# Patient Record
Sex: Female | Born: 1950 | Race: White | Hispanic: No | Marital: Married | Smoking: Never smoker
Health system: Southern US, Community
[De-identification: ages and names within clinical notes are randomized; demographics above are authoritative.]

## PROBLEM LIST (undated history)

## (undated) DIAGNOSIS — I517 Cardiomegaly: Secondary | ICD-10-CM

## (undated) DIAGNOSIS — R002 Palpitations: Secondary | ICD-10-CM

## (undated) DIAGNOSIS — R011 Cardiac murmur, unspecified: Secondary | ICD-10-CM

## (undated) DIAGNOSIS — F329 Major depressive disorder, single episode, unspecified: Secondary | ICD-10-CM

## (undated) DIAGNOSIS — Z8249 Family history of ischemic heart disease and other diseases of the circulatory system: Secondary | ICD-10-CM

## (undated) DIAGNOSIS — E785 Hyperlipidemia, unspecified: Secondary | ICD-10-CM

## (undated) DIAGNOSIS — S62101A Fracture of unspecified carpal bone, right wrist, initial encounter for closed fracture: Secondary | ICD-10-CM

## (undated) DIAGNOSIS — I341 Nonrheumatic mitral (valve) prolapse: Secondary | ICD-10-CM

## (undated) DIAGNOSIS — Z803 Family history of malignant neoplasm of breast: Secondary | ICD-10-CM

## (undated) DIAGNOSIS — M8580 Other specified disorders of bone density and structure, unspecified site: Secondary | ICD-10-CM

## (undated) DIAGNOSIS — M419 Scoliosis, unspecified: Secondary | ICD-10-CM

## (undated) DIAGNOSIS — Z923 Personal history of irradiation: Secondary | ICD-10-CM

## (undated) DIAGNOSIS — T7840XA Allergy, unspecified, initial encounter: Secondary | ICD-10-CM

## (undated) DIAGNOSIS — Q676 Pectus excavatum: Secondary | ICD-10-CM

## (undated) DIAGNOSIS — C50919 Malignant neoplasm of unspecified site of unspecified female breast: Secondary | ICD-10-CM

## (undated) HISTORY — DX: Cardiac murmur, unspecified: R01.1

## (undated) HISTORY — DX: Family history of malignant neoplasm of breast: Z80.3

## (undated) HISTORY — DX: Nonrheumatic mitral (valve) prolapse: I34.1

## (undated) HISTORY — DX: Other specified disorders of bone density and structure, unspecified site: M85.80

## (undated) HISTORY — DX: Personal history of irradiation: Z92.3

## (undated) HISTORY — DX: Allergy, unspecified, initial encounter: T78.40XA

## (undated) HISTORY — DX: Pectus excavatum: Q67.6

## (undated) HISTORY — DX: Palpitations: R00.2

## (undated) HISTORY — DX: Fracture of unspecified carpal bone, right wrist, initial encounter for closed fracture: S62.101A

## (undated) HISTORY — DX: Cardiomegaly: I51.7

## (undated) HISTORY — DX: Hyperlipidemia, unspecified: E78.5

## (undated) HISTORY — DX: Malignant neoplasm of unspecified site of unspecified female breast: C50.919

## (undated) HISTORY — PX: SPINE SURGERY: SHX786

## (undated) HISTORY — DX: Family history of ischemic heart disease and other diseases of the circulatory system: Z82.49

## (undated) HISTORY — DX: Major depressive disorder, single episode, unspecified: F32.9

## (undated) HISTORY — DX: Scoliosis, unspecified: M41.9

## (undated) HISTORY — PX: OTHER SURGICAL HISTORY: SHX169

---

## 1979-06-15 DIAGNOSIS — Z9289 Personal history of other medical treatment: Secondary | ICD-10-CM | POA: Insufficient documentation

## 1979-06-15 HISTORY — DX: Personal history of other medical treatment: Z92.89

## 1991-06-15 HISTORY — PX: TUBAL LIGATION: SHX77

## 1998-09-16 ENCOUNTER — Other Ambulatory Visit: Admission: RE | Admit: 1998-09-16 | Discharge: 1998-09-16 | Payer: Self-pay | Admitting: *Deleted

## 1998-12-12 ENCOUNTER — Other Ambulatory Visit: Admission: RE | Admit: 1998-12-12 | Discharge: 1998-12-12 | Payer: Self-pay | Admitting: *Deleted

## 1999-10-13 ENCOUNTER — Other Ambulatory Visit: Admission: RE | Admit: 1999-10-13 | Discharge: 1999-10-13 | Payer: Self-pay | Admitting: *Deleted

## 1999-12-21 ENCOUNTER — Other Ambulatory Visit: Admission: RE | Admit: 1999-12-21 | Discharge: 1999-12-21 | Payer: Self-pay | Admitting: *Deleted

## 2000-01-07 ENCOUNTER — Other Ambulatory Visit: Admission: RE | Admit: 2000-01-07 | Discharge: 2000-01-07 | Payer: Self-pay | Admitting: *Deleted

## 2000-04-25 ENCOUNTER — Other Ambulatory Visit: Admission: RE | Admit: 2000-04-25 | Discharge: 2000-04-25 | Payer: Self-pay | Admitting: *Deleted

## 2000-10-26 ENCOUNTER — Other Ambulatory Visit: Admission: RE | Admit: 2000-10-26 | Discharge: 2000-10-26 | Payer: Self-pay | Admitting: *Deleted

## 2000-12-05 ENCOUNTER — Encounter (INDEPENDENT_AMBULATORY_CARE_PROVIDER_SITE_OTHER): Payer: Self-pay

## 2000-12-05 ENCOUNTER — Other Ambulatory Visit: Admission: RE | Admit: 2000-12-05 | Discharge: 2000-12-05 | Payer: Self-pay | Admitting: *Deleted

## 2001-10-30 ENCOUNTER — Other Ambulatory Visit: Admission: RE | Admit: 2001-10-30 | Discharge: 2001-10-30 | Payer: Self-pay | Admitting: *Deleted

## 2002-10-22 ENCOUNTER — Other Ambulatory Visit: Admission: RE | Admit: 2002-10-22 | Discharge: 2002-10-22 | Payer: Self-pay | Admitting: *Deleted

## 2003-11-14 ENCOUNTER — Other Ambulatory Visit: Admission: RE | Admit: 2003-11-14 | Discharge: 2003-11-14 | Payer: Self-pay | Admitting: *Deleted

## 2004-12-14 ENCOUNTER — Other Ambulatory Visit: Admission: RE | Admit: 2004-12-14 | Discharge: 2004-12-14 | Payer: Self-pay | Admitting: *Deleted

## 2005-06-14 HISTORY — PX: CERVICAL DISC SURGERY: SHX588

## 2005-06-14 HISTORY — PX: OTHER SURGICAL HISTORY: SHX169

## 2006-01-27 ENCOUNTER — Other Ambulatory Visit: Admission: RE | Admit: 2006-01-27 | Discharge: 2006-01-27 | Payer: Self-pay | Admitting: *Deleted

## 2007-02-28 ENCOUNTER — Other Ambulatory Visit: Admission: RE | Admit: 2007-02-28 | Discharge: 2007-02-28 | Payer: Self-pay | Admitting: *Deleted

## 2011-06-15 HISTORY — PX: COLONOSCOPY: SHX174

## 2012-12-07 ENCOUNTER — Encounter: Payer: Self-pay | Admitting: *Deleted

## 2012-12-11 ENCOUNTER — Ambulatory Visit (INDEPENDENT_AMBULATORY_CARE_PROVIDER_SITE_OTHER): Payer: BC Managed Care – PPO | Admitting: Certified Nurse Midwife

## 2012-12-11 ENCOUNTER — Encounter: Payer: Self-pay | Admitting: Certified Nurse Midwife

## 2012-12-11 VITALS — BP 98/60 | HR 60 | Resp 16 | Ht 67.75 in | Wt 146.0 lb

## 2012-12-11 DIAGNOSIS — Z0000 Encounter for general adult medical examination without abnormal findings: Secondary | ICD-10-CM

## 2012-12-11 DIAGNOSIS — N63 Unspecified lump in unspecified breast: Secondary | ICD-10-CM

## 2012-12-11 DIAGNOSIS — Z01419 Encounter for gynecological examination (general) (routine) without abnormal findings: Secondary | ICD-10-CM

## 2012-12-11 LAB — POCT URINALYSIS DIPSTICK
Bilirubin, UA: NEGATIVE
Blood, UA: NEGATIVE
Nitrite, UA: NEGATIVE
Protein, UA: NEGATIVE
pH, UA: 5

## 2012-12-11 NOTE — Patient Instructions (Addendum)

## 2012-12-11 NOTE — Progress Notes (Signed)
62 y.o. [id number] Married Caucasian Fe here for annual exam.  Menopausal no HRT. Denies vaginal bleeding. Has noticed vaginal dryness has increased, using OTC lubrication. Saw cardiologist 2/14, MVP has not changed, 2-3 year follow-up suggested. Plans PCP visit for labs and aex.  No health issues today, except has area on Left breast that she has noticed over the past 2 months. Had mammogram in 2/14, negative per patient. Denies nipple discharge or skin change.  Patient's last menstrual period was 06/15/2003.          Sexually active: yes  The current method of family planning is tubal ligation.    Exercising: yes  walking & weight training Smoker:  no  Health Maintenance: Pap: 12-09-11 neg HPV HR neg MMG:  2/14 Colonoscopy:  6/13 BMD:   2010 TDaP:  2011 Labs: Poct urine-neg, hgb-14.3 Self breast exam: monthly   reports that she has never smoked. She does not have any smokeless tobacco history on file. She reports that she drinks about 3.6 ounces of alcohol per week. She reports that she does not use illicit drugs.  History reviewed. No pertinent past medical history.  History reviewed. No pertinent past surgical history.  Current Outpatient Prescriptions  Medication Sig Dispense Refill  . CALCIUM PO Take by mouth daily.      . Cholecalciferol (VITAMIN D PO) Take by mouth daily.      . Coenzyme Q10 (CO Q 10 PO) Take by mouth daily.      Marland Kitchen LORazepam (ATIVAN) 0.5 MG tablet as needed. Take 1/2 prn      . Multiple Vitamins-Minerals (MULTIVITAMIN PO) Take by mouth every other day.      . Omega-3 Fatty Acids (FISH OIL PO) Take by mouth.       No current facility-administered medications for this visit.    Family History  Problem Relation Age of Onset  . Hypertension Mother   . Mitral valve prolapse Mother   . Hypertension Father     ROS:  Pertinent items are noted in HPI.  Otherwise, a comprehensive ROS was negative.  Exam:   BP 98/60  Pulse 60  Resp 16  Ht 5' 7.75" (1.721 m)   Wt 146 lb (66.225 kg)  BMI 22.36 kg/m2  LMP 06/15/2003 Height: 5' 7.75" (172.1 cm)  Ht Readings from Last 3 Encounters:  12/11/12 5' 7.75" (1.721 m)    General appearance: alert, cooperative and appears stated age Head: Normocephalic, without obvious abnormality, atraumatic Neck: no adenopathy, supple, symmetrical, trachea midline and thyroid normal to inspection and palpation Lungs: clear to auscultation bilaterally Breasts: normal appearance, no masses or tenderness, No nipple retraction or dimpling, No nipple discharge or bleeding, No axillary or supraclavicular adenopathy on right, left breast no nipple discharge or skin change, 1 cm mobile soft mass noted @ 7 o'clock at 3 fb from areola and at breast edge also, non tender, cystic feel. No enlarge lymph nodes noted. Area also examined by Dr. Edward Jolly agreed with finding and need for evaluation with diagnostic mammogram and [place]. Heart: regular rate and rhythm Abdomen: soft, non-tender; no masses,  no organomegaly Extremities: extremities normal, atraumatic, no cyanosis or edema Skin: Skin color, texture, turgor normal. No rashes or lesions Lymph nodes: Cervical, supraclavicular, and axillary nodes normal. No abnormal inguinal nodes palpated Neurologic: Grossly normal   Pelvic: External genitalia:  no lesions              Urethra:  normal appearing urethra with no masses, tenderness or lesions  Bartholin's and Skene's: normal                 Vagina: normal appearing vagina with normal color and discharge, no lesions              Cervix: normal, non tender              Pap taken: no Bimanual Exam:  Uterus:  normal size, contour, position, consistency, mobility, non-tender and anteflexed              Adnexa: normal adnexa and no mass, fullness, tenderness               Rectovaginal: Confirms               Anus:  normal sphincter tone, no lesions  A:  Well Woman with normal exam  Menopausal,no HRT  Left breast mass ?  Cyst  History of MVP with recent evaluation no change   P:   Reviewed health and wellness pertinent to exam  Aware of need to evaluate if vaginal bleeding occurs.  Discussed need for diagnostic mammogram of left breast with [place], patient agreeable , order in, patient scheduled 12-12-12 @ 9:30, patient aware  Pap smear as per guidelines   mammogram pap smear not taken today  counseled on breast self exam, mammography screening, adequate intake of calcium and vitamin D, diet and exercise, Kegel's exercises  return annually or prn  An After Visit Summary was printed and given to the patient.

## 2012-12-12 NOTE — Progress Notes (Signed)
Reviewed CPRomine, MD 

## 2012-12-13 ENCOUNTER — Telehealth: Payer: Self-pay | Admitting: *Deleted

## 2012-12-13 NOTE — Telephone Encounter (Signed)
Form on your desk to sign for [facility] for  BMD test/sue

## 2012-12-14 ENCOUNTER — Other Ambulatory Visit: Payer: Self-pay | Admitting: Certified Nurse Midwife

## 2012-12-14 DIAGNOSIS — Z780 Asymptomatic menopausal state: Secondary | ICD-10-CM

## 2012-12-14 NOTE — Telephone Encounter (Signed)
Order faxed.

## 2012-12-14 NOTE — Telephone Encounter (Signed)
On your desk

## 2012-12-19 ENCOUNTER — Telehealth: Payer: Self-pay | Admitting: *Deleted

## 2012-12-19 NOTE — Telephone Encounter (Signed)
Per Dr. Edward Jolly mammogram out of hold and to be scanned into chart.

## 2013-04-19 ENCOUNTER — Other Ambulatory Visit: Payer: Self-pay

## 2013-05-16 ENCOUNTER — Telehealth: Payer: Self-pay

## 2013-05-16 NOTE — Telephone Encounter (Signed)
Called to give patient results for bmd. Pt stated she needed to call me back

## 2013-05-18 NOTE — Telephone Encounter (Signed)
Pt notified of BMD results. See scanned results in epic

## 2013-07-15 DIAGNOSIS — S62101A Fracture of unspecified carpal bone, right wrist, initial encounter for closed fracture: Secondary | ICD-10-CM

## 2013-07-15 HISTORY — DX: Fracture of unspecified carpal bone, right wrist, initial encounter for closed fracture: S62.101A

## 2013-12-12 ENCOUNTER — Ambulatory Visit: Payer: BC Managed Care – PPO | Admitting: Certified Nurse Midwife

## 2013-12-12 ENCOUNTER — Encounter: Payer: Self-pay | Admitting: Certified Nurse Midwife

## 2014-01-07 ENCOUNTER — Encounter: Payer: Self-pay | Admitting: Certified Nurse Midwife

## 2014-01-07 ENCOUNTER — Ambulatory Visit (INDEPENDENT_AMBULATORY_CARE_PROVIDER_SITE_OTHER): Payer: BC Managed Care – PPO | Admitting: Certified Nurse Midwife

## 2014-01-07 ENCOUNTER — Telehealth: Payer: Self-pay | Admitting: Certified Nurse Midwife

## 2014-01-07 VITALS — BP 104/70 | HR 60 | Ht 67.75 in | Wt 145.0 lb

## 2014-01-07 DIAGNOSIS — Z124 Encounter for screening for malignant neoplasm of cervix: Secondary | ICD-10-CM

## 2014-01-07 DIAGNOSIS — Z01419 Encounter for gynecological examination (general) (routine) without abnormal findings: Secondary | ICD-10-CM

## 2014-01-07 DIAGNOSIS — Z0000 Encounter for general adult medical examination without abnormal findings: Secondary | ICD-10-CM

## 2014-01-07 LAB — POCT URINALYSIS DIPSTICK
BILIRUBIN UA: NEGATIVE
Blood, UA: NEGATIVE
Glucose, UA: NEGATIVE
KETONES UA: NEGATIVE
LEUKOCYTES UA: NEGATIVE
NITRITE UA: NEGATIVE
PH UA: 6
PROTEIN UA: NEGATIVE
Urobilinogen, UA: NEGATIVE

## 2014-01-07 LAB — LIPID PANEL
Cholesterol: 171 mg/dL (ref 0–200)
HDL: 68 mg/dL (ref >39)
LDL CALC: 89 mg/dL (ref 0–99)
Total CHOL/HDL Ratio: 2.5 Ratio
Triglycerides: 70 mg/dL (ref <150)
VLDL: 14 mg/dL (ref 0–40)

## 2014-01-07 LAB — HEMOGLOBIN, FINGERSTICK: HEMOGLOBIN, FINGERSTICK: 15.9 g/dL (ref 12.0–16.0)

## 2014-01-07 NOTE — Progress Notes (Signed)
63 y.o. [id number] Married Caucasian Fe here for annual exam.  Menopausal,no HRT. Denies vaginal bleeding or vaginal dryness. Sees Dr. Nyra Capes as PCP for aex and labs.Fasted for lipid panel. No health changes in past year. New grandchild! No health concerns today.   Patient's last menstrual period was 06/15/2003.          Sexually active: Yes.    The current method of family planning is post menopausal status.    Exercising: Yes.    walking, swimming and stretching Smoker:  no  Health Maintenance: Pap:  12/09/11 neg HPV HR neg MMG:  10/10/13 BI-RADS Neg Category B Colonoscopy:  6/13 per pt negative 10 years BMD:   2010 TDaP:  2011 Labs: hgb-15.9   UA- neg.   reports that she has never smoked. She does not have any smokeless tobacco history on file. She reports that she drinks about 3.6 - 4.2 ounces of alcohol per week. She reports that she does not use illicit drugs.  Past Medical History  Diagnosis Date  . MVP (mitral valve prolapse)   . Wrist fracture, right 07/2013    slipped on ice    Past Surgical History  Procedure Laterality Date  . Tubal ligation  1993  . Cervical disc surgery  2007    Current Outpatient Prescriptions  Medication Sig Dispense Refill  . CALCIUM PO Take by mouth daily.      . Cholecalciferol (VITAMIN D PO) Take by mouth daily.      . Coenzyme Q10 (CO Q 10 PO) Take by mouth daily.      Marland Kitchen LORazepam (ATIVAN) 0.5 MG tablet as needed. Take 1/2 prn      . Multiple Vitamins-Minerals (MULTIVITAMIN PO) Take by mouth every other day.      . Omega-3 Fatty Acids (FISH OIL PO) Take by mouth.       No current facility-administered medications for this visit.    Family History  Problem Relation Age of Onset  . Hypertension Mother   . Mitral valve prolapse Mother   . Hypertension Father   . Diabetes Maternal Grandmother   . Diabetes Paternal Grandfather     ROS:  Pertinent items are noted in HPI.  Otherwise, a comprehensive ROS was negative.  Exam:   BP 104/70   Pulse 60  Ht 5' 7.75" (1.721 m)  Wt 145 lb (65.772 kg)  BMI 22.21 kg/m2  LMP 06/15/2003 Height: 5' 7.75" (172.1 cm)  Ht Readings from Last 3 Encounters:  01/07/14 5' 7.75" (1.721 m)  12/11/12 5' 7.75" (1.721 m)    General appearance: alert, cooperative and appears stated age Head: Normocephalic, without obvious abnormality, atraumatic Neck: no adenopathy, supple, symmetrical, trachea midline and thyroid normal to inspection and palpation and non-palpable Lungs: clear to auscultation bilaterally Breasts: normal appearance, no masses or tenderness, No nipple retraction or dimpling, No nipple discharge or bleeding, No axillary or supraclavicular adenopathy Heart: regular rate and rhythm Abdomen: soft, non-tender; no masses,  no organomegaly Extremities: extremities normal, atraumatic, no cyanosis or edema Skin: Skin color, texture, turgor normal. No rashes or lesions Lymph nodes: Cervical, supraclavicular, and axillary nodes normal. No abnormal inguinal nodes palpated Neurologic: Grossly normal   Pelvic: External genitalia:  no lesions              Urethra:  normal appearing urethra with no masses, tenderness or lesions              Bartholin's and Skene's: normal  Vagina: normal appearing vagina with normal color and discharge, no lesions              Cervix: normal, non tender no lesions              Pap taken: Yes.   Bimanual Exam:  Uterus:  normal size, contour, position, consistency, mobility, non-tender and anteverted              Adnexa: normal adnexa and no mass, fullness, tenderness               Rectovaginal: Confirms               Anus:  normal sphincter tone, no lesions  A:  Well Woman with normal exam  Menopausal no HRT  Fasting Lipid Panel    P:   Reviewed health and wellness pertinent to exam  Aware of need to evaluate in vaginal bleeding  Pap smear taken today with HPV reflex  Lab Lipid panel   counseled on breast self exam, mammography  screening, adequate intake of calcium and vitamin D, diet and exercise  return annually or prn  An After Visit Summary was printed and given to the patient.

## 2014-01-07 NOTE — Patient Instructions (Signed)

## 2014-01-07 NOTE — Telephone Encounter (Signed)
This patient requests we waived the [facility] fee for [facility] 12/12/13. She did get a reminder call that left a message on her answering machine but stated she didn't. Patient declined to pay the fee at her appointment today.

## 2014-01-07 NOTE — Telephone Encounter (Signed)
Notified patient. She is "grateful" and aware will not waive the fee again.

## 2014-01-07 NOTE — Progress Notes (Signed)
Reviewed personally.  M. Suzanne Shai Mckenzie, MD.  

## 2014-01-07 NOTE — Telephone Encounter (Signed)
We can waive this one time only. Make sure she is aware it cannot be waived in the future.   Thanks,

## 2014-01-09 LAB — IPS PAP TEST WITH REFLEX TO HPV

## 2014-01-21 ENCOUNTER — Encounter: Payer: Self-pay | Admitting: Certified Nurse Midwife

## 2014-04-15 ENCOUNTER — Encounter: Payer: Self-pay | Admitting: Certified Nurse Midwife

## 2014-12-26 DIAGNOSIS — R002 Palpitations: Secondary | ICD-10-CM | POA: Insufficient documentation

## 2014-12-26 HISTORY — DX: Palpitations: R00.2

## 2014-12-27 DIAGNOSIS — I517 Cardiomegaly: Secondary | ICD-10-CM | POA: Insufficient documentation

## 2014-12-27 HISTORY — DX: Cardiomegaly: I51.7

## 2015-02-26 ENCOUNTER — Encounter: Payer: Self-pay | Admitting: Certified Nurse Midwife

## 2015-02-26 ENCOUNTER — Ambulatory Visit (INDEPENDENT_AMBULATORY_CARE_PROVIDER_SITE_OTHER): Payer: BC Managed Care – PPO | Admitting: Certified Nurse Midwife

## 2015-02-26 VITALS — BP 96/62 | HR 68 | Resp 16 | Ht 67.75 in | Wt 146.0 lb

## 2015-02-26 DIAGNOSIS — Z0000 Encounter for general adult medical examination without abnormal findings: Secondary | ICD-10-CM

## 2015-02-26 DIAGNOSIS — Z01419 Encounter for gynecological examination (general) (routine) without abnormal findings: Secondary | ICD-10-CM

## 2015-02-26 LAB — POCT URINALYSIS DIPSTICK
BILIRUBIN UA: NEGATIVE
Blood, UA: NEGATIVE
GLUCOSE UA: NEGATIVE
Ketones, UA: NEGATIVE
Leukocytes, UA: NEGATIVE
NITRITE UA: NEGATIVE
Protein, UA: NEGATIVE
UROBILINOGEN UA: NEGATIVE
pH, UA: 5

## 2015-02-26 NOTE — Patient Instructions (Signed)

## 2015-02-26 NOTE — Progress Notes (Signed)
64 y.o. [id number] Married  Caucasian Fe here for annual exam.Menopausal no HRT. Patient has been treated for UTI in 8/16,02/15/15 and was treated at Urgent care. Patient had some slight pink after sexual activity only. Complaining of vaginal dryness. Sees Cardiology every 2 years with all labs, normal per patient( on care everywhere). Sees PCP prn. No other health concerns. . Patient's last menstrual period was 06/15/2003.          Sexually active: Yes.    The current method of family planning is tubal ligation.    Exercising: Yes.    walking & stretching Smoker:  no  Health Maintenance: Pap: 01-07-14 neg MMG:  10-08-14 category b density,birads 1:neg Colonoscopy:  2013 neg per patient f/u30yrs BMD:   2014 TDaP:  2011 Labs: poct urine-neg Self breast exam: done monthly   reports that she has never smoked. She does not have any smokeless tobacco history on file. She reports that she drinks about 2.4 oz of alcohol per week. She reports that she does not use illicit drugs.  Past Medical History  Diagnosis Date  . MVP (mitral valve prolapse)   . Wrist fracture, right 07/2013    slipped on ice    Past Surgical History  Procedure Laterality Date  . Tubal ligation  1993  . Cervical disc surgery  2007    Current Outpatient Prescriptions  Medication Sig Dispense Refill  . CALCIUM PO Take by mouth daily.    . Cholecalciferol (VITAMIN D PO) Take by mouth daily.    . Coenzyme Q10 (CO Q 10 PO) Take by mouth daily.    Marland Kitchen loratadine (CLARITIN) 10 MG tablet Take 10 mg by mouth daily.    Marland Kitchen LORazepam (ATIVAN) 0.5 MG tablet as needed. Take 1/2 prn    . Omega-3 Fatty Acids (FISH OIL PO) Take by mouth.     No current facility-administered medications for this visit.    Family History  Problem Relation Age of Onset  . Hypertension Mother   . Mitral valve prolapse Mother   . Hypertension Father   . Diabetes Maternal Grandmother   . Diabetes Paternal Grandfather     ROS:  Pertinent items are  noted in HPI.  Otherwise, a comprehensive ROS was negative.  Exam:   BP 96/62 mmHg  Pulse 68  Resp 16  Ht 5' 7.75" (1.721 m)  Wt 146 lb (66.225 kg)  BMI 22.36 kg/m2  LMP 06/15/2003 Height: 5' 7.75" (172.1 cm) Ht Readings from Last 3 Encounters:  02/26/15 5' 7.75" (1.721 m)  01/07/14 5' 7.75" (1.721 m)  12/11/12 5' 7.75" (1.721 m)    General appearance: alert, cooperative and appears stated age Head: Normocephalic, without obvious abnormality, atraumatic Neck: no adenopathy, supple, symmetrical, trachea midline and thyroid normal to inspection and palpation Lungs: clear to auscultation bilaterally Breasts: normal appearance, no masses or tenderness, No nipple retraction or dimpling, No nipple discharge or bleeding, No axillary or supraclavicular adenopathy Heart: regular rate and rhythm Abdomen: soft, non-tender; no masses,  no organomegaly Extremities: extremities normal, atraumatic, no cyanosis or edema Skin: Skin color, texture, turgor normal. No rashes or lesions Lymph nodes: Cervical, supraclavicular, and axillary nodes normal. No abnormal inguinal nodes palpated Neurologic: Grossly normal   Pelvic: External genitalia:  no lesions              Urethra:  normal appearing urethra with no masses, tenderness or lesions              Bartholin's and Skene's:  normal                 Vagina: normal appearing vagina with normal color and discharge, no lesions              Cervix: non tender,no lesions, normal              Pap taken: no Bimanual Exam:  Uterus:  normal size, contour, position, consistency, mobility, non-tender              Adnexa: normal adnexa and no mass, fullness, tenderness               Rectovaginal: Confirms               Anus:  normal sphincter tone, no lesions  Chaperone present: yes  A:  Well Woman with normal exam  Menopausal no HRT  Atrophic vaginitis    P:   Reviewed health and wellness pertinent to exam   Aware of need to advise if vaginal  bleeding  Discussed finding of atrophic vaginitis and etiology and options for treatment, risks and benefits of estrogen. Patient declines estrogen. Will try Coconut oil daily with instructions given for use. Will recheck in one month.  Pap smear as above not done  counseled on breast self exam, mammography screening, adequate intake of calcium and vitamin D, diet and exercise  return annually or prn  An After Visit Summary was printed and given to the patient.

## 2015-02-27 ENCOUNTER — Encounter: Payer: Self-pay | Admitting: Certified Nurse Midwife

## 2015-02-27 ENCOUNTER — Telehealth: Payer: Self-pay

## 2015-02-27 NOTE — Telephone Encounter (Signed)
Order for BMD has been faxed to [facility] with cover sheet and confirmation. Mychart message sent back to notify her that this has been done.  Routing to provider for final review. Patient agreeable to disposition. Will close encounter.

## 2015-02-27 NOTE — Telephone Encounter (Signed)
Order for BMD faxed to [facility] at [phone] with cover sheet and confirmation. Mychart message sent to patient to notify this has been performed.  Routing to provider for final review. Patient agreeable to disposition. Will close encounter.

## 2015-02-27 NOTE — Telephone Encounter (Signed)
Telephone encounter created regarding patient's mychart message.

## 2015-02-27 NOTE — Telephone Encounter (Signed)
Order for BMD to Kelli Saunders CNM for review and signature before fax.

## 2015-02-27 NOTE — Progress Notes (Signed)
Reviewed personally.  M. Suzanne Tkeya Stencil, MD.  

## 2015-02-27 NOTE — Telephone Encounter (Signed)
Non-Urgent Medical Question  Message [id number]   From  [place], CNM   Sent  02/27/2015 2:42 PM     Kelli Saunders, I made an appointment for a bone scan at [facility] 03/28/2015. The scheduler said you would need to send a referral or requisition to them for the scan. Thank you, Kelli Saunders      Responsible Party    Pool - [facility] Clinical Pool No one has taken responsibility for this message.     No actions have been taken on this message.

## 2015-03-28 ENCOUNTER — Ambulatory Visit (INDEPENDENT_AMBULATORY_CARE_PROVIDER_SITE_OTHER): Payer: BC Managed Care – PPO | Admitting: Certified Nurse Midwife

## 2015-03-28 ENCOUNTER — Encounter: Payer: Self-pay | Admitting: Certified Nurse Midwife

## 2015-03-28 VITALS — BP 102/64 | HR 76 | Resp 16 | Ht 67.75 in | Wt 143.0 lb

## 2015-03-28 DIAGNOSIS — N951 Menopausal and female climacteric states: Secondary | ICD-10-CM | POA: Diagnosis not present

## 2015-03-28 NOTE — Progress Notes (Signed)
64 y.o. Married Caucasian female [phone] here for follow up of vaginal dryness treated with coconut oil initiated on 02/26/15. Patient was having vaginal dryness with UTI occurrence. Started using coconut oil 1-2 times daily with total relief of dryness and no UTI symptoms. "Feels so much better". Denies any urinary frequency or urgency or pain with urination now. No vaginal itching or redness as noted at last exam. She has been observing the change.  O: Healthy WD,WN female Affect: normal orientation x 3  Abdomen:soft, non tender Pelvic exam:EXTERNAL GENITALIA: normal appearing vulva with no masses, tenderness or lesions, no redness or cracking of skin noted now VAGINA: no abnormal discharge or lesions or redness and moist, non tender with speculum insertion  CERVIX: no lesions or cervical motion tenderness and normal appearance Declines pelvic exam  A:Vaginal Dryness  Resolved with Coconut oil use No UTI symptoms today Normal exam    P: Discussed findings of tissue and shown to patient. Improved with no tenderness noted and encouraged to continue current plan of daily use. She also has eliminated wiping after urination, but patting now, which is working well. Instructed to call if concerns or change. Questions addressed.   RV prn

## 2015-04-02 NOTE — Progress Notes (Signed)
Encounter reviewed Jill Jertson, MD   

## 2015-04-08 ENCOUNTER — Telehealth: Payer: Self-pay

## 2015-04-08 ENCOUNTER — Other Ambulatory Visit: Payer: Self-pay | Admitting: Certified Nurse Midwife

## 2015-04-08 DIAGNOSIS — M8580 Other specified disorders of bone density and structure, unspecified site: Secondary | ICD-10-CM

## 2015-04-08 NOTE — Telephone Encounter (Signed)
lmtcb

## 2015-04-08 NOTE — Telephone Encounter (Signed)
Lab appt scheduled for vit d recheck at 3pm on 04-15-15. Please place order for vit d recheck. Close encounter when done.

## 2015-04-15 ENCOUNTER — Other Ambulatory Visit (INDEPENDENT_AMBULATORY_CARE_PROVIDER_SITE_OTHER): Payer: BC Managed Care – PPO

## 2015-04-15 DIAGNOSIS — M8580 Other specified disorders of bone density and structure, unspecified site: Secondary | ICD-10-CM

## 2015-04-16 LAB — VITAMIN D 25 HYDROXY (VIT D DEFICIENCY, FRACTURES): VIT D 25 HYDROXY: 29 ng/mL — AB (ref 30–100)

## 2015-09-23 ENCOUNTER — Telehealth: Payer: Self-pay | Admitting: Emergency Medicine

## 2015-09-23 ENCOUNTER — Encounter: Payer: Self-pay | Admitting: Certified Nurse Midwife

## 2015-09-23 NOTE — Telephone Encounter (Signed)
Mychart message sent to patient.

## 2015-09-23 NOTE — Telephone Encounter (Signed)
Routing mychart message to Melvia Heaps CNM to review and advise.

## 2015-09-23 NOTE — Telephone Encounter (Signed)
She can use Replens OTC, but will need to do daily at hs. I would still take coconut oil with her if she has problems and also to prevent UTI .

## 2015-09-23 NOTE — Telephone Encounter (Signed)
Chief Complaint  Patient presents with  . Advice Only    Patient sent mychart message     ===View-only below this line===   ----- Message -----    From: Rullo,Kelli A    Sent: 09/23/2015  8:37 AM EDT      To: Melvia Heaps, CNM Subject: Non-Urgent Medical Question  Kelli Saunders, I will be in the [place] for most of June on a [place]. I have had good results using the coconut oil daily; would it be possible for me to use a more "Travel Friendly" form of lubrication for the month of June? You mentioned a once a week capsule which can provide the same results. If this is a prescription, my pharmacy is [facility] on [address] in [place]-         [phone]. Thank you for your consult. Kelli Saunders

## 2015-09-23 NOTE — Telephone Encounter (Signed)
Opened telephone encounter and routed message to Melvia Heaps CNM

## 2016-03-03 ENCOUNTER — Encounter: Payer: Self-pay | Admitting: Certified Nurse Midwife

## 2016-03-03 ENCOUNTER — Ambulatory Visit (INDEPENDENT_AMBULATORY_CARE_PROVIDER_SITE_OTHER): Payer: BC Managed Care – PPO | Admitting: Certified Nurse Midwife

## 2016-03-03 VITALS — BP 108/72 | HR 72 | Resp 16 | Ht 68.0 in | Wt 146.0 lb

## 2016-03-03 DIAGNOSIS — Z0000 Encounter for general adult medical examination without abnormal findings: Secondary | ICD-10-CM | POA: Diagnosis not present

## 2016-03-03 DIAGNOSIS — Z01419 Encounter for gynecological examination (general) (routine) without abnormal findings: Secondary | ICD-10-CM

## 2016-03-03 DIAGNOSIS — Z124 Encounter for screening for malignant neoplasm of cervix: Secondary | ICD-10-CM | POA: Diagnosis not present

## 2016-03-03 LAB — CBC
HCT: 47 % — ABNORMAL HIGH (ref 35.0–45.0)
Hemoglobin: 15.7 g/dL — ABNORMAL HIGH (ref 11.7–15.5)
MCH: 30.2 pg (ref 27.0–33.0)
MCHC: 33.4 g/dL (ref 32.0–36.0)
MCV: 90.4 fL (ref 80.0–100.0)
MPV: 9.9 fL (ref 7.5–12.5)
PLATELETS: 219 10*3/uL (ref 140–400)
RBC: 5.2 MIL/uL — ABNORMAL HIGH (ref 3.80–5.10)
RDW: 13.6 % (ref 11.0–15.0)
WBC: 6 10*3/uL (ref 3.8–10.8)

## 2016-03-03 LAB — POCT URINALYSIS DIPSTICK
Bilirubin, UA: NEGATIVE
Blood, UA: NEGATIVE
GLUCOSE UA: NEGATIVE
Ketones, UA: NEGATIVE
Nitrite, UA: NEGATIVE
Protein, UA: NEGATIVE
UROBILINOGEN UA: NEGATIVE
pH, UA: 5

## 2016-03-03 LAB — LIPID PANEL
CHOL/HDL RATIO: 2.4 ratio (ref <=5.0)
Cholesterol: 186 mg/dL (ref 125–200)
HDL: 78 mg/dL (ref >=46)
LDL Cholesterol: 97 mg/dL (ref <130)
TRIGLYCERIDES: 54 mg/dL (ref <150)
VLDL: 11 mg/dL (ref <30)

## 2016-03-03 LAB — COMPREHENSIVE METABOLIC PANEL
ALT: 12 U/L (ref 6–29)
AST: 23 U/L (ref 10–35)
Albumin: 4.4 g/dL (ref 3.6–5.1)
Alkaline Phosphatase: 59 U/L (ref 33–130)
BUN: 13 mg/dL (ref 7–25)
CALCIUM: 9.4 mg/dL (ref 8.6–10.4)
CHLORIDE: 103 mmol/L (ref 98–110)
CO2: 26 mmol/L (ref 20–31)
CREATININE: 0.76 mg/dL (ref 0.50–0.99)
Glucose, Bld: 91 mg/dL (ref 65–99)
POTASSIUM: 4.3 mmol/L (ref 3.5–5.3)
SODIUM: 139 mmol/L (ref 135–146)
Total Bilirubin: 1 mg/dL (ref 0.2–1.2)
Total Protein: 6.7 g/dL (ref 6.1–8.1)

## 2016-03-03 LAB — TSH: TSH: 3.16 m[IU]/L

## 2016-03-03 NOTE — Patient Instructions (Signed)

## 2016-03-03 NOTE — Progress Notes (Signed)
65 y.o. [id number] Married  Caucasian Fe here for annual exam. Menopausal no HRT. Denies vaginal bleeding or vaginal dryness. Coconut oil working well for dryness. Occasional pink discharge after sexual activity if no coconut oil use. Sees Greer Pickerel PCP prn. Screening labs desired. Sees Cardiology for MVP and medication use. Caring for aging parents. Dad has Alzheimers, mother rehab from hip fracture. Emotionally doing well. No other health issues today. Took trip to [place] and [place]!      Patient's last menstrual period was 06/15/2003.          Sexually active: Yes.    The current method of family planning is tubal ligation.    Exercising: Yes.    walking, stretching, strength training Smoker:  no  Health Maintenance: Pap:  01-07-14 neg MMG:  10-09-15 category b density birads 1:neg Colonoscopy:  2013 neg f/u 46yrs per patient BMD:   2016 osteopenia TDaP:  2011 Shingles :2013 Pneumonia: no Hep C and HIV: HIV neg 55yrs ago Labs: poct urine-tr Self breast exam: done monthly   reports that she has never smoked. She does not have any smokeless tobacco history on file. She reports that she drinks about 2.4 oz of alcohol per week . She reports that she does not use drugs.  Past Medical History:  Diagnosis Date  . MVP (mitral valve prolapse)   . Wrist fracture, right 07/2013   slipped on ice    Past Surgical History:  Procedure Laterality Date  . [place] SURGERY  2007  . TUBAL LIGATION  1993    Current Outpatient Prescriptions  Medication Sig Dispense Refill  . azithromycin (ZITHROMAX) 500 MG tablet Take 500 mg by mouth daily.  0  . CALCIUM PO Take by mouth daily.    . Cholecalciferol (VITAMIN D PO) Take by mouth daily.    . ciprofloxacin (CILOXAN) 0.3 % ophthalmic solution instill 2 drops into affected eye four times a day  0  . Coenzyme Q10 (CO Q 10 PO) Take by mouth daily.    Marland Kitchen loratadine (CLARITIN) 10 MG tablet Take 10 mg by mouth daily.    Marland Kitchen LORazepam (ATIVAN) 0.5 MG  tablet as needed. Take 1/2 prn    . Omega-3 Fatty Acids (FISH OIL PO) Take by mouth.     No current facility-administered medications for this visit.     Family History  Problem Relation Age of Onset  . Hypertension Mother   . Mitral valve prolapse Mother   . Hypertension Father   . Diabetes Maternal Grandmother   . Diabetes Paternal Grandfather     ROS:  Pertinent items are noted in HPI.  Otherwise, a comprehensive ROS was negative.  Exam:   Ht 5\' 8"  (1.727 m)   Wt 146 lb (66.2 kg)   LMP 06/15/2003   BMI 22.20 kg/m  Height: 5\' 8"  (172.7 cm) Ht Readings from Last 3 Encounters:  03/03/16 5\' 8"  (1.727 m)  03/28/15 5' 7.75" (1.721 m)  02/26/15 5' 7.75" (1.721 m)    General appearance: alert, cooperative and appears stated age Head: Normocephalic, without obvious abnormality, atraumatic Neck: no adenopathy, supple, symmetrical, trachea midline and thyroid normal to inspection and palpation Lungs: clear to auscultation bilaterally Breasts: normal appearance, no masses or tenderness, No nipple retraction or dimpling, No nipple discharge or bleeding, No axillary or supraclavicular adenopathy Heart: regular rate and rhythm Abdomen: soft, non-tender; no masses,  no organomegaly Extremities: extremities normal, atraumatic, no cyanosis or edema Skin: Skin color, texture, turgor normal. No  rashes or lesions Lymph nodes: Cervical, supraclavicular, and axillary nodes normal. No abnormal inguinal nodes palpated Neurologic: Grossly normal   Pelvic: External genitalia:  no lesions              Urethra:  normal appearing urethra with no masses, tenderness or lesions              Bartholin's and Skene's: normal                 Vagina: normal appearing vagina with normal color and discharge, no lesions              Cervix: no cervical motion tenderness, no lesions and normal appearance              Pap taken: Yes.   Bimanual Exam:  Uterus:  normal size, contour, position, consistency,  mobility, non-tender              Adnexa: normal adnexa and no mass, fullness, tenderness               Rectovaginal: Confirms               Anus:  normal sphincter tone, no lesions  Chaperone present: yes  A:  Well Woman with normal exam  Menopausal no HRT  Vaginal dryness coconut oil working well  MVP with cardiology follow up  Screening labs  P:   Reviewed health and wellness pertinent to exam  Aware of need to evaluate if vaginal bleeding  Continue follow up with MD as indicated  Lab: Vitamin D, TSH, Lipid panel, CMP, CBC, Hep C  Pap smear as above with HPVHR   counseled on breast self exam, mammography screening, menopause, adequate intake of calcium and vitamin D, diet and exercise, Kegel's exercises  return annually or prn  An After Visit Summary was printed and given to the patient.

## 2016-03-04 LAB — HEPATITIS C ANTIBODY: HCV AB: NEGATIVE

## 2016-03-04 LAB — VITAMIN D 25 HYDROXY (VIT D DEFICIENCY, FRACTURES): Vit D, 25-Hydroxy: 38 ng/mL (ref 30–100)

## 2016-03-05 LAB — IPS PAP TEST WITH HPV

## 2016-03-07 NOTE — Progress Notes (Signed)
Encounter reviewed Baylin Gamblin, MD   

## 2016-03-16 ENCOUNTER — Encounter: Payer: Self-pay | Admitting: Certified Nurse Midwife

## 2016-03-16 ENCOUNTER — Telehealth: Payer: Self-pay

## 2016-03-16 NOTE — Telephone Encounter (Signed)
Patient has vaginal dryness treating with coconut oil and this is not an usual finding . No indications of her changes. Continue coconut oil as discussed.

## 2016-03-16 NOTE — Telephone Encounter (Signed)
Routing to [employer] CNM for review and advise.  Visit Follow-Up Question  Message [phone]  From [place], CNM Sent 03/16/2016 12:34 PM  OTHER CYTOLOGIC FINDINGS:  Marked/Severe Inflammation.   Jackelyn Poling,  Is this a significant indicator reported as part of my recent 03/03/2016 Pap test ? Nanci Mclennan   Responsible Party   Pool - [facility] Clinical Pool No one has taken responsibility for this message.  No actions have been taken on this message.

## 2016-03-16 NOTE — Telephone Encounter (Signed)
Telephone encounter created to discuss MyChart message with Melvia Heaps CNM.

## 2016-03-17 NOTE — Telephone Encounter (Signed)
Spoke with patient. Advised of message as seen below from [place]. Patient is agreeable and verbalizes understanding.  Routing to provider for final review. Patient agreeable to disposition. Will close encounter.

## 2016-07-07 ENCOUNTER — Ambulatory Visit (INDEPENDENT_AMBULATORY_CARE_PROVIDER_SITE_OTHER): Payer: BC Managed Care – PPO | Admitting: Certified Nurse Midwife

## 2016-07-07 VITALS — BP 110/72 | HR 72 | Temp 97.8°F | Resp 16 | Ht 68.0 in | Wt 151.0 lb

## 2016-07-07 DIAGNOSIS — N390 Urinary tract infection, site not specified: Secondary | ICD-10-CM

## 2016-07-07 DIAGNOSIS — R3915 Urgency of urination: Secondary | ICD-10-CM | POA: Diagnosis not present

## 2016-07-07 DIAGNOSIS — N898 Other specified noninflammatory disorders of vagina: Secondary | ICD-10-CM | POA: Diagnosis not present

## 2016-07-07 LAB — POCT URINALYSIS DIPSTICK
Bilirubin, UA: NEGATIVE
GLUCOSE UA: NEGATIVE
Ketones, UA: NEGATIVE
Leukocytes, UA: NEGATIVE
Nitrite, UA: NEGATIVE
Protein, UA: NEGATIVE
RBC UA: NEGATIVE
UROBILINOGEN UA: NEGATIVE
pH, UA: 7

## 2016-07-07 MED ORDER — PHENAZOPYRIDINE HCL 200 MG PO TABS: 200.0000 mg | ORAL_TABLET | Freq: Three times a day (TID) | ORAL | 0 refills | Status: DC | PRN

## 2016-07-07 NOTE — Progress Notes (Signed)
66 y.o. Married Caucasian female [phone] here with complaint of urinary frequency/urgency with onset the past few days. No dysuria or urine odor. Patient denies fever, chills, nausea or back pain. No new personal products. Patient feels not related to sexual activity. Denies any vaginal symptoms.. Menopausal with vaginal dryness using coconut oil with good result.. Patient consuming adequate water intake. Patient also drinks coffee, no soda.. Patient was seen in Urgent care for same symptoms about a month ago and urine was negative. Resolved spontaneously. Was seen by another Urgent care prior to that and was treated for UTI. Has same symptoms each time. Denies any change in detergent or soap or underwear. No incontinence or pad use. No other health concerns feels fine.  ROS: Pertinent to HPI  O: Healthy female WDWN Affect: Normal, orientation x 3 Skin : warm and dry CVAT: negative bilateral Abdomen: slight for suprapubic tenderness, no rebound, abdomen soft.  Pelvic exam: External genital area: normal, no lesions Bladder,Urethra non tender, Urethral meatus:prominent, slightly increase pink, non tender, no prolapse Vagina: normal scant vaginal discharge, normal appearance. Vaginal dryness at introitus noted  Wet prep taken Cervix: normal, non tender Uterus:normal,non tender Adnexa: normal non tender, no fullness or masses noted   A: Normal pelvic exam R/O UTI R/O vaginal infection  P: Reviewed findings of normal exam and negative urine. Vaginal dryness noted, but no significant change. Discussed checking for vaginal pathogens which may be increasing discomfort. Will treat per affirm. Discussed using vaginal moisturizer daily to see if change, especially around urethra meatus. Discussed also Pyridium use short term 2 days to see if symptoms resolve and culture back.. Instructions given to call if increase in symptoms or warning signs of UTI, Patient agreeable. Rx Pyridium see order. May need  Urology referral if continues or PUS Reviewed warning signs and symptoms of UTI and need to advise if occurring. Encouraged to limit soda, tea, and coffee and be sure to increase water intake.   RV prn

## 2016-07-07 NOTE — Patient Instructions (Signed)

## 2016-07-08 ENCOUNTER — Telehealth: Payer: Self-pay

## 2016-07-08 ENCOUNTER — Encounter: Payer: Self-pay | Admitting: Certified Nurse Midwife

## 2016-07-08 LAB — WET PREP BY MOLECULAR PROBE
Candida species: NEGATIVE
Gardnerella vaginalis: NEGATIVE
Trichomonas vaginosis: NEGATIVE

## 2016-07-08 LAB — URINE CULTURE: Organism ID, Bacteria: NO GROWTH

## 2016-07-08 NOTE — Telephone Encounter (Signed)
-----   Message from Regina Eck, CNM sent at 07/08/2016 11:14 AM EST ----- Notify patient that vaginal wet prep is negative for pathogens. Culture pending Patient status?

## 2016-07-08 NOTE — Telephone Encounter (Signed)
lmtcb

## 2016-07-08 NOTE — Progress Notes (Signed)
Encounter reviewed Kelli Matte, MD   

## 2016-07-08 NOTE — Telephone Encounter (Signed)
Patient returning your call.

## 2016-07-08 NOTE — Telephone Encounter (Signed)
Patient notified of results. See lab 

## 2016-07-09 ENCOUNTER — Telehealth: Payer: Self-pay

## 2016-07-09 DIAGNOSIS — R3915 Urgency of urination: Secondary | ICD-10-CM

## 2016-07-09 NOTE — Telephone Encounter (Signed)
Spoke with patient. Advised of message as seen below from [place]. Patient verbalizes understanding. Would like to try using estrogen cream and proceed with PUS in office. Appointment for PUS scheduled for 07/13/2016 at 2:30 pm with 3 pm consult with Dr.Jertson. Patient is agreeable to date and time. Order placed for precert.   Routing to [employer] CNM for review of estrogen cream. Okay for Estrace place 1/2 gram to urethral meatus M,W,F for two weeks 0RF?

## 2016-07-09 NOTE — Telephone Encounter (Signed)
Agree with plan 

## 2016-07-09 NOTE — Telephone Encounter (Signed)
-----   Message from Regina Eck, CNM sent at 07/09/2016  2:46 PM EST ----- Notify patient that urine culture is negative. Would like to try patient on very small pea size amount of Estrogen applied to urethral meatus M,W,F for two weeks to see this stops some of the urgency. Would also like to schedule PUS to evaluate due to reoccurring problem. Will also place referral to Urology if needed after evaluation here. Stop pyridium use.  Patient status? Will place order if patient agreeable for estrogen and PUS

## 2016-07-12 MED ORDER — ESTRADIOL 0.1 MG/GM VA CREA: TOPICAL_CREAM | VAGINAL | 0 refills | Status: DC

## 2016-07-12 NOTE — Telephone Encounter (Signed)
Rx for Estrace cream ordered to pharmacy on file.

## 2016-07-13 ENCOUNTER — Ambulatory Visit (INDEPENDENT_AMBULATORY_CARE_PROVIDER_SITE_OTHER): Payer: BC Managed Care – PPO | Admitting: Obstetrics and Gynecology

## 2016-07-13 ENCOUNTER — Encounter: Payer: Self-pay | Admitting: Obstetrics and Gynecology

## 2016-07-13 ENCOUNTER — Ambulatory Visit (INDEPENDENT_AMBULATORY_CARE_PROVIDER_SITE_OTHER): Payer: BC Managed Care – PPO

## 2016-07-13 VITALS — BP 122/80 | HR 64 | Resp 16 | Wt 150.0 lb

## 2016-07-13 DIAGNOSIS — R3915 Urgency of urination: Secondary | ICD-10-CM | POA: Diagnosis not present

## 2016-07-13 DIAGNOSIS — R350 Frequency of micturition: Secondary | ICD-10-CM

## 2016-07-13 DIAGNOSIS — N952 Postmenopausal atrophic vaginitis: Secondary | ICD-10-CM | POA: Diagnosis not present

## 2016-07-13 NOTE — Progress Notes (Signed)
GYNECOLOGY  VISIT   HPI: 66 y.o.   Married  Caucasian  female   [phone] with Patient's last menstrual period was 06/15/2003.   here for F/U Pelvic pressure and pain.  She has recurrent issues where she feels she has a UTI, sometimes with negative cultures.  In the last 6 months this has happened 4 times. Most of the time she feels on the verge of a UTI. She feels frequency of urination, nocturia x 1, she is voiding normal amounts. Urgency to void, no leakage. Occasional discomfort with urination, not really burning. Sometimes feels she doesn't empty.  She just started vaginal estrogen. No pelvic pain currently. Last week she was having mild SP cramping. Her urine culture was negative last week.  She takes baths 3 x a week, with lavender oil or eucalyptus oil.  She drinks a 2 mugs of coffee a day. She voids 8-12 x a day.   GYNECOLOGIC HISTORY: Patient's last menstrual period was 06/15/2003. Contraception:postmenopause  Menopausal hormone therapy: none         OB History    Gravida Para Term Preterm AB Living   4 2     2 2    SAB TAB Ectopic Multiple Live Births   2       2         There are no active problems to display for this patient.   Past Medical History:  Diagnosis Date  . MVP (mitral valve prolapse)   . Wrist fracture, right 07/2013   slipped on ice    Past Surgical History:  Procedure Laterality Date  . [place] SURGERY  2007  . TUBAL LIGATION  1993    Current Outpatient Prescriptions  Medication Sig Dispense Refill  . Biotin 1 MG CAPS Take by mouth daily.    Marland Kitchen CALCIUM PO Take by mouth daily. +D    . cholecalciferol (VITAMIN D) 1000 units tablet Take 1,000 Units by mouth daily.    . Coenzyme Q10 (CO Q 10 PO) Take by mouth daily.    Marland Kitchen estradiol (ESTRACE VAGINAL) 0.1 MG/GM vaginal cream Place 1/2 gram to urethral meatus M, W, F for 2 weeks. 42.5 g 0  . loratadine (CLARITIN) 10 MG tablet Take 10 mg by mouth daily.    Marland Kitchen LORazepam (ATIVAN) 0.5 MG tablet as  needed. Take 1/2 prn    . Omega-3 Fatty Acids (FISH OIL PO) Take by mouth.     No current facility-administered medications for this visit.      ALLERGIES: Sulfa antibiotics  Family History  Problem Relation Age of Onset  . Hypertension Mother   . Mitral valve prolapse Mother   . Hypertension Father   . Alzheimer's disease Father   . Diabetes Maternal Grandmother   . Diabetes Paternal Grandfather     Social History   Social History  . Marital status: Married    Spouse name: N/A  . Number of children: N/A  . Years of education: N/A   Occupational History  . Not on file.   Social History Main Topics  . Smoking status: Never Smoker  . Smokeless tobacco: Never Used  . Alcohol use 3.0 oz/week    5 Standard drinks or equivalent per week  . Drug use: No  . Sexual activity: Yes    Partners: Male    Birth control/ protection: Surgical     Comment: BTL   Other Topics Concern  . Not on file   Social History Narrative  .  No narrative on file    Review of Systems  Constitutional: Negative.   HENT: Negative.   Eyes: Negative.   Respiratory: Negative.   Cardiovascular: Negative.   Gastrointestinal: Negative.   Genitourinary: Negative.   Musculoskeletal: Negative.   Skin: Negative.   Neurological: Negative.   Endo/Heme/Allergies: Negative.   Psychiatric/Behavioral: Negative.     PHYSICAL EXAMINATION:    LMP 06/15/2003     General appearance: alert, cooperative and appears stated age  Ultrasound images reviewed with the patient. Her bladder appeared full. I reviewed with the tech and her bladder was mostly empty at the beginning of the ultrasound, filled during the ultrasound  ASSESSMENT Urinary frequency, urinary urgency, negative urine culture Pelvic pain, intermittent, suprapubic (like with a bladder infection) H/o vaginal atrophy    PLAN Stop adding things to her bath Recommended she cut back on her caffeine.  Recommended she try the estrogen  vaginally, 1 gram vaginally 2 x a week at hs Voiding diary and a hat given We discussed the possibility of medication if the other things don't help. She would like to avoid   An After Visit Summary was printed and given to the patient.  15 minutes face to face time of which over 50% was spent in counseling.

## 2016-07-13 NOTE — Patient Instructions (Signed)
Use 1 gram of the estrace cream 2 x a week at bedtime.

## 2016-07-14 NOTE — Progress Notes (Signed)
No urology referral was ordered

## 2016-07-16 ENCOUNTER — Telehealth: Payer: Self-pay | Admitting: Certified Nurse Midwife

## 2016-07-16 NOTE — Telephone Encounter (Signed)
Spoke with patient. Patient states she is returning a call to [place] from today. Advised I will review with Joy and have her give her a call back with any further recommendations. Patient is agreeable.  No note seen regarding call today.

## 2016-07-16 NOTE — Telephone Encounter (Signed)
I haven't called this patient.

## 2016-07-16 NOTE — Telephone Encounter (Signed)
Spoke with patient. Advised patient I have reviewed with Joy telephone call, but no recent call has been made. Patient is agreeable stating she feels this was an old voicemail she was listening to. Will check date and return call if she has any further questions.  Routing to provider for final review. Patient agreeable to disposition. Will close encounter.

## 2016-07-16 NOTE — Telephone Encounter (Signed)
Patient says she is returning Joy's call.

## 2016-10-28 ENCOUNTER — Encounter: Payer: Self-pay | Admitting: Certified Nurse Midwife

## 2017-03-17 ENCOUNTER — Ambulatory Visit: Payer: BC Managed Care – PPO | Admitting: Certified Nurse Midwife

## 2017-04-05 ENCOUNTER — Ambulatory Visit (INDEPENDENT_AMBULATORY_CARE_PROVIDER_SITE_OTHER): Payer: Medicare Other | Admitting: Certified Nurse Midwife

## 2017-04-05 ENCOUNTER — Encounter: Payer: Self-pay | Admitting: Certified Nurse Midwife

## 2017-04-05 VITALS — BP 98/60 | HR 64 | Resp 16 | Ht 67.75 in | Wt 147.0 lb

## 2017-04-05 DIAGNOSIS — E559 Vitamin D deficiency, unspecified: Secondary | ICD-10-CM | POA: Diagnosis not present

## 2017-04-05 DIAGNOSIS — Z01419 Encounter for gynecological examination (general) (routine) without abnormal findings: Secondary | ICD-10-CM

## 2017-04-05 DIAGNOSIS — R899 Unspecified abnormal finding in specimens from other organs, systems and tissues: Secondary | ICD-10-CM

## 2017-04-05 DIAGNOSIS — Z0000 Encounter for general adult medical examination without abnormal findings: Secondary | ICD-10-CM

## 2017-04-05 NOTE — Patient Instructions (Signed)

## 2017-04-05 NOTE — Progress Notes (Signed)
66 y.o. [id number] Married  Caucasian Fe here for annual exam. Denies vaginal dryness or vaginal bleeding. Using coconut oil with good result. Stressful years, father passed and then mother-in-law passed one month later. Took trip to [place] and Spouse's knee issue, but managed to finish vacation. Spouse having knee surgery tomorrow. She is amazed she has handled all this, including memorial for father. Took flu vaccine recently at PCP office. She sees PCP prn. Yearly visit with cardiology for MVP no change in appearance per patient.. Screening labs requested today. No other health issues today.  Patient's last menstrual period was 06/15/2003.          Sexually active: Yes.    The current method of family planning is tubal ligation.    Exercising: Yes.    walking & stretching Smoker:  no  Health Maintenance: Pap:  03-03-16 neg HPV HR neg History of Abnormal Pap: no MMG:  10-20-16 category b density birads 1:neg Self Breast exams: yes Colonoscopy:  2013 neg f/u 24yrs BMD:   2016 osteopenia  TDaP:  2011 Shingles: 2013, 2018 Pneumonia: 2018 Hep C and HIV: Hep c neg 2017, HIV neg 12yrs ago Labs: if needed.   reports that she has never smoked. She has never used smokeless tobacco. She reports that she drinks about 3.0 oz of alcohol per week . She reports that she does not use drugs.  Past Medical History:  Diagnosis Date  . MVP (mitral valve prolapse)   . Wrist fracture, right 07/2013   slipped on ice    Past Surgical History:  Procedure Laterality Date  . [place] SURGERY  2007  . TUBAL LIGATION  1993    Current Outpatient Prescriptions  Medication Sig Dispense Refill  . Biotin 1 MG CAPS Take by mouth daily.    Marland Kitchen CALCIUM PO Take by mouth daily. +D    . cholecalciferol (VITAMIN D) 1000 units tablet Take 1,000 Units by mouth daily.    . Coenzyme Q10 (CO Q 10 PO) Take by mouth daily.    Marland Kitchen estradiol (ESTRACE VAGINAL) 0.1 MG/GM vaginal cream Place 1/2 gram to urethral meatus M, W, F  for 2 weeks. 42.5 g 0  . loratadine (CLARITIN) 10 MG tablet Take 10 mg by mouth daily.    Marland Kitchen LORazepam (ATIVAN) 0.5 MG tablet as needed. Take 1/2 prn    . Omega-3 Fatty Acids (FISH OIL PO) Take by mouth.     No current facility-administered medications for this visit.     Family History  Problem Relation Age of Onset  . Hypertension Mother   . Mitral valve prolapse Mother   . Hypertension Father   . Alzheimer's disease Father   . Diabetes Maternal Grandmother   . Diabetes Paternal Grandfather     ROS:  Pertinent items are noted in HPI.  Otherwise, a comprehensive ROS was negative.  Exam:   LMP 06/15/2003    Ht Readings from Last 3 Encounters:  07/07/16 5\' 8"  (1.727 m)  03/03/16 5\' 8"  (1.727 m)  03/28/15 5' 7.75" (1.721 m)    General appearance: alert, cooperative and appears stated age Head: Normocephalic, without obvious abnormality, atraumatic Neck: no adenopathy, supple, symmetrical, trachea midline and thyroid normal to inspection and palpation Lungs: clear to auscultation bilaterally Breasts: normal appearance, no masses or tenderness, Inspection negative, No nipple retraction or dimpling, No nipple discharge or bleeding Heart: regular rate and rhythm Abdomen: soft, non-tender; no masses,  no organomegaly Extremities: extremities normal, atraumatic, no cyanosis or edema  Skin: Skin color, texture, turgor normal. No rashes or lesions Lymph nodes: Cervical, supraclavicular, and axillary nodes normal. No abnormal inguinal nodes palpated Neurologic: Grossly normal   Pelvic: External genitalia:  no lesions              Urethra:  normal appearing urethra with no masses, tenderness or lesions              Bartholin's and Skene's: normal                 Vagina: normal appearing vagina with normal color and discharge, no lesions              Cervix: no cervical motion tenderness, no lesions and noted to becoming meshed into posterior vaginal wall              Pap taken:  No. Bimanual Exam:  Uterus:  normal size, contour, position, consistency, mobility, non-tender              Adnexa: normal adnexa and no mass, fullness, tenderness               Rectovaginal: Confirms               Anus:  normal sphincter tone, no lesions  Chaperone present: yes  A:  Well Woman with normal exam  Post menopausal no HRT  Vaginal dryness coconut oil working well  Cardiology management for MVP, no change per patient with exam this year  Social stress with father's death  Screening labs  P:   Reviewed health and wellness pertinent to exam  Aware to advise if vaginal bleeding.  Continue coconut oil as discussed.  Continue follow up as indicated per MD  Discussed seeking spouse and family support as needed. Patient feels she is doing well at this time  Labs:CBC with diff, CMP, Lipid panel, Vit. D  Pap smear: no   counseled on breast self exam, mammography screening, feminine hygiene, menopause, adequate intake of calcium and vitamin D, diet and exercise  return annually or prn  An After Visit Summary was printed and given to the patient.

## 2017-04-06 LAB — CBC WITH DIFFERENTIAL/PLATELET
BASOS: 1 %
Basophils Absolute: 0.1 10*3/uL (ref 0.0–0.2)
EOS (ABSOLUTE): 0.2 10*3/uL (ref 0.0–0.4)
EOS: 4 %
Hematocrit: 46.9 % — ABNORMAL HIGH (ref 34.0–46.6)
Hemoglobin: 15.8 g/dL (ref 11.1–15.9)
Immature Grans (Abs): 0 10*3/uL (ref 0.0–0.1)
Immature Granulocytes: 0 %
LYMPHS ABS: 2.3 10*3/uL (ref 0.7–3.1)
Lymphs: 39 %
MCH: 30.3 pg (ref 26.6–33.0)
MCHC: 33.7 g/dL (ref 31.5–35.7)
MCV: 90 fL (ref 79–97)
MONOCYTES: 10 %
MONOS ABS: 0.6 10*3/uL (ref 0.1–0.9)
NEUTROS ABS: 2.8 10*3/uL (ref 1.4–7.0)
Neutrophils: 46 %
Platelets: 211 10*3/uL (ref 150–379)
RBC: 5.22 x10E6/uL (ref 3.77–5.28)
RDW: 13.3 % (ref 12.3–15.4)
WBC: 5.9 10*3/uL (ref 3.4–10.8)

## 2017-04-06 LAB — COMPREHENSIVE METABOLIC PANEL
ALBUMIN: 4.6 g/dL (ref 3.6–4.8)
ALT: 15 IU/L (ref 0–32)
AST: 25 IU/L (ref 0–40)
Albumin/Globulin Ratio: 2.2 (ref 1.2–2.2)
Alkaline Phosphatase: 64 IU/L (ref 39–117)
BILIRUBIN TOTAL: 1.1 mg/dL (ref 0.0–1.2)
BUN / CREAT RATIO: 15 (ref 12–28)
BUN: 13 mg/dL (ref 8–27)
CALCIUM: 9.7 mg/dL (ref 8.7–10.3)
CHLORIDE: 102 mmol/L (ref 96–106)
CO2: 25 mmol/L (ref 20–29)
CREATININE: 0.89 mg/dL (ref 0.57–1.00)
GFR, EST AFRICAN AMERICAN: 79 mL/min/{1.73_m2} (ref >59)
GFR, EST NON AFRICAN AMERICAN: 68 mL/min/{1.73_m2} (ref >59)
GLUCOSE: 94 mg/dL (ref 65–99)
Globulin, Total: 2.1 g/dL (ref 1.5–4.5)
Potassium: 4.7 mmol/L (ref 3.5–5.2)
Sodium: 142 mmol/L (ref 134–144)
TOTAL PROTEIN: 6.7 g/dL (ref 6.0–8.5)

## 2017-04-06 LAB — LIPID PANEL
CHOL/HDL RATIO: 2.6 ratio (ref 0.0–4.4)
Cholesterol, Total: 190 mg/dL (ref 100–199)
HDL: 73 mg/dL (ref >39)
LDL Calculated: 106 mg/dL — ABNORMAL HIGH (ref 0–99)
Triglycerides: 57 mg/dL (ref 0–149)
VLDL Cholesterol Cal: 11 mg/dL (ref 5–40)

## 2017-04-06 LAB — VITAMIN D 25 HYDROXY (VIT D DEFICIENCY, FRACTURES): VIT D 25 HYDROXY: 53.6 ng/mL (ref 30.0–100.0)

## 2017-04-21 ENCOUNTER — Telehealth: Payer: Self-pay

## 2017-04-21 NOTE — Telephone Encounter (Signed)
Pt notified of bmd results. See scanned in results. 

## 2017-12-01 DIAGNOSIS — Z8249 Family history of ischemic heart disease and other diseases of the circulatory system: Secondary | ICD-10-CM

## 2017-12-01 DIAGNOSIS — Q676 Pectus excavatum: Secondary | ICD-10-CM

## 2017-12-01 DIAGNOSIS — M419 Scoliosis, unspecified: Secondary | ICD-10-CM

## 2017-12-01 DIAGNOSIS — F329 Major depressive disorder, single episode, unspecified: Secondary | ICD-10-CM | POA: Insufficient documentation

## 2017-12-01 DIAGNOSIS — I341 Nonrheumatic mitral (valve) prolapse: Secondary | ICD-10-CM | POA: Insufficient documentation

## 2017-12-01 DIAGNOSIS — M8580 Other specified disorders of bone density and structure, unspecified site: Secondary | ICD-10-CM | POA: Insufficient documentation

## 2017-12-01 DIAGNOSIS — E785 Hyperlipidemia, unspecified: Secondary | ICD-10-CM | POA: Insufficient documentation

## 2017-12-01 DIAGNOSIS — F32A Depression, unspecified: Secondary | ICD-10-CM | POA: Insufficient documentation

## 2017-12-01 HISTORY — DX: Family history of ischemic heart disease and other diseases of the circulatory system: Z82.49

## 2017-12-01 HISTORY — DX: Depression, unspecified: F32.A

## 2017-12-01 HISTORY — DX: Hyperlipidemia, unspecified: E78.5

## 2017-12-01 HISTORY — DX: Nonrheumatic mitral (valve) prolapse: I34.1

## 2017-12-01 HISTORY — DX: Scoliosis, unspecified: M41.9

## 2017-12-01 HISTORY — DX: Pectus excavatum: Q67.6

## 2017-12-05 ENCOUNTER — Encounter: Payer: Self-pay | Admitting: Certified Nurse Midwife

## 2017-12-20 NOTE — Progress Notes (Signed)
Cardiology Office Note:    Date:  12/21/2017   ID:  Kelli Saunders, DOB 1950/12/26, MRN [id number]  PCP:  Marco Collie, MD  Cardiologist:  Shirlee More, MD    Referring MD: Marco Collie, MD    ASSESSMENT:    1. Mitral valve prolapse   2. Family history of long QT syndrome   3. Hyperlipidemia, unspecified hyperlipidemia type   4. Palpitation    PLAN:    In order of problems listed above:  1. Stable no murmur little or no palpitation and on think she requires repeat cardiac imaging at this time 2. Stable EKG remains normal no clinical indication she has severe arrhythmia 3. Stable I reviewed her lipids with her and I think the target and she is using over-the-counter fish oil 4. Very little palpitation she took a single dose of beta-blocker on one occasion with good symptomatic relief.   Next appointment: One year   Medication Adjustments/Labs and Tests Ordered: Current medicines are reviewed at length with the patient today.  Concerns regarding medicines are outlined above.  Orders Placed This Encounter  Procedures  . Comp Met (CMET)  . Lipid Profile  . EKG 12-Lead   No orders of the defined types were placed in this encounter.   Chief Complaint  Patient presents with  . Palpitations  . Mitral Valve Prolapse    History of Present Illness:    Kelli Saunders is a 67 y.o. female with a hx of mitral valve prolapse palpitation and family history of long QT syndrome last seen 10/18/16 Compliance with diet, lifestyle and medications: Yes  She is pleased with the quality of her life very little stress exercises regularly and feels improved.  She had one episode of palpitation took a half dose of beta-blocker with good relief and has had no other symptoms.  No chest pain shortness of breath or syncope Past Medical History:  Diagnosis Date  . Depression 12/01/2017  . Family history of long QT syndrome 12/01/2017  . Hyperlipidemia 12/01/2017  . Left atrial  enlargement 12/27/2014  . Mitral valve prolapse 12/01/2017  . MVP (mitral valve prolapse)   . Osteopenia   . Palpitation 12/26/2014  . Pectus excavatum 12/01/2017  . Thoracic scoliosis 12/01/2017  . Wrist fracture, right 07/2013   slipped on ice    Past Surgical History:  Procedure Laterality Date  . [place] SURGERY  2007  . L5 discectomy    . TUBAL LIGATION  1993    Current Medications: Current Meds  Medication Sig  . Biotin 1 MG CAPS Take by mouth daily.  Marland Kitchen CALCIUM PO Take by mouth daily. +D  . cholecalciferol (VITAMIN D) 1000 units tablet Take 1,000 Units by mouth daily.  . Coenzyme Q10 (CO Q 10 PO) Take by mouth daily.  . Cranberry 500 MG CAPS Take 1 capsule by mouth daily.  . Omega-3 Fatty Acids (FISH OIL PO) Take by mouth.  Marland Kitchen UNABLE TO FIND Hydro eye     Allergies:   Sulfa antibiotics   Social History   Socioeconomic History  . Marital status: Married    Spouse name: Not on file  . Number of children: Not on file  . Years of education: Not on file  . Highest education level: Not on file  Occupational History  . Not on file  Social Needs  . Financial resource strain: Not on file  . Food insecurity:    Worry: Not on file    Inability: Not  on file  . Transportation needs:    Medical: Not on file    Non-medical: Not on file  Tobacco Use  . Smoking status: Never Smoker  . Smokeless tobacco: Never Used  Substance and Sexual Activity  . Alcohol use: Yes    Alcohol/week: 3.0 - 3.6 oz    Types: 5 - 6 Standard drinks or equivalent per week  . Drug use: No  . Sexual activity: Yes    Partners: Male    Birth control/protection: Surgical    Comment: BTL  Lifestyle  . Physical activity:    Days per week: Not on file    Minutes per session: Not on file  . Stress: Not on file  Relationships  . Social connections:    Talks on phone: Not on file    Gets together: Not on file    Attends religious service: Not on file    Active member of club or organization:  Not on file    Attends meetings of clubs or organizations: Not on file    Relationship status: Not on file  Other Topics Concern  . Not on file  Social History Narrative  . Not on file     Family History: The patient's family history includes Alzheimer's disease in her father; Breast cancer in her maternal aunt; Diabetes in her maternal grandmother and paternal grandfather; Hypertension in her father and mother; Mitral valve prolapse in her mother. ROS:   Please see the history of present illness.    All other systems reviewed and are negative.  EKGs/Labs/Other Studies Reviewed:    The following studies were reviewed today:  EKG:  EKG ordered today.  The ekg ordered today demonstrates sinus rhythm normal including QT interval  Recent Labs: 04/05/2017: ALT 15; BUN 13; Creatinine, Ser 0.89; Hemoglobin 15.8; Platelets 211; Potassium 4.7; Sodium 142  Recent Lipid Panel    Component Value Date/Time   CHOL 190 04/05/2017 1030   TRIG 57 04/05/2017 1030   HDL 73 04/05/2017 1030   CHOLHDL 2.6 04/05/2017 1030   CHOLHDL 2.4 03/03/2016 1025   VLDL 11 03/03/2016 1025   LDLCALC 106 (H) 04/05/2017 1030    Physical Exam:    VS:  BP 98/64 (BP Location: Right Arm, Patient Position: Sitting, Cuff Size: Normal)   Pulse 64   Ht 5' 7.75" (1.721 m)   Wt 142 lb (64.4 kg)   LMP 06/15/2003   SpO2 98%   BMI 21.75 kg/m     Wt Readings from Last 3 Encounters:  12/21/17 142 lb (64.4 kg)  04/05/17 147 lb (66.7 kg)  07/13/16 150 lb (68 kg)     GEN:  Well nourished, well developed in no acute distress HEENT: Normal NECK: No JVD; No carotid bruits LYMPHATICS: No lymphadenopathy CARDIAC: RRR, no murmurs, rubs, gallops RESPIRATORY:  Clear to auscultation without rales, wheezing or rhonchi  ABDOMEN: Soft, non-tender, non-distended MUSCULOSKELETAL:  No edema; No deformity  SKIN: Warm and dry NEUROLOGIC:  Alert and oriented x 3 PSYCHIATRIC:  Normal affect    Signed, Shirlee More, MD    12/21/2017 10:24 AM    [place]

## 2017-12-21 ENCOUNTER — Ambulatory Visit: Payer: Medicare Other | Admitting: Cardiology

## 2017-12-21 ENCOUNTER — Encounter: Payer: Self-pay | Admitting: Cardiology

## 2017-12-21 VITALS — BP 98/64 | HR 64 | Ht 67.75 in | Wt 142.0 lb

## 2017-12-21 DIAGNOSIS — I341 Nonrheumatic mitral (valve) prolapse: Secondary | ICD-10-CM

## 2017-12-21 DIAGNOSIS — R002 Palpitations: Secondary | ICD-10-CM | POA: Diagnosis not present

## 2017-12-21 DIAGNOSIS — E785 Hyperlipidemia, unspecified: Secondary | ICD-10-CM | POA: Diagnosis not present

## 2017-12-21 DIAGNOSIS — Z8249 Family history of ischemic heart disease and other diseases of the circulatory system: Secondary | ICD-10-CM

## 2017-12-21 LAB — COMPREHENSIVE METABOLIC PANEL
ALBUMIN: 4.8 g/dL (ref 3.6–4.8)
ALT: 17 IU/L (ref 0–32)
AST: 27 IU/L (ref 0–40)
Albumin/Globulin Ratio: 2.4 — ABNORMAL HIGH (ref 1.2–2.2)
Alkaline Phosphatase: 68 IU/L (ref 39–117)
BUN / CREAT RATIO: 16 (ref 12–28)
BUN: 13 mg/dL (ref 8–27)
Bilirubin Total: 1.2 mg/dL (ref 0.0–1.2)
CALCIUM: 9.9 mg/dL (ref 8.7–10.3)
CO2: 25 mmol/L (ref 20–29)
Chloride: 97 mmol/L (ref 96–106)
Creatinine, Ser: 0.82 mg/dL (ref 0.57–1.00)
GFR, EST AFRICAN AMERICAN: 86 mL/min/{1.73_m2} (ref >59)
GFR, EST NON AFRICAN AMERICAN: 75 mL/min/{1.73_m2} (ref >59)
GLOBULIN, TOTAL: 2 g/dL (ref 1.5–4.5)
Glucose: 89 mg/dL (ref 65–99)
Potassium: 4 mmol/L (ref 3.5–5.2)
Sodium: 138 mmol/L (ref 134–144)
Total Protein: 6.8 g/dL (ref 6.0–8.5)

## 2017-12-21 LAB — LIPID PANEL
CHOL/HDL RATIO: 2.6 ratio (ref 0.0–4.4)
Cholesterol, Total: 207 mg/dL — ABNORMAL HIGH (ref 100–199)
HDL: 80 mg/dL (ref >39)
LDL CALC: 114 mg/dL — AB (ref 0–99)
Triglycerides: 63 mg/dL (ref 0–149)
VLDL Cholesterol Cal: 13 mg/dL (ref 5–40)

## 2017-12-21 NOTE — Patient Instructions (Signed)
Medication Instructions:  Your physician recommends that you continue on your current medications as directed. Please refer to the Current Medication list given to you today.   Labwork: Your physician recommends that you have the following labs drawn:CMP and Lipids   Testing/Procedures: You had an EKG today.  Follow-Up: Your physician wants you to follow-up in: 1 year. You will receive a reminder letter in the mail two months in advance. If you don't receive a letter, please call our office to schedule the follow-up appointment.   Any Other Special Instructions Will Be Listed Below (If Applicable).     If you need a refill on your cardiac medications before your next appointment, please call your pharmacy.

## 2018-04-13 ENCOUNTER — Ambulatory Visit: Payer: Medicare Other | Admitting: Certified Nurse Midwife

## 2018-04-13 ENCOUNTER — Encounter: Payer: Self-pay | Admitting: Certified Nurse Midwife

## 2018-04-13 ENCOUNTER — Other Ambulatory Visit: Payer: Self-pay

## 2018-04-13 VITALS — BP 110/70 | HR 68 | Resp 16 | Ht 67.75 in | Wt 147.0 lb

## 2018-04-13 DIAGNOSIS — N951 Menopausal and female climacteric states: Secondary | ICD-10-CM | POA: Diagnosis not present

## 2018-04-13 DIAGNOSIS — Z01419 Encounter for gynecological examination (general) (routine) without abnormal findings: Secondary | ICD-10-CM | POA: Diagnosis not present

## 2018-04-13 NOTE — Patient Instructions (Signed)

## 2018-04-13 NOTE — Progress Notes (Signed)
67 y.o. [id number] Married  Caucasian Fe here for annual exam. Post menopausal no HRT. Denies vaginal bleeding. Vaginal dryness managed with coconut oil with no issues. No height change this year. Still seeing cardiology for MVP, no changes. Sees Marco Collie for aex/labs, slight cholesterol elevation only. "Better year this year "! No health concerns today.   Patient's last menstrual period was 06/15/2003.          Sexually active: No.  The current method of family planning is tubal ligation.    Exercising: Yes.    yoga, aqua dance, walking Smoker:  no  Review of Systems  Constitutional: Negative.   HENT: Negative.   Eyes: Negative.   Respiratory: Negative.   Cardiovascular: Negative.   Gastrointestinal: Negative.   Genitourinary: Negative.   Musculoskeletal: Negative.   Skin: Negative.   Neurological: Negative.   Endo/Heme/Allergies: Negative.   Psychiatric/Behavioral: Negative.     Health Maintenance: Pap:  03-03-16 neg HPV HR neg History of Abnormal Pap: no MMG:  10-21-17 category b density birads 1:neg Self Breast exams: yes Colonoscopy:  2013 neg f/u 48yrs BMD:   2018 osteopenia TDaP:  2011 Shingles: 2013, 2018 Pneumonia: 2018, 2019 Hep C and HIV: Hep c neg 2017, HIV neg yrs ago Labs: PCP   reports that she has never smoked. She has never used smokeless tobacco. She reports that she drinks about 5.0 - 6.0 standard drinks of alcohol per week. She reports that she does not use drugs.  Past Medical History:  Diagnosis Date  . Depression 12/01/2017  . Family history of long QT syndrome 12/01/2017  . Hyperlipidemia 12/01/2017  . Left atrial enlargement 12/27/2014  . Mitral valve prolapse 12/01/2017  . MVP (mitral valve prolapse)   . Osteopenia   . Palpitation 12/26/2014  . Pectus excavatum 12/01/2017  . Thoracic scoliosis 12/01/2017  . Wrist fracture, right 07/2013   slipped on ice    Past Surgical History:  Procedure Laterality Date  . [place] SURGERY  2007  . L5  discectomy    . TUBAL LIGATION  1993    Current Outpatient Medications  Medication Sig Dispense Refill  . Biotin 1 MG CAPS Take by mouth daily.    Marland Kitchen CALCIUM PO Take by mouth daily. +D    . cholecalciferol (VITAMIN D) 1000 units tablet Take 1,000 Units by mouth daily.    . Coenzyme Q10 (CO Q 10 PO) Take by mouth daily.    . Cranberry 500 MG CAPS Take 1 capsule by mouth daily.    . Omega-3 Fatty Acids (FISH OIL PO) Take by mouth.    Marland Kitchen UNABLE TO FIND Hydro eye     No current facility-administered medications for this visit.     Family History  Problem Relation Age of Onset  . Hypertension Mother   . Mitral valve prolapse Mother   . Hypertension Father   . Alzheimer's disease Father   . Diabetes Maternal Grandmother   . Diabetes Paternal Grandfather   . Breast cancer Maternal Aunt     ROS:  Pertinent items are noted in HPI.  Otherwise, a comprehensive ROS was negative.  Exam:   LMP 06/15/2003    Ht Readings from Last 3 Encounters:  12/21/17 5' 7.75" (1.721 m)  04/05/17 5' 7.75" (1.721 m)  07/07/16 5\' 8"  (1.727 m)    General appearance: alert, cooperative and appears stated age Head: Normocephalic, without obvious abnormality, atraumatic Neck: no adenopathy, supple, symmetrical, trachea midline and thyroid normal to inspection and  palpation Lungs: clear to auscultation bilaterally Breasts: normal appearance, no masses or tenderness, No nipple retraction or dimpling, No nipple discharge or bleeding, No axillary or supraclavicular adenopathy Heart: regular rate and rhythm Abdomen: soft, non-tender; no masses,  no organomegaly Extremities: extremities normal, atraumatic, no cyanosis or edema Skin: Skin color, texture, turgor normal. No rashes or lesions Lymph nodes: Cervical, supraclavicular, and axillary nodes normal. No abnormal inguinal nodes palpated Neurologic: Grossly normal   Pelvic: External genitalia:  no lesions              Urethra:  normal appearing urethra  with no masses, tenderness or lesions              Bartholin's and Skene's: normal                 Vagina: normal appearing vagina with normal color and discharge, no lesions, moisture present              Cervix: no cervical motion tenderness, no lesions and normal appearance              Pap taken: No. Bimanual Exam:  Uterus:  normal size, contour, position, consistency, mobility, non-tender and anteverted              Adnexa: normal adnexa and no mass, fullness, tenderness               Rectovaginal: Confirms               Anus:  normal sphincter tone, no lesions  Chaperone present: yes  A:  Well Woman with normal exam  Post menopausal no HRT  Vaginal dryness responding well to coconut oil no issues  Osteopenia continues with calcium and Vitamin D  Cardiology follow up for MVP, no change per patient   P:   Reviewed health and wellness pertinent to exam  Aware of need to advise if vaginal bleeding  Continue with supplements and weight exercise  Continue follow up with MD's as indicated  Pap smear: no   counseled on breast self exam, mammography screening, adequate intake of calcium and vitamin D, diet and exercise  return annually or prn  An After Visit Summary was printed and given to the patient.

## 2018-10-15 ENCOUNTER — Other Ambulatory Visit: Payer: Self-pay | Admitting: Cardiology

## 2018-10-17 ENCOUNTER — Telehealth: Payer: Self-pay | Admitting: *Deleted

## 2018-10-17 NOTE — Telephone Encounter (Signed)
Yes metoprolol 25 mg daily PRN palpitation  # 30 refill 3 I suspect her meds were not reconciled at her last visit. She is correct.

## 2018-10-17 NOTE — Telephone Encounter (Signed)
Spoke with pt to verify refill of the Metoprolol 25 mg. It was not anywhere on her medication list but there was a comment in Dr. Joya Gaskins office note about med.: Very little palpitation she took a single dose of beta-blocker on one occasion with good symptomatic relief. Pt would like about 10 called in. Got 30 in 2018 and only used 1 1/2 of them and now they are expired. Just wanted to double check with Dr. Bettina Gavia about this refill before I do it. Pt made a f/u visit for July 9th. Please advise.

## 2018-12-02 ENCOUNTER — Encounter: Payer: Self-pay | Admitting: Certified Nurse Midwife

## 2018-12-20 NOTE — Progress Notes (Signed)
Cardiology Office Note:    Date:  12/21/2018   ID:  Kelli Saunders, DOB [birth year], MRN [id number]  PCP:  Kelli Collie, MD  Cardiologist:  Kelli More, MD    Referring MD: Kelli Collie, MD    ASSESSMENT:    1. Palpitation   2. Mitral valve prolapse   3. Hyperlipidemia, unspecified hyperlipidemia type    PLAN:    In order of problems listed above:  1. For purposes has had no recurrence she is taken 1 dose of metoprolol will continue intermittent beta-blocker recheck potassium.  Avoid over-the-counter proarrhythmic drugs 2. Stable no murmur on exam no reason to repeat echocardiogram 3. Recheck lipid profile her request   Next appointment: 1 year   Medication Adjustments/Labs and Tests Ordered: Current medicines are reviewed at length with the patient today.  Concerns regarding medicines are outlined above.  No orders of the defined types were placed in this encounter.  No orders of the defined types were placed in this encounter.   No chief complaint on file.   History of Present Illness:    Kelli Saunders is a 68 y.o. female with a hx of  mitral valve prolapse palpitation and family history of long QT syndrome  last seen 12/22/2018. Compliance with diet, lifestyle and medications: Yes  She has done very well especially COVID-19 stress no palpitation chest pain shortness of breath or edema.  She is taken 1 dose of beta-blocker Past Medical History:  Diagnosis Date  . Depression 12/01/2017  . Family history of long QT syndrome 12/01/2017  . Hyperlipidemia 12/01/2017  . Left atrial enlargement 12/27/2014  . Mitral valve prolapse 12/01/2017  . MVP (mitral valve prolapse)   . Osteopenia   . Palpitation 12/26/2014  . Pectus excavatum 12/01/2017  . Thoracic scoliosis 12/01/2017  . Wrist fracture, right 07/2013   slipped on ice    Past Surgical History:  Procedure Laterality Date  . [place] SURGERY  2007  . L5 discectomy    . TUBAL LIGATION  1993     Current Medications: Current Meds  Medication Sig  . Biotin 1 MG CAPS Take by mouth daily.  Marland Kitchen CALCIUM PO Take by mouth daily. +D  . cholecalciferol (VITAMIN D) 1000 units tablet Take 1,000 Units by mouth daily.  . Coenzyme Q10 (CO Q 10 PO) Take by mouth daily.  . Cranberry 500 MG CAPS Take 1 capsule by mouth daily.  . metoprolol tartrate (LOPRESSOR) 25 MG tablet TAKE 1 TABLET BY MOUTH EVERY DAY AS NEEDED  . naproxen sodium (ALEVE) 220 MG tablet Take by mouth as needed.  . Omega-3 Fatty Acids (FISH OIL PO) Take 1 tablet by mouth daily.   . Probiotic Product (FORTIFY PROBIOTIC WOMENS EX ST PO)   . UNABLE TO FIND Hydro eye     Allergies:   Sulfa antibiotics   Social History   Socioeconomic History  . Marital status: Married    Spouse name: Not on file  . Number of children: Not on file  . Years of education: Not on file  . Highest education level: Not on file  Occupational History  . Not on file  Social Needs  . Financial resource strain: Not on file  . Food insecurity    Worry: Not on file    Inability: Not on file  . Transportation needs    Medical: Not on file    Non-medical: Not on file  Tobacco Use  . Smoking status: Never Smoker  .  Smokeless tobacco: Never Used  Substance and Sexual Activity  . Alcohol use: Yes    Alcohol/week: 1.0 standard drinks    Types: 1 Glasses of wine per week    Comment: 1 glass of wine 4 times per week   . Drug use: No  . Sexual activity: Yes    Partners: Male    Birth control/protection: Surgical    Comment: BTL  Lifestyle  . Physical activity    Days per week: Not on file    Minutes per session: Not on file  . Stress: Not on file  Relationships  . Social Herbalist on phone: Not on file    Gets together: Not on file    Attends religious service: Not on file    Active member of club or organization: Not on file    Attends meetings of clubs or organizations: Not on file    Relationship status: Not on file  Other  Topics Concern  . Not on file  Social History Narrative  . Not on file     Family History: The patient's family history includes Alzheimer's disease in her father; Breast cancer in her maternal aunt; Diabetes in her maternal grandmother and paternal grandfather; Hypertension in her father and mother; Mitral valve prolapse in her mother. ROS:   Please see the history of present illness.    All other systems reviewed and are negative.  EKGs/Labs/Other Studies Reviewed:    The following studies were reviewed today:  EKG:  EKG ordered today and personally reviewed.  The ekg ordered today demonstrates normal including QT interval  Recent Labs: No results found for requested labs within last 8760 hours.  Recent Lipid Panel    Component Value Date/Time   CHOL 207 (H) 12/21/2017 0905   TRIG 63 12/21/2017 0905   HDL 80 12/21/2017 0905   CHOLHDL 2.6 12/21/2017 0905   CHOLHDL 2.4 03/03/2016 1025   VLDL 11 03/03/2016 1025   LDLCALC 114 (H) 12/21/2017 0905    Physical Exam:    VS:  BP (!) 92/58 (BP Location: Left Arm, Patient Position: Sitting, Cuff Size: Normal)   Pulse (!) 59   Temp (!) 97.1 F (36.2 C)   Ht 5' 8.5" (1.74 m)   Wt 141 lb (64 kg)   LMP 06/15/2003   SpO2 95%   BMI 21.13 kg/m     Wt Readings from Last 3 Encounters:  12/21/18 141 lb (64 kg)  04/13/18 147 lb (66.7 kg)  12/21/17 142 lb (64.4 kg)     GEN:  Well nourished, well developed in no acute distress HEENT: Normal NECK: No JVD; No carotid bruits LYMPHATICS: No lymphadenopathy CARDIAC: Pectus excavatum systolic clicks no murmur RRR, no murmurs, rubs, gallops RESPIRATORY:  Clear to auscultation without rales, wheezing or rhonchi  ABDOMEN: Soft, non-tender, non-distended MUSCULOSKELETAL:  No edema; No deformity  SKIN: Warm and dry NEUROLOGIC:  Alert and oriented x 3 PSYCHIATRIC:  Normal affect    Signed, Kelli More, MD  12/21/2018 9:40 AM    [place]

## 2018-12-21 ENCOUNTER — Other Ambulatory Visit: Payer: Self-pay

## 2018-12-21 ENCOUNTER — Encounter: Payer: Self-pay | Admitting: Cardiology

## 2018-12-21 ENCOUNTER — Ambulatory Visit (INDEPENDENT_AMBULATORY_CARE_PROVIDER_SITE_OTHER): Payer: Medicare Other | Admitting: Cardiology

## 2018-12-21 VITALS — BP 92/58 | HR 59 | Temp 97.1°F | Ht 68.5 in | Wt 141.0 lb

## 2018-12-21 DIAGNOSIS — E785 Hyperlipidemia, unspecified: Secondary | ICD-10-CM | POA: Diagnosis not present

## 2018-12-21 DIAGNOSIS — I341 Nonrheumatic mitral (valve) prolapse: Secondary | ICD-10-CM | POA: Diagnosis not present

## 2018-12-21 DIAGNOSIS — R002 Palpitations: Secondary | ICD-10-CM

## 2018-12-21 LAB — COMPREHENSIVE METABOLIC PANEL
ALT: 21 IU/L (ref 0–32)
AST: 29 IU/L (ref 0–40)
Albumin/Globulin Ratio: 2.7 — ABNORMAL HIGH (ref 1.2–2.2)
Albumin: 4.9 g/dL — ABNORMAL HIGH (ref 3.8–4.8)
Alkaline Phosphatase: 66 IU/L (ref 39–117)
BUN/Creatinine Ratio: 18 (ref 12–28)
BUN: 14 mg/dL (ref 8–27)
Bilirubin Total: 1.1 mg/dL (ref 0.0–1.2)
CO2: 24 mmol/L (ref 20–29)
Calcium: 9.7 mg/dL (ref 8.7–10.3)
Chloride: 98 mmol/L (ref 96–106)
Creatinine, Ser: 0.77 mg/dL (ref 0.57–1.00)
GFR calc Af Amer: 92 mL/min/{1.73_m2} (ref >59)
GFR calc non Af Amer: 80 mL/min/{1.73_m2} (ref >59)
Globulin, Total: 1.8 g/dL (ref 1.5–4.5)
Glucose: 97 mg/dL (ref 65–99)
Potassium: 4.4 mmol/L (ref 3.5–5.2)
Sodium: 137 mmol/L (ref 134–144)
Total Protein: 6.7 g/dL (ref 6.0–8.5)

## 2018-12-21 LAB — LIPID PANEL
Chol/HDL Ratio: 2.5 ratio (ref 0.0–4.4)
Cholesterol, Total: 177 mg/dL (ref 100–199)
HDL: 70 mg/dL (ref >39)
LDL Calculated: 95 mg/dL (ref 0–99)
Triglycerides: 61 mg/dL (ref 0–149)
VLDL Cholesterol Cal: 12 mg/dL (ref 5–40)

## 2018-12-21 MED ORDER — METOPROLOL TARTRATE 25 MG PO TABS: 25.0000 mg | ORAL_TABLET | Freq: Every day | ORAL | 1 refills | Status: DC | PRN

## 2018-12-21 NOTE — Addendum Note (Signed)
Addended by: Jerl Santos R on: 12/21/2018 10:10 AM   Modules accepted: Orders

## 2018-12-21 NOTE — Patient Instructions (Signed)
Medication Instructions:  Your physician recommends that you continue on your current medications as directed. Please refer to the Current Medication list given to you today.  If you need a refill on your cardiac medications before your next appointment, please call your pharmacy.   Lab work: Your physician recommends that you return for lab work today: CMP, lipid panel.   If you have labs (blood work) drawn today and your tests are completely normal, you will receive your results only by: Marland Kitchen MyChart Message (if you have MyChart) OR . A paper copy in the mail If you have any lab test that is abnormal or we need to change your treatment, we will call you to review the results.  Testing/Procedures: None  Follow-Up: At [facility], you and your health needs are our priority.  As part of our continuing mission to provide you with exceptional heart care, we have created designated Provider Care Teams.  These Care Teams include your primary Cardiologist (physician) and Advanced Practice Providers (APPs -  Physician Assistants and Nurse Practitioners) who all work together to provide you with the care you need, when you need it. You will need a follow up appointment in 1 years.  Please call our office 2 months in advance to schedule this appointment.

## 2018-12-21 NOTE — Addendum Note (Signed)
Addended by: Austin Miles on: 12/21/2018 09:51 AM   Modules accepted: Orders

## 2019-04-19 ENCOUNTER — Ambulatory Visit: Payer: Medicare Other | Admitting: Certified Nurse Midwife

## 2019-04-30 ENCOUNTER — Ambulatory Visit: Payer: Medicare Other | Admitting: Certified Nurse Midwife

## 2019-05-03 ENCOUNTER — Other Ambulatory Visit: Payer: Self-pay

## 2019-05-04 ENCOUNTER — Ambulatory Visit: Payer: Medicare Other | Admitting: Sports Medicine

## 2019-05-04 ENCOUNTER — Encounter: Payer: Self-pay | Admitting: Sports Medicine

## 2019-05-04 DIAGNOSIS — M79674 Pain in right toe(s): Secondary | ICD-10-CM

## 2019-05-04 DIAGNOSIS — B351 Tinea unguium: Secondary | ICD-10-CM | POA: Diagnosis not present

## 2019-05-04 DIAGNOSIS — M79675 Pain in left toe(s): Secondary | ICD-10-CM | POA: Diagnosis not present

## 2019-05-04 NOTE — Progress Notes (Signed)
Subjective: Kelli Saunders is a 68 y.o. female patient seen today in office with complaint of mildly painful thickened and discolored nails. Patient is desiring treatment for nail changes; has tried OTC topicals/Medication in the past with no improvement. Reports that nails are becoming difficult to manage because of the thickness especially at the right great toe reports that her dermatologist sent a culture about a year and 1/2 to 2 years ago and said that there was nothing growing in the toenail however she has been consistent with applying alcohol and Vicks vapor rub for the last few years and there used to be a dark streak in the right great toenail that grew out but she is concerned because the nails are still continue to change and wants to discuss that there is something that can be done about it. Patient has no other pedal complaints at this time.   Patient Active Problem List   Diagnosis Date Noted  . Mitral valve prolapse 12/01/2017  . Depression 12/01/2017  . Hyperlipidemia 12/01/2017  . Family history of long QT syndrome 12/01/2017  . Osteopenia 12/01/2017  . Pectus excavatum 12/01/2017  . Thoracic scoliosis 12/01/2017  . Left atrial enlargement 12/27/2014  . Palpitation 12/26/2014    Current Outpatient Medications on File Prior to Visit  Medication Sig Dispense Refill  . Vitamin D, Ergocalciferol, (DRISDOL) 1.25 MG (50000 UT) CAPS capsule Take 50,000 Units by mouth every 7 (seven) days.    . Biotin 1 MG CAPS Take by mouth daily.    . Coenzyme Q10 (CO Q 10 PO) Take by mouth daily.    . Cranberry 500 MG CAPS Take 1 capsule by mouth daily.    . naproxen sodium (ALEVE) 220 MG tablet Take by mouth as needed.    . Omega-3 Fatty Acids (FISH OIL PO) Take 1 tablet by mouth daily.     . Probiotic Product (FORTIFY PROBIOTIC WOMENS EX ST PO)     . UNABLE TO FIND Hydro eye     No current facility-administered medications on file prior to visit.     Allergies  Allergen Reactions   . Sulfa Antibiotics Itching    Objective: Physical Exam  General: Well developed, nourished, no acute distress, awake, alert and oriented x 3  Vascular: Dorsalis pedis artery 2/4 bilateral, Posterior tibial artery 2/4 bilateral, skin temperature warm to warm proximal to distal bilateral lower extremities, no varicosities, pedal hair present bilateral.  Neurological: Gross sensation present via light touch bilateral.   Dermatological: Skin is warm, dry, and supple bilateral, Nails 1-10 are tender, short thick, and discolored with mild subungal debris with the right great toe most involved, no webspace macerations present bilateral, pinch callus present bilateral. No signs of infection bilateral.  Musculoskeletal: Asymptomatic bunion deformities noted bilateral. Muscular strength within normal limits without painon range of motion. No pain with calf compression bilateral.  Assessment and Plan:  Problem List Items Addressed This Visit    None    Visit Diagnoses    Nail fungus    -  Primary   Relevant Orders   Culture, fungus without smear   Toe pain, bilateral          -Examined patient -Discussed treatment options for painful dystrophic nails  -Fungal culture was obtained by removing a portion of the hard nail itself from each of the involved toenails 1 through 10 using a sterile nail nipper and sent to [facility] lab. Patient tolerated the biopsy procedure well without discomfort or need for  anesthesia.  -Advised patient good supportive shoes daily for foot type and made patient aware that some of these changes may be mechanical due to her history of hiking and walking as well as toe deformity -Patient to return in 4 weeks for follow up evaluation and discussion of fungal culture results or sooner if symptoms worsen.  Landis Martins, DPM

## 2019-05-07 ENCOUNTER — Encounter: Payer: Self-pay | Admitting: Certified Nurse Midwife

## 2019-05-07 ENCOUNTER — Ambulatory Visit (INDEPENDENT_AMBULATORY_CARE_PROVIDER_SITE_OTHER): Payer: Medicare Other | Admitting: Certified Nurse Midwife

## 2019-05-07 ENCOUNTER — Other Ambulatory Visit (HOSPITAL_COMMUNITY)
Admission: RE | Admit: 2019-05-07 | Discharge: 2019-05-07 | Disposition: A | Discharge status: Home / Self Care | Payer: Medicare Other | Source: Ambulatory Visit | Attending: Obstetrics & Gynecology | Admitting: Obstetrics & Gynecology

## 2019-05-07 ENCOUNTER — Other Ambulatory Visit: Payer: Self-pay

## 2019-05-07 VITALS — BP 120/72 | HR 64 | Temp 97.2°F | Resp 16 | Ht 67.75 in | Wt 142.0 lb

## 2019-05-07 DIAGNOSIS — Z124 Encounter for screening for malignant neoplasm of cervix: Secondary | ICD-10-CM | POA: Diagnosis not present

## 2019-05-07 DIAGNOSIS — Z01419 Encounter for gynecological examination (general) (routine) without abnormal findings: Secondary | ICD-10-CM

## 2019-05-07 DIAGNOSIS — E559 Vitamin D deficiency, unspecified: Secondary | ICD-10-CM

## 2019-05-07 NOTE — Patient Instructions (Signed)

## 2019-05-07 NOTE — Progress Notes (Signed)
68 y.o. [id number] Married  Caucasian Fe here for annual exam. Post menopausal denies vaginal bleeding or vaginal dryness, uses coconut oil with good results.. Has lost weight 5 lbs. while daughter and family living with them in the summer. Continues with Vitamin D supplement of 5000 IU daily. Sees Dr. Nyra Capes prn. Labs here. Has seen cardiology and all good. Labs normal per patient. Doing the trail walk this year, to help with exercise. No other health issues today.  Patient's last menstrual period was 06/15/2003.          Sexually active: Yes.    The current method of family planning is tubal ligation.    Exercising: Yes.    walking Smoker:  no  Review of Systems  Constitutional: Negative.   HENT: Negative.   Eyes: Negative.   Respiratory: Negative.   Cardiovascular: Negative.   Gastrointestinal: Negative.   Genitourinary: Negative.   Musculoskeletal: Negative.   Skin: Negative.   Neurological: Negative.   Endo/Heme/Allergies: Negative.   Psychiatric/Behavioral: Negative.     Health Maintenance: Pap:  03-03-16 neg HPV HR neg History of Abnormal Pap: no MMG:  12-02-2018 category b density birads 1:neg Self Breast exams: yes Colonoscopy:  2013 neg f/u 22yrs BMD:   2018 osteopenia TDaP:  2011 Shingles: 2013, 2018 Pneumonia: 2018, 2019 Hep C and HIV: hep c neg 2017, HIV neg yrs ago Labs: yes   reports that she has never smoked. She has never used smokeless tobacco. She reports current alcohol use of about 4.0 standard drinks of alcohol per week. She reports that she does not use drugs.  Past Medical History:  Diagnosis Date  . Depression 12/01/2017  . Family history of long QT syndrome 12/01/2017  . Hyperlipidemia 12/01/2017  . Left atrial enlargement 12/27/2014  . Mitral valve prolapse 12/01/2017  . MVP (mitral valve prolapse)   . Osteopenia   . Palpitation 12/26/2014  . Pectus excavatum 12/01/2017  . Thoracic scoliosis 12/01/2017  . Wrist fracture, right 07/2013   slipped on ice     Past Surgical History:  Procedure Laterality Date  . [place] SURGERY  2007  . L5 discectomy    . TUBAL LIGATION  1993    Current Outpatient Medications  Medication Sig Dispense Refill  . Biotin 1 MG CAPS Take by mouth daily.    . Coenzyme Q10 (CO Q 10 PO) Take by mouth daily.    . Cranberry 500 MG CAPS Take 1 capsule by mouth daily.    . naproxen sodium (ALEVE) 220 MG tablet Take by mouth as needed.    . Omega-3 Fatty Acids (FISH OIL PO) Take 1 tablet by mouth daily.     . Probiotic Product (FORTIFY PROBIOTIC WOMENS EX ST PO)     . VITAMIN D PO Take 5,000 Int'l Units by mouth.    Marland Kitchen UNABLE TO FIND Hydro eye     No current facility-administered medications for this visit.     Family History  Problem Relation Age of Onset  . Hypertension Mother   . Mitral valve prolapse Mother   . Hypertension Father   . Alzheimer's disease Father   . Diabetes Maternal Grandmother   . Diabetes Paternal Grandfather   . Breast cancer Maternal Aunt     ROS:  Pertinent items are noted in HPI.  Otherwise, a comprehensive ROS was negative.  Exam:   BP 120/72   Pulse 64   Temp (!) 97.2 F (36.2 C) (Skin)   Resp 16  Ht 5' 7.75" (1.721 m)   Wt 142 lb (64.4 kg)   LMP 06/15/2003   BMI 21.75 kg/m  Height: 5' 7.75" (172.1 cm) Ht Readings from Last 3 Encounters:  05/07/19 5' 7.75" (1.721 m)  12/21/18 5' 8.5" (1.74 m)  04/13/18 5' 7.75" (1.721 m)    General appearance: alert, cooperative and appears stated age Head: Normocephalic, without obvious abnormality, atraumatic Neck: no adenopathy, supple, symmetrical, trachea midline and thyroid normal to inspection and palpation Lungs: clear to auscultation bilaterally Breasts: normal appearance, no masses or tenderness, No nipple retraction or dimpling, No nipple discharge or bleeding, No axillary or supraclavicular adenopathy Heart: regular rate and rhythm Abdomen: soft, non-tender; no masses,  no organomegaly Extremities: extremities  normal, atraumatic, no cyanosis or edema Skin: Skin color, texture, turgor normal. No rashes or lesions Lymph nodes: Cervical, supraclavicular, and axillary nodes normal. No abnormal inguinal nodes palpated Neurologic: Grossly normal   Pelvic: External genitalia:  no lesions, except for small round keratin appearing skin lesion on left of mons              Urethra:  normal appearing urethra with no masses, tenderness or lesions              Bartholin's and Skene's: normal                 Vagina: slight pale color, but normal appearing vagina with normal scant  discharge, no lesions              Cervix: no bleeding following Pap, no cervical motion tenderness, no lesions and normal appearance              Pap taken: Yes.   Bimanual Exam:  Uterus:  normal size, contour, position, consistency, mobility, non-tender and anteverted              Adnexa: normal adnexa and no mass, fullness, tenderness               Rectovaginal: Confirms               Anus:  normal sphincter tone, no lesions  Chaperone present: yes  A:  Well Woman with normal exam  Post menopausal no HRT  Small keratin lesion on Mons on left  History of Vitamin D deficiency, follow up lab today  BMD due patient to schedule, order to be faxed  Cardiology follow up of cholesterol and palpitation history  P:   Reviewed health and wellness pertinent to exam  Aware of need to advise if vaginal bleeding. Continue coconut oil for dryness.  Patient has dermatology appointment in December and will have her check.  Lab: Vitamin D  Order signed to be faxed  Continue follow up as indicated.  Pap smear: yes   counseled on breast self exam, mammography screening, feminine hygiene, menopause, adequate intake of calcium and vitamin D, diet and exercise, Kegel's exercises  return annually or prn  An After Visit Summary was printed and given to the patient.

## 2019-05-08 LAB — CYTOLOGY - PAP: Diagnosis: NEGATIVE

## 2019-05-08 LAB — VITAMIN D 25 HYDROXY (VIT D DEFICIENCY, FRACTURES): Vit D, 25-Hydroxy: 55.2 ng/mL (ref 30.0–100.0)

## 2019-06-06 ENCOUNTER — Telehealth (INDEPENDENT_AMBULATORY_CARE_PROVIDER_SITE_OTHER): Payer: Medicare Other | Admitting: Sports Medicine

## 2019-06-06 DIAGNOSIS — B351 Tinea unguium: Secondary | ICD-10-CM | POA: Diagnosis not present

## 2019-06-06 DIAGNOSIS — M79675 Pain in left toe(s): Secondary | ICD-10-CM

## 2019-06-06 DIAGNOSIS — M79674 Pain in right toe(s): Secondary | ICD-10-CM

## 2019-06-06 NOTE — Progress Notes (Signed)
Virtual Visit via Telephone Note  I connected with Kelli Saunders on 06/06/19 at  5:30 PM EST by telephone and verified that I am speaking with the correct person using two identifiers.  Location: Patient: Kelli Saunders  Provider: Landis Martins, DPM    I discussed the limitations, risks, security and privacy concerns of performing an evaluation and management service by telephone and the availability of in person appointments. I also discussed with the patient that there may be a patient responsible charge related to this service. The patient expressed understanding and agreed to proceed.   History of Present Illness: Thickness and discoloration at right great toenail Review of Fungal culture results    Observations/Objective: Unable to perform exam due to telephone visit  Assessment and Plan: Nail fungus + Saprophyte and Yeast  Follow Up Instructions: Rx sent to [facility] for topical with instructions to add itraconazole for + culture; patient was educated on how to use the topical to her right great toenail Patient declines at this time PO treatment Advised patient to also consider laser if she is not making progress with topical. Patient to call office to arrange laser when ready Patient to follow up as needed.   I discussed the assessment and treatment plan with the patient. The patient was provided an opportunity to ask questions and all were answered. The patient agreed with the plan and demonstrated an understanding of the instructions.   The patient was advised to call back or seek an in-person evaluation if the symptoms worsen or if the condition fails to improve as anticipated.  I provided 15 minutes of non-face-to-face time during this encounter.   Landis Martins, DPM

## 2019-06-10 ENCOUNTER — Encounter: Payer: Self-pay | Admitting: Sports Medicine

## 2019-08-13 ENCOUNTER — Ambulatory Visit: Payer: Medicare PPO | Admitting: Podiatry

## 2019-08-13 ENCOUNTER — Ambulatory Visit (INDEPENDENT_AMBULATORY_CARE_PROVIDER_SITE_OTHER): Payer: Medicare PPO | Admitting: *Deleted

## 2019-08-13 ENCOUNTER — Ambulatory Visit (INDEPENDENT_AMBULATORY_CARE_PROVIDER_SITE_OTHER): Payer: Medicare PPO

## 2019-08-13 ENCOUNTER — Other Ambulatory Visit: Payer: Self-pay

## 2019-08-13 ENCOUNTER — Encounter: Payer: Self-pay | Admitting: Certified Nurse Midwife

## 2019-08-13 DIAGNOSIS — M7752 Other enthesopathy of left foot: Secondary | ICD-10-CM

## 2019-08-13 DIAGNOSIS — M76822 Posterior tibial tendinitis, left leg: Secondary | ICD-10-CM

## 2019-08-13 DIAGNOSIS — M779 Enthesopathy, unspecified: Secondary | ICD-10-CM

## 2019-08-13 DIAGNOSIS — B351 Tinea unguium: Secondary | ICD-10-CM

## 2019-08-13 DIAGNOSIS — M76829 Posterior tibial tendinitis, unspecified leg: Secondary | ICD-10-CM

## 2019-08-13 MED ORDER — MELOXICAM 15 MG PO TABS: 15.0000 mg | ORAL_TABLET | Freq: Every day | ORAL | 0 refills | Status: DC

## 2019-08-13 NOTE — Patient Instructions (Signed)

## 2019-08-13 NOTE — Progress Notes (Signed)
Patient presents today for the 1st laser treatment. Diagnosed with mycotic nail infection by Dr. Cannon Kettle. Toenail most affected the hallux right, medial border.  All other systems are negative.  Nails were filed thin. Laser therapy was administered to 1st toenails right and patient tolerated the treatment well. All safety precautions were in place.   Patient is also using a topical antifungal.  Follow up in 4 weeks for laser # 2.  Picture of nails taken today to document visual progress  Patient complaining of severe left medial ankle pain today and asking if there is a physician who could see her today-referred to Dr. Jacqualyn Posey.

## 2019-08-20 ENCOUNTER — Telehealth: Payer: Self-pay

## 2019-08-20 NOTE — Progress Notes (Signed)
Subjective: 69 year old female presents the office today initially for laser treatment but while she was here she asked to be seen for left ankle pain.  She states that she walks normally however she recently started a new exercise program and there is been more stepping at higher impact.  Since then she has noticed some increased pain and swelling to the medial aspect of the ankle for the last 10 days.  She has tried ice and Aleve which helps some but still having discomfort.  No injury that she can recall. Denies any systemic complaints such as fevers, chills, nausea, vomiting. No acute changes since last appointment, and no other complaints at this time.   Objective: AAO x3, NAD DP/PT pulses palpable bilaterally, CRT less than 3 seconds There is tenderness on the medial aspect of left ankle mostly on the course of the posterior tibial, flexor tendons.  Overall the tendons appear to be intact.  There is no area of pinpoint tenderness.  No pain with Achilles tendon.  Thompson test is negative.  Flexor, extensor tendons appear to be intact.  There is edema to the medial aspect ankle but there is no erythema or warmth No open lesions or pre-ulcerative lesions.  No pain with calf compression, swelling, warmth, erythema  Assessment: Posterior tibial tendinitis, flexor tendinitis   Plan: -All treatment options discussed with the patient including all alternatives, risks, complications.  -X-rays obtained reviewed.  No evidence of acute fracture or stress fracture identified today. -Recommend immobilization in the cam boot which was dispensed. -Prescribed mobic. Discussed side effects of the medication and directed to stop if any are to occur and call the office.  Alvera Singh -Patient encouraged to call the office with any questions, concerns, change in symptoms.   Return in about 2 weeks (around 08/27/2019).  At that time if she is doing better we will start with stretching, rehab exercises.  If no  improvement recommend MRI.  Trula Slade DPM

## 2019-08-20 NOTE — Telephone Encounter (Signed)
Patient notified of bone density results. See scanned in result. 

## 2019-08-29 ENCOUNTER — Ambulatory Visit: Payer: Medicare PPO | Admitting: Sports Medicine

## 2019-08-29 ENCOUNTER — Encounter: Payer: Self-pay | Admitting: Sports Medicine

## 2019-08-29 ENCOUNTER — Other Ambulatory Visit: Payer: Self-pay

## 2019-08-29 DIAGNOSIS — M25572 Pain in left ankle and joints of left foot: Secondary | ICD-10-CM | POA: Diagnosis not present

## 2019-08-29 DIAGNOSIS — B351 Tinea unguium: Secondary | ICD-10-CM | POA: Diagnosis not present

## 2019-08-29 DIAGNOSIS — M76822 Posterior tibial tendinitis, left leg: Secondary | ICD-10-CM | POA: Diagnosis not present

## 2019-08-29 DIAGNOSIS — M779 Enthesopathy, unspecified: Secondary | ICD-10-CM

## 2019-08-29 DIAGNOSIS — M778 Other enthesopathies, not elsewhere classified: Secondary | ICD-10-CM

## 2019-08-29 DIAGNOSIS — M76829 Posterior tibial tendinitis, unspecified leg: Secondary | ICD-10-CM

## 2019-08-29 MED ORDER — PREDNISONE 10 MG (21) PO TBPK: ORAL_TABLET | ORAL | 0 refills | Status: DC

## 2019-08-29 NOTE — Progress Notes (Signed)
Subjective:  Kelli Saunders is a 69 y.o. female patient who returns  to office for follow up evaluation of left ankle pain. Patient has been using CAM boot and took Mobic as Rx'd by Dr. Earleen Newport, d/c on Monday since used for 2 weeks as recommended, reports that swelling has gone done but complains of continued pain in the ankle, pain 3/10 unable to walk fast/run due to pain.  Patient denies any other pedal complaints. Denies new injury/trip/fall/sprain/any causative factors.   Patient Active Problem List   Diagnosis Date Noted  . Mitral valve prolapse 12/01/2017  . Depression 12/01/2017  . Hyperlipidemia 12/01/2017  . Family history of long QT syndrome 12/01/2017  . Osteopenia 12/01/2017  . Pectus excavatum 12/01/2017  . Thoracic scoliosis 12/01/2017  . Left atrial enlargement 12/27/2014  . Palpitation 12/26/2014    Current Outpatient Medications on File Prior to Visit  Medication Sig Dispense Refill  . Biotin 1 MG CAPS Take by mouth daily.    . Coenzyme Q10 (CO Q 10 PO) Take by mouth daily.    . Cranberry 500 MG CAPS Take 1 capsule by mouth daily.    . meloxicam (MOBIC) 15 MG tablet Take 1 tablet (15 mg total) by mouth daily. 30 tablet 0  . naproxen sodium (ALEVE) 220 MG tablet Take by mouth as needed.    . Omega-3 Fatty Acids (FISH OIL PO) Take 1 tablet by mouth daily.     . Probiotic Product (FORTIFY PROBIOTIC WOMENS EX ST PO)     . UNABLE TO FIND Hydro eye    . VITAMIN D PO Take 5,000 Int'l Units by mouth.     No current facility-administered medications on file prior to visit.    Allergies  Allergen Reactions  . Sulfa Antibiotics Itching    Objective:  General: Alert and oriented x3 in no acute distress  Dermatology: No open lesions bilateral lower extremities, no webspace macerations, no ecchymosis bilateral, all nails x 10 are short and thick undergoing laser treatment with right 1st toenail most involved.   Vascular: Dorsalis Pedis and Posterior Tibial pedal  pulses palpable, Capillary Fill Time 3 seconds,(+) pedal hair growth bilateral, no edema bilateral lower extremities, Temperature gradient within normal limits.  Neurology: Johney Maine sensation intact via light touch bilateral.   Musculoskeletal: Mild tenderness with palpation at Left foot/ankle at PT tendon course>Extensor tendons. Negative talar tilt, Negative tib-fib stress, No instability. No pain with calf compression bilateral. Range of motion within normal limits with mild guarding on Left ankle. Strength within normal limits in all groups bilateral.   Gait: Minimally Antalgic   Assessment and Plan: Problem List Items Addressed This Visit    None    Visit Diagnoses    Tendonitis    -  Primary   PTTD (posterior tibial tendon dysfunction)       Acute left ankle pain       Nail fungus          -Complete examination performed -Xrays reviewed -Discussed treatment options -Continue with laser for right 1st toenail and topical medication to the area -Rx Prednisone dose pak -Dispensed trilock brace to wear for 2 weeks -Recommend rest, ice, elevation and good supportive shoes  -Patient to return to office in 3 weeks or sooner if condition worsens. If better we will discuss rehab/exercises if still no improvement plan to discuss MRI.   Landis Martins, DPM

## 2019-08-29 NOTE — Patient Instructions (Signed)
For tennis shoes recommend:  Kandy Garrison Ascis New balance Saucony Can be purchased at [employer] or [employer]  Vionic  SAS Can be purchased at [employer] or [employer] Can be purchased at any major retailers   For work shoes recommend: [employer] Work [employer]  Can be purchased at a variety of places or Engineer, maintenance (IT)   For casual shoes recommend: Vionic  Can be purchased at [employer] or [employer]   For Over the [employer] recommend: [employer] Can be purchased in office/[employer] and [place] Can be purchased at [employer] or [employer] Can be purchased at [employer]

## 2019-09-04 ENCOUNTER — Encounter: Payer: Self-pay | Admitting: Certified Nurse Midwife

## 2019-09-12 ENCOUNTER — Ambulatory Visit: Payer: Medicare PPO | Admitting: Sports Medicine

## 2019-09-12 ENCOUNTER — Encounter: Payer: Self-pay | Admitting: Sports Medicine

## 2019-09-12 ENCOUNTER — Other Ambulatory Visit: Payer: Self-pay

## 2019-09-12 DIAGNOSIS — M779 Enthesopathy, unspecified: Secondary | ICD-10-CM | POA: Diagnosis not present

## 2019-09-12 DIAGNOSIS — M76829 Posterior tibial tendinitis, unspecified leg: Secondary | ICD-10-CM

## 2019-09-12 DIAGNOSIS — M25572 Pain in left ankle and joints of left foot: Secondary | ICD-10-CM | POA: Diagnosis not present

## 2019-09-12 DIAGNOSIS — B351 Tinea unguium: Secondary | ICD-10-CM | POA: Diagnosis not present

## 2019-09-12 NOTE — Progress Notes (Signed)
Subjective:  Kelli Saunders is a 69 y.o. female patient who returns  to office for follow up evaluation of left ankle pain. Patient reports that pain is is a little better but has pain especially when she has done a lot of walking or standing pain is 5 out of 10 at the medial and anterior ankle on the left.  Patient reports that she feels supported and her pain feels better when she is wearing her Tri-Lock brace and using her over-the-counter insoles.  Patient reports that she has been occasionally having some swelling but denies any other pedal complaints at this time.  Patient will go back for laser treatment for her right great toenail on Monday.  No other issues noted.  Patient Active Problem List   Diagnosis Date Noted  . Mitral valve prolapse 12/01/2017  . Depression 12/01/2017  . Hyperlipidemia 12/01/2017  . Family history of long QT syndrome 12/01/2017  . Osteopenia 12/01/2017  . Pectus excavatum 12/01/2017  . Thoracic scoliosis 12/01/2017  . Left atrial enlargement 12/27/2014  . Palpitation 12/26/2014    Current Outpatient Medications on File Prior to Visit  Medication Sig Dispense Refill  . Biotin 1 MG CAPS Take by mouth daily.    . Coenzyme Q10 (CO Q 10 PO) Take by mouth daily.    . Cranberry 500 MG CAPS Take 1 capsule by mouth daily.    . meloxicam (MOBIC) 15 MG tablet Take 1 tablet (15 mg total) by mouth daily. 30 tablet 0  . naproxen sodium (ALEVE) 220 MG tablet Take by mouth as needed.    . Omega-3 Fatty Acids (FISH OIL PO) Take 1 tablet by mouth daily.     . predniSONE (STERAPRED UNI-PAK 21 TAB) 10 MG (21) TBPK tablet Take as directed 21 tablet 0  . Probiotic Product (FORTIFY PROBIOTIC WOMENS EX ST PO)     . UNABLE TO FIND Hydro eye    . VITAMIN D PO Take 5,000 Int'l Units by mouth.     No current facility-administered medications on file prior to visit.    Allergies  Allergen Reactions  . Sulfa Antibiotics Itching    Objective:  General: Alert and  oriented x3 in no acute distress  Dermatology: No open lesions bilateral lower extremities, no webspace macerations, no ecchymosis bilateral, all nails x 10 are short and thick undergoing laser treatment with right 1st toenail most involved.   Vascular: Dorsalis Pedis and Posterior Tibial pedal pulses palpable, Capillary Fill Time 3 seconds,(+) pedal hair growth bilateral, no edema bilateral lower extremities, Temperature gradient within normal limits.  Neurology: Johney Maine sensation intact via light touch bilateral.   Musculoskeletal: Mild tenderness with palpation at Left foot/ankle at PT tendon course>Extensor tendons> Achilles insertion. Negative talar tilt, Negative tib-fib stress, No instability. No pain with calf compression bilateral. Range of motion within normal limits with mild guarding on Left ankle. Strength within normal limits in all groups bilateral.   Gait: Minimally Antalgic   Assessment and Plan: Problem List Items Addressed This Visit    None    Visit Diagnoses    Tendonitis    -  Primary   PTTD (posterior tibial tendon dysfunction)       Acute left ankle pain       Nail fungus          -Complete examination performed -Re-Discussed treatment options for tendonitis and nail fungus  -Continue with laser for right 1st toenail and topical medication to the right great toe -Rx PT  for left ankle tendinitis, [facility] physical therapy -Continue with trilock brace and air plus insoles -Recommend rest, ice, elevation and good supportive shoes  -Advised topical pain cream as needed to left ankle and Epson salt soaks as needed -Patient to return to office in 4-6 weeks or sooner if condition worsens.  Landis Martins, DPM

## 2019-09-17 ENCOUNTER — Other Ambulatory Visit: Payer: Self-pay

## 2019-09-17 ENCOUNTER — Ambulatory Visit (INDEPENDENT_AMBULATORY_CARE_PROVIDER_SITE_OTHER): Payer: Medicare PPO | Admitting: *Deleted

## 2019-09-17 DIAGNOSIS — B351 Tinea unguium: Secondary | ICD-10-CM

## 2019-09-17 NOTE — Progress Notes (Signed)
Patient presents today for the 2nd laser treatment. Diagnosed with mycotic nail infection by Dr. Cannon Kettle.   Toenail most affected the hallux right, medial border. There is a slight clearing at the base of the nail.  All other systems are negative.  Nails were filed thin. Laser therapy was administered to 1st toenails right and patient tolerated the treatment well. All safety precautions were in place.   Patient is also using a topical antifungal.  Follow up in 4 weeks for laser # 3.  Patient states her left ankle pain is feeling much better.

## 2019-10-12 ENCOUNTER — Other Ambulatory Visit: Payer: Medicare PPO

## 2019-10-18 ENCOUNTER — Ambulatory Visit: Payer: Medicare PPO | Admitting: Sports Medicine

## 2019-10-26 ENCOUNTER — Ambulatory Visit: Payer: Medicare PPO | Admitting: Sports Medicine

## 2019-10-26 ENCOUNTER — Other Ambulatory Visit: Payer: Medicare PPO

## 2019-10-26 ENCOUNTER — Other Ambulatory Visit: Payer: Self-pay

## 2019-10-26 ENCOUNTER — Encounter: Payer: Self-pay | Admitting: Sports Medicine

## 2019-10-26 DIAGNOSIS — M25572 Pain in left ankle and joints of left foot: Secondary | ICD-10-CM | POA: Diagnosis not present

## 2019-10-26 DIAGNOSIS — M76829 Posterior tibial tendinitis, unspecified leg: Secondary | ICD-10-CM

## 2019-10-26 DIAGNOSIS — B351 Tinea unguium: Secondary | ICD-10-CM

## 2019-10-26 DIAGNOSIS — M779 Enthesopathy, unspecified: Secondary | ICD-10-CM

## 2019-10-26 DIAGNOSIS — M722 Plantar fascial fibromatosis: Secondary | ICD-10-CM

## 2019-10-26 NOTE — Progress Notes (Signed)
Subjective:  Kelli Saunders is a 69 y.o. female patient who returns  to office for follow up evaluation of left ankle pain. Patient reports that pain is doing much better with physical therapy pain is 3 out of 10 also reports that she got new shoes as well as new over-the-counter insoles which have been helping but does notice that she has a new issue with some soreness on the bottom of her right foot, feels like her heel is bruised reports that her last physical therapy session may advised her to do some stretching for the right but otherwise she has not tried any other treatments for this new problem.  Patient also reports that she has been going for laser nail treatment and she feels as if after the second treatment she is starting to slowly see the thickened part of her nail grow out she has been consistent as well using topical antifungal.No other issues noted.   Patient Active Problem List   Diagnosis Date Noted  . Mitral valve prolapse 12/01/2017  . Depression 12/01/2017  . Hyperlipidemia 12/01/2017  . Family history of long QT syndrome 12/01/2017  . Osteopenia 12/01/2017  . Pectus excavatum 12/01/2017  . Thoracic scoliosis 12/01/2017  . Left atrial enlargement 12/27/2014  . Palpitation 12/26/2014    Current Outpatient Medications on File Prior to Visit  Medication Sig Dispense Refill  . Biotin 1 MG CAPS Take by mouth daily.    . Coenzyme Q10 (CO Q 10 PO) Take by mouth daily.    . Cranberry 500 MG CAPS Take 1 capsule by mouth daily.    . naproxen sodium (ALEVE) 220 MG tablet Take by mouth as needed.    . Omega-3 Fatty Acids (FISH OIL PO) Take 1 tablet by mouth daily.     . Probiotic Product (FORTIFY PROBIOTIC WOMENS EX ST PO)     . UNABLE TO FIND Hydro eye    . VITAMIN D PO Take 5,000 Int'l Units by mouth.     No current facility-administered medications on file prior to visit.    Allergies  Allergen Reactions  . Sulfa Antibiotics Itching    Objective:  General: Alert  and oriented x3 in no acute distress  Dermatology: No open lesions bilateral lower extremities, no webspace macerations, no ecchymosis bilateral, all nails x 10 are short and thick undergoing laser treatment with right 1st toenail most involved with proximal clearance noted.   Vascular: Dorsalis Pedis and Posterior Tibial pedal pulses palpable, Capillary Fill Time 3 seconds,(+) pedal hair growth bilateral, no edema bilateral lower extremities, Temperature gradient within normal limits.  Neurology: Johney Maine sensation intact via light touch bilateral.   Musculoskeletal: Decreased tenderness with palpation at Left foot/ankle at PT tendon course>Extensor tendons> Achilles insertion appears to be much improved from prior. Negative talar tilt, Negative tib-fib stress, No instability. No pain with calf compression bilateral.  There is no pain to palpation to the plantar fascial insertion on the right heel extending to the arch, new problem.  Mild limited ankle joint range of motion right greater than left consistent with equinus.  Strength within normal limits in all groups bilateral.    Assessment and Plan: Problem List Items Addressed This Visit    None    Visit Diagnoses    Tendonitis    -  Primary   Nail fungus       PTTD (posterior tibial tendon dysfunction)       Acute left ankle pain  Plantar fasciitis, right          -Complete examination performed -Re-Discussed treatment options for tendonitis on left and new plantar fasciitis on right  -Advised patient to continue with physical therapy, we will add on therapy for her right foot as well -Encouraged good supportive shoes rest ice elevation and stretching as instructed by physical therapy -May use over-the-counter pain cream as needed for painful areas on the left or right foot -Discussed treatment options for nail fungus -Advised to continue with laser for right 1st toenail and topical medication to the right great toe as previous   -Patient to return to office as needed or sooner if condition worsens.  Landis Martins, DPM

## 2019-11-02 ENCOUNTER — Ambulatory Visit (INDEPENDENT_AMBULATORY_CARE_PROVIDER_SITE_OTHER): Payer: Self-pay | Admitting: *Deleted

## 2019-11-02 ENCOUNTER — Other Ambulatory Visit: Payer: Self-pay

## 2019-11-02 DIAGNOSIS — B351 Tinea unguium: Secondary | ICD-10-CM

## 2019-11-02 NOTE — Progress Notes (Signed)
Patient presents today for the 3rd laser treatment. Diagnosed with mycotic nail infection by Dr. Cannon Kettle.   Toenail most affected the hallux right, medial border. There is some clear new nail growth coming in at the base of the nail.   All other systems are negative.  Nails were filed thin. Laser therapy was administered to 1st toenails right and patient tolerated the treatment well. All safety precautions were in place.   Patient is also using a topical antifungal.  Follow up in 4 weeks for laser # 4.

## 2019-11-30 ENCOUNTER — Other Ambulatory Visit: Payer: Self-pay

## 2019-11-30 ENCOUNTER — Ambulatory Visit (INDEPENDENT_AMBULATORY_CARE_PROVIDER_SITE_OTHER): Payer: Medicare PPO | Admitting: *Deleted

## 2019-11-30 DIAGNOSIS — B351 Tinea unguium: Secondary | ICD-10-CM

## 2019-11-30 NOTE — Progress Notes (Signed)
Patient presents today for the 4th laser treatment. Diagnosed with mycotic nail infection by Dr. Cannon Kettle.   Toenail most affected the hallux right, medial border. The nail is clearing at the base, but growing out very slow.  All other systems are negative.  Nails were filed thin. Laser therapy was administered to 1st toenails right and patient tolerated the treatment well. All safety precautions were in place.   Patient is still using the topical antifungal nightly.   Follow up in 6 weeks for laser # 5 to allow for a little extra nail growth.

## 2020-01-06 NOTE — Progress Notes (Signed)
Cardiology Office Note:    Date:  01/07/2020   ID:  Kelli Saunders, DOB 11/12/1950, MRN [id number]  PCP:  Marco Collie, MD  Cardiologist:  Shirlee More, MD    Referring MD: Marco Collie, MD    ASSESSMENT:    1. Mitral valve prolapse   2. Cardiac risk counseling   3. Palpitation    PLAN:    In order of problems listed above:  1. She is done well no recurrent palpitation does not require suppressant treatment I do not think she needs a repeat echocardiogram without heart murmur although we will look at her office EKG prior to leaving with a family history of long QT syndrome 2. We had a discussion about cardiac calcium scoring decision making about cardiovascular risk benefit of statins and she will have one done at her request as an outpatient if score greater than 10 or greater and 75th percentile would benefit from lipid-lowering therapy 3. Counseled regarding COVID-19 I asked her to use medical eye protection when in public and especially if she is traveling by plane.   Next appointment: Of years to have years status I respect her opinion 1.  Decide next visit of 1 is no right answer   Medication Adjustments/Labs and Tests Ordered: Current medicines are reviewed at length with the patient today.  Concerns regarding medicines are outlined above.  Orders Placed This Encounter  Procedures  . CT CARDIAC SCORING  . EKG 12-Lead   No orders of the defined types were placed in this encounter.   No chief complaint on file.   History of Present Illness:    Kelli Saunders is a 69 y.o. female with a hx of  mitral valve prolapse palpitation and family history of long QT syndrome  last seen 12/21/2018. Compliance with diet, lifestyle and medications: Yes  She has done well she is not had palpitation but she had a home visit by night health and they told her she had arrhythmia.  They did not do an EKG.  She has had no chest pain shortness of breath palpitation or syncope  she has had Covid vaccine and has good healthcare literacy asked me my opinion about flying to [place] in October told her not to make the trip. Past Medical History:  Diagnosis Date  . Depression 12/01/2017  . Family history of long QT syndrome 12/01/2017  . Hyperlipidemia 12/01/2017  . Left atrial enlargement 12/27/2014  . Mitral valve prolapse 12/01/2017  . MVP (mitral valve prolapse)   . Osteopenia   . Palpitation 12/26/2014  . Pectus excavatum 12/01/2017  . Thoracic scoliosis 12/01/2017  . Wrist fracture, right 07/2013   slipped on ice    Past Surgical History:  Procedure Laterality Date  . [place] SURGERY  2007  . L5 discectomy    . TUBAL LIGATION  1993    Current Medications: Current Meds  Medication Sig  . Biotin 1 MG CAPS Take by mouth daily.  . Calcium Carb-Cholecalciferol (CALCIUM 600 + D PO) Take 1 tablet by mouth daily.  . Coenzyme Q10 (CO Q 10 PO) Take by mouth daily.  . Cranberry 500 MG CAPS Take 1 capsule by mouth daily.  . naproxen sodium (ALEVE) 220 MG tablet Take by mouth as needed.  . Omega-3 Fatty Acids (FISH OIL PO) Take 1 tablet by mouth daily.   . Probiotic Product (FORTIFY PROBIOTIC WOMENS EX ST PO)   . UNABLE TO FIND Hydro eye  . VITAMIN D PO  Take 5,000 Int'l Units by mouth.     Allergies:   Sulfa antibiotics   Social History   Socioeconomic History  . Marital status: Married    Spouse name: Not on file  . Number of children: Not on file  . Years of education: Not on file  . Highest education level: Not on file  Occupational History  . Not on file  Tobacco Use  . Smoking status: Never Smoker  . Smokeless tobacco: Never Used  Vaping Use  . Vaping Use: Never used  Substance and Sexual Activity  . Alcohol use: Yes    Alcohol/week: 4.0 standard drinks    Types: 4 Glasses of wine per week    Comment: 1 glass of wine 4 times per week   . Drug use: No  . Sexual activity: Yes    Partners: Male    Birth control/protection: Surgical     Comment: BTL  Other Topics Concern  . Not on file  Social History Narrative  . Not on file   Social Determinants of Health   Financial Resource Strain:   . Difficulty of Paying Living Expenses:   Food Insecurity:   . Worried About Charity fundraiser in the Last Year:   . Arboriculturist in the Last Year:   Transportation Needs:   . Film/video editor (Medical):   Marland Kitchen Lack of Transportation (Non-Medical):   Physical Activity:   . Days of Exercise per Week:   . Minutes of Exercise per Session:   Stress:   . Feeling of Stress :   Social Connections:   . Frequency of Communication with Friends and Family:   . Frequency of Social Gatherings with Friends and Family:   . Attends Religious Services:   . Active Member of Clubs or Organizations:   . Attends Archivist Meetings:   Marland Kitchen Marital Status:      Family History: The patient's family history includes Alzheimer's disease in her father; Breast cancer in her maternal aunt; Diabetes in her maternal grandmother and paternal grandfather; Hypertension in her father and mother; Mitral valve prolapse in her mother. ROS:   Please see the history of present illness.    All other systems reviewed and are negative.  EKGs/Labs/Other Studies Reviewed:    The following studies were reviewed today:  EKG:  EKG ordered today and personally reviewed.  The ekg ordered today demonstrates normal QT interval sinus rhythm  Recent Labs: No results found for requested labs within last 8760 hours.  Recent Lipid Panel    Component Value Date/Time   CHOL 177 12/21/2018 0956   TRIG 61 12/21/2018 0956   HDL 70 12/21/2018 0956   CHOLHDL 2.5 12/21/2018 0956   CHOLHDL 2.4 03/03/2016 1025   VLDL 11 03/03/2016 1025   LDLCALC 95 12/21/2018 0956    Physical Exam:    VS:  BP 90/66 (BP Location: Right Arm, Patient Position: Sitting)   Pulse 68   Ht 5\' 8"  (1.727 m)   Wt 145 lb 9.6 oz (66 kg)   LMP 06/15/2003   SpO2 98%   BMI 22.14 kg/m      Wt Readings from Last 3 Encounters:  01/07/20 145 lb 9.6 oz (66 kg)  05/07/19 142 lb (64.4 kg)  12/21/18 141 lb (64 kg)     GEN:  Well nourished, well developed in no acute distress HEENT: Normal NECK: No JVD; No carotid bruits LYMPHATICS: No lymphadenopathy CARDIAC: Midsystolic click RRR, no murmurs,  rubs, gallops RESPIRATORY:  Clear to auscultation without rales, wheezing or rhonchi  ABDOMEN: Soft, non-tender, non-distended MUSCULOSKELETAL:  No edema; No deformity  SKIN: Warm and dry NEUROLOGIC:  Alert and oriented x 3 PSYCHIATRIC:  Normal affect    Signed, Shirlee More, MD  01/07/2020 2:45 PM    [facility]

## 2020-01-07 ENCOUNTER — Other Ambulatory Visit: Payer: Self-pay

## 2020-01-07 ENCOUNTER — Encounter: Payer: Self-pay | Admitting: Cardiology

## 2020-01-07 ENCOUNTER — Ambulatory Visit: Payer: Medicare PPO | Admitting: Cardiology

## 2020-01-07 VITALS — BP 90/66 | HR 68 | Ht 68.0 in | Wt 145.6 lb

## 2020-01-07 DIAGNOSIS — I341 Nonrheumatic mitral (valve) prolapse: Secondary | ICD-10-CM | POA: Diagnosis not present

## 2020-01-07 DIAGNOSIS — R002 Palpitations: Secondary | ICD-10-CM

## 2020-01-07 DIAGNOSIS — Z7189 Other specified counseling: Secondary | ICD-10-CM | POA: Diagnosis not present

## 2020-01-07 NOTE — Patient Instructions (Addendum)
Medication Instructions:  Your physician recommends that you continue on your current medications as directed. Please refer to the Current Medication list given to you today.  *If you need a refill on your cardiac medications before your next appointment, please call your pharmacy*   Lab Work: None If you have labs (blood work) drawn today and your tests are completely normal, you will receive your results only by: Marland Kitchen MyChart Message (if you have MyChart) OR . A paper copy in the mail If you have any lab test that is abnormal or we need to change your treatment, we will call you to review the results.   Testing/Procedures: We have put in an order for you to have a CT cardiac score done. They will call you to schedule this appointment.    Follow-Up: At [facility], you and your health needs are our priority.  As part of our continuing mission to provide you with exceptional heart care, we have created designated Provider Care Teams.  These Care Teams include your primary Cardiologist (physician) and Advanced Practice Providers (APPs -  Physician Assistants and Nurse Practitioners) who all work together to provide you with the care you need, when you need it.  We recommend signing up for the patient portal called "MyChart".  Sign up information is provided on this After Visit Summary.  MyChart is used to connect with patients for Virtual Visits (Telemedicine).  Patients are able to view lab/test results, encounter notes, upcoming appointments, etc.  Non-urgent messages can be sent to your provider as well.   To learn more about what you can do with MyChart, go to [email or web address].    Your next appointment:   1 year(s)  The format for your next appointment:   In Person  Provider:   Shirlee More, MD   Other Instructions Purchase on line at [facility] or at [facility]  .

## 2020-01-11 ENCOUNTER — Ambulatory Visit (INDEPENDENT_AMBULATORY_CARE_PROVIDER_SITE_OTHER): Payer: Medicare PPO | Admitting: *Deleted

## 2020-01-11 ENCOUNTER — Other Ambulatory Visit: Payer: Self-pay

## 2020-01-11 DIAGNOSIS — B351 Tinea unguium: Secondary | ICD-10-CM

## 2020-01-11 NOTE — Progress Notes (Signed)
Patient presents today for the 5th laser treatment. Diagnosed with mycotic nail infection by Dr. Cannon Kettle.   Toenail most affected the hallux right, medial border. The nail is slow to grow out, but there is a some new nail growth starting to show at the base.  All other systems are negative.  Nails were filed thin. Laser therapy was administered to 1st toenails right and patient tolerated the treatment well. All safety precautions were in place.   Patient is still using the topical antifungal nightly.   Follow up in 6 weeks for laser # 6 to allow for a little extra nail growth.

## 2020-01-18 ENCOUNTER — Other Ambulatory Visit: Payer: Self-pay

## 2020-01-18 DIAGNOSIS — E785 Hyperlipidemia, unspecified: Secondary | ICD-10-CM

## 2020-01-18 LAB — LIPID PANEL
Chol/HDL Ratio: 2.4 ratio (ref 0.0–4.4)
Cholesterol, Total: 165 mg/dL (ref 100–199)
HDL: 68 mg/dL (ref >39)
LDL Chol Calc (NIH): 86 mg/dL (ref 0–99)
Triglycerides: 56 mg/dL (ref 0–149)
VLDL Cholesterol Cal: 11 mg/dL (ref 5–40)

## 2020-01-18 NOTE — Progress Notes (Signed)
Lipid panel order placed at this time

## 2020-01-21 ENCOUNTER — Telehealth: Payer: Self-pay

## 2020-01-21 NOTE — Telephone Encounter (Signed)
Spoke with patient regarding results and recommendation.  Patient verbalizes understanding and is agreeable to plan of care. Advised patient to call back with any issues or concerns.  

## 2020-01-22 ENCOUNTER — Ambulatory Visit (INDEPENDENT_AMBULATORY_CARE_PROVIDER_SITE_OTHER)
Admission: RE | Admit: 2020-01-22 | Discharge: 2020-01-22 | Disposition: A | Discharge status: Home / Self Care | Payer: Self-pay | Source: Ambulatory Visit | Attending: Cardiology | Admitting: Cardiology

## 2020-01-22 ENCOUNTER — Other Ambulatory Visit: Payer: Self-pay

## 2020-01-22 DIAGNOSIS — Z7189 Other specified counseling: Secondary | ICD-10-CM

## 2020-01-23 ENCOUNTER — Telehealth: Payer: Self-pay

## 2020-01-23 NOTE — Telephone Encounter (Signed)
-----   Message from Berniece Salines, DO sent at 01/23/2020 10:22 AM EDT ----- Doristine Devoid news, Carson score 0.  No further testing at this time for coronary arteries needs to be performed.

## 2020-01-23 NOTE — Telephone Encounter (Signed)
Spoke with patient regarding results and recommendation.  Patient verbalizes understanding and is agreeable to plan of care. Advised patient to call back with any issues or concerns.  

## 2020-02-29 ENCOUNTER — Ambulatory Visit (INDEPENDENT_AMBULATORY_CARE_PROVIDER_SITE_OTHER): Payer: Medicare PPO | Admitting: *Deleted

## 2020-02-29 ENCOUNTER — Other Ambulatory Visit: Payer: Self-pay

## 2020-02-29 DIAGNOSIS — B351 Tinea unguium: Secondary | ICD-10-CM

## 2020-02-29 NOTE — Progress Notes (Signed)
Patient presents today for the 6th laser treatment. Diagnosed with mycotic nail infection by Dr. Cannon Kettle.   Toenail most affected the hallux right, medial border. Her nail has been very slow to grow. She says she has been using the topical antifungal twice a day. The nail and skin looks very macerated.  All other systems are negative.  Nails were filed thin. Laser therapy was administered to 1st toenails right and patient tolerated the treatment well. All safety precautions were in place.   Patient is using the topical antifungal, but advised she maybe should only use it once a week due to the irritation of her skin and nail.  This would typically be the last treatment, however, her nail is still not where we would like it to be. I recommended 3 more treatments at 8 week intervals to give the nail some time to grow out. Follow up with her in 8 weeks for laser #7.  ~ 3 more treatments at 8 week intervals ~

## 2020-04-18 ENCOUNTER — Other Ambulatory Visit: Payer: Self-pay

## 2020-04-18 ENCOUNTER — Ambulatory Visit (INDEPENDENT_AMBULATORY_CARE_PROVIDER_SITE_OTHER): Payer: Medicare PPO | Admitting: *Deleted

## 2020-04-18 DIAGNOSIS — B351 Tinea unguium: Secondary | ICD-10-CM

## 2020-04-18 NOTE — Progress Notes (Signed)
Patient presents today for the 7th laser treatment. Diagnosed with mycotic nail infection by Dr. Cannon Kettle.   Toenail most affected the hallux right, medial border. Her nail is looking much better today and has grown out quite a bit.   All other systems are negative.  Nails were filed thin. Laser therapy was administered to 1st toenails right and patient tolerated the treatment well. All safety precautions were in place.   Patient is using the topical antifungal, but advised she maybe should only use it once a week due to the irritation of her skin and nail.  Follow up in 8 weeks for Laser #8.    ~ 3 more treatments at 8 week intervals (9 treatments total)

## 2020-05-05 NOTE — Progress Notes (Signed)
69 y.o. [id number] Married White or Caucasian female here for annual exam.    Has only one concern, regarding vaginal dryness. Not interested in estrogen but wonders if there is something that can be used daily.  Taking vit D daily for history of vitamin D deficiency, but wonders if she is taking too much. Other lab work is done through PCP, sees cardiology once per year for MVP.  Denies vaginal bleeding    Patient's last menstrual period was 06/15/2003.          Sexually active: Yes.    The current method of family planning is tubal ligation.    Exercising: Yes.    walking Smoker:  no  Health Maintenance: Pap:  03-03-16 neg HPV HR neg, 05-07-2019 neg History of abnormal Pap:  no MMG:  Waiting for fax Colonoscopy:  2013 neg f/u 4yrs BMD:   08-13-2019 f/u 25yrs TDaP:  2011 Gardasil: n/a Covid-19: pfizer  Pneumonia vaccine(s):  2019 Shingrix:   Had both per patient Hep C testing: neg 2017    reports that she has never smoked. She has never used smokeless tobacco. She reports current alcohol use of about 4.0 standard drinks of alcohol per week. She reports that she does not use drugs.  Past Medical History:  Diagnosis Date  . Depression 12/01/2017  . Family history of long QT syndrome 12/01/2017  . Hyperlipidemia 12/01/2017  . Left atrial enlargement 12/27/2014  . Mitral valve prolapse 12/01/2017  . MVP (mitral valve prolapse)   . Osteopenia   . Palpitation 12/26/2014  . Pectus excavatum 12/01/2017  . Thoracic scoliosis 12/01/2017  . Wrist fracture, right 07/2013   slipped on ice    Past Surgical History:  Procedure Laterality Date  . [place] SURGERY  2007  . L5 discectomy    . TUBAL LIGATION  1993    Current Outpatient Medications  Medication Sig Dispense Refill  . Biotin 1 MG CAPS Take by mouth daily.    . Calcium Carb-Cholecalciferol (CALCIUM 600 + D PO) Take 1 tablet by mouth daily.    . Coenzyme Q10 (CO Q 10 PO) Take by mouth daily.    Marland Kitchen loratadine (CLARITIN) 10  MG tablet Take 10 mg by mouth daily.    . naproxen sodium (ALEVE) 220 MG tablet Take by mouth as needed.    . Omega-3 Fatty Acids (FISH OIL PO) Take 1 tablet by mouth daily.     . Probiotic Product (FORTIFY PROBIOTIC WOMENS EX ST PO)     . UNABLE TO FIND Hydro eye    . VITAMIN D PO Take 5,000 Int'l Units by mouth.     No current facility-administered medications for this visit.    Family History  Problem Relation Age of Onset  . Hypertension Mother   . Mitral valve prolapse Mother   . Hypertension Father   . Alzheimer's disease Father   . Diabetes Maternal Grandmother   . Diabetes Paternal Grandfather   . Breast cancer Maternal Aunt     Review of Systems  Constitutional: Negative.   HENT: Negative.   Eyes: Negative.   Respiratory: Negative.   Cardiovascular: Negative.   Gastrointestinal: Negative.   Endocrine: Negative.   Genitourinary: Negative.        Vaginal dryness  Musculoskeletal: Negative.   Skin: Negative.   Allergic/Immunologic: Negative.   Neurological: Negative.   Hematological: Negative.   Psychiatric/Behavioral: Negative.     Exam:   BP 108/64   Pulse 68   Resp  16   Ht 5' 7.75" (1.721 m)   Wt 145 lb (65.8 kg)   LMP 06/15/2003   BMI 22.21 kg/m   Height: 5' 7.75" (172.1 cm)  General appearance: alert, cooperative and appears stated age Head: Normocephalic, without obvious abnormality, atraumatic Neck: no adenopathy, supple, symmetrical, trachea midline and thyroid normal to inspection and palpation Lungs: clear to auscultation bilaterally Breasts: normal appearance, no masses or tenderness, pectus excavatum, making bony structures prominent Heart: regular rate and rhythm Abdomen: soft, non-tender; bowel sounds normal; no masses,  no organomegaly Extremities: extremities normal, atraumatic, no cyanosis or edema Skin: Skin color, texture, turgor normal. No rashes or lesions Lymph nodes: Cervical, supraclavicular, and axillary nodes normal. No  abnormal inguinal nodes palpated Neurologic: Grossly normal   Pelvic: External genitalia:  no lesions              Urethra:  No lesions, gaping urethra              Bartholins and Skenes: normal                 Vagina: normal appearing vagina, appropriate for age with atrophy noted, pale color and minimal discharge, no lesions              Cervix: no cervical motion tenderness and no lesions              Pap taken: No. Bimanual Exam:  Uterus:  normal size, contour, position, consistency, mobility, non-tender              Adnexa: no mass, fullness, tenderness               Rectovaginal: Confirms               Anus:  normal sphincter tone, no lesions  Larene Beach, CMA Chaperone was present for exam.  A:  Well Woman with normal exam  Osteopenia  Hx vitamin D deficiency, but taking 5000 iu daily  P:   Mammogram last done 09/2019, stated normal. Will try to obtain fax [facility]  pap smear due 2023  Dexa due next year, recommend calcium 1200-1500 mg calcium. Will drawn Vit D level to see if supplementation is too high.  Discussed use of Replens vaginal moisturizer return annually or prn

## 2020-05-12 ENCOUNTER — Ambulatory Visit (INDEPENDENT_AMBULATORY_CARE_PROVIDER_SITE_OTHER): Payer: Medicare PPO | Admitting: Nurse Practitioner

## 2020-05-12 ENCOUNTER — Ambulatory Visit: Payer: Medicare Other | Admitting: Certified Nurse Midwife

## 2020-05-12 ENCOUNTER — Encounter: Payer: Self-pay | Admitting: Nurse Practitioner

## 2020-05-12 ENCOUNTER — Other Ambulatory Visit: Payer: Self-pay

## 2020-05-12 VITALS — BP 108/64 | HR 68 | Resp 16 | Ht 67.75 in | Wt 145.0 lb

## 2020-05-12 DIAGNOSIS — Z01419 Encounter for gynecological examination (general) (routine) without abnormal findings: Secondary | ICD-10-CM

## 2020-05-12 DIAGNOSIS — Z8639 Personal history of other endocrine, nutritional and metabolic disease: Secondary | ICD-10-CM

## 2020-05-12 NOTE — Patient Instructions (Signed)
May try Replens vaginal moisturizer  Consider taking 1200-1500 mg calcium, we will check Vitamin D level to see if 5000 IU is too much.   Health Maintenance for Postmenopausal Women Menopause is a normal process in which your ability to get pregnant comes to an end. This process happens slowly over many months or years, usually between the ages of 36 and 8. Menopause is complete when you have missed your menstrual periods for 12 months. It is important to talk with your health care provider about some of the most common conditions that affect women after menopause (postmenopausal women). These include heart disease, cancer, and bone loss (osteoporosis). Adopting a healthy lifestyle and getting preventive care can help to promote your health and wellness. The actions you take can also lower your chances of developing some of these common conditions. What should I know about menopause? During menopause, you may get a number of symptoms, such as:  Hot flashes. These can be moderate or severe.  Night sweats.  Decrease in sex drive.  Mood swings.  Headaches.  Tiredness.  Irritability.  Memory problems.  Insomnia. Choosing to treat or not to treat these symptoms is a decision that you make with your health care provider. Do I need hormone replacement therapy?  Hormone replacement therapy is effective in treating symptoms that are caused by menopause, such as hot flashes and night sweats.  Hormone replacement carries certain risks, especially as you become older. If you are thinking about using estrogen or estrogen with progestin, discuss the benefits and risks with your health care provider. What is my risk for heart disease and stroke? The risk of heart disease, heart attack, and stroke increases as you age. One of the causes may be a change in the body's hormones during menopause. This can affect how your body uses dietary fats, triglycerides, and cholesterol. Heart attack and stroke  are medical emergencies. There are many things that you can do to help prevent heart disease and stroke. Watch your blood pressure  High blood pressure causes heart disease and increases the risk of stroke. This is more likely to develop in people who have high blood pressure readings, are of African descent, or are overweight.  Have your blood pressure checked: ? Every 3-5 years if you are 52-61 years of age. ? Every year if you are 59 years old or older. Eat a healthy diet   Eat a diet that includes plenty of vegetables, fruits, low-fat dairy products, and lean protein.  Do not eat a lot of foods that are high in solid fats, added sugars, or sodium. Get regular exercise Get regular exercise. This is one of the most important things you can do for your health. Most adults should:  Try to exercise for at least 150 minutes each week. The exercise should increase your heart rate and make you sweat (moderate-intensity exercise).  Try to do strengthening exercises at least twice each week. Do these in addition to the moderate-intensity exercise.  Spend less time sitting. Even light physical activity can be beneficial. Other tips  Work with your health care provider to achieve or maintain a healthy weight.  Do not use any products that contain nicotine or tobacco, such as cigarettes, e-cigarettes, and chewing tobacco. If you need help quitting, ask your health care provider.  Know your numbers. Ask your health care provider to check your cholesterol and your blood sugar (glucose). Continue to have your blood tested as directed by your health care provider. Do  I need screening for cancer? Depending on your health history and family history, you may need to have cancer screening at different stages of your life. This may include screening for:  Breast cancer.  Cervical cancer.  Lung cancer.  Colorectal cancer. What is my risk for osteoporosis? After menopause, you may be at increased  risk for osteoporosis. Osteoporosis is a condition in which bone destruction happens more quickly than new bone creation. To help prevent osteoporosis or the bone fractures that can happen because of osteoporosis, you may take the following actions:  If you are 51-36 years old, get at least 1,000 mg of calcium and at least 600 mg of vitamin D per day.  If you are older than age 64 but younger than age 70, get at least 1,200 mg of calcium and at least 600 mg of vitamin D per day.  If you are older than age 74, get at least 1,200 mg of calcium and at least 800 mg of vitamin D per day. Smoking and drinking excessive alcohol increase the risk of osteoporosis. Eat foods that are rich in calcium and vitamin D, and do weight-bearing exercises several times each week as directed by your health care provider. How does menopause affect my mental health? Depression may occur at any age, but it is more common as you become older. Common symptoms of depression include:  Low or sad mood.  Changes in sleep patterns.  Changes in appetite or eating patterns.  Feeling an overall lack of motivation or enjoyment of activities that you previously enjoyed.  Frequent crying spells. Talk with your health care provider if you think that you are experiencing depression. General instructions See your health care provider for regular wellness exams and vaccines. This may include:  Scheduling regular health, dental, and eye exams.  Getting and maintaining your vaccines. These include: ? Influenza vaccine. Get this vaccine each year before the flu season begins. ? Pneumonia vaccine. ? Shingles vaccine. ? Tetanus, diphtheria, and pertussis (Tdap) booster vaccine. Your health care provider may also recommend other immunizations. Tell your health care provider if you have ever been abused or do not feel safe at home. Summary  Menopause is a normal process in which your ability to get pregnant comes to an  end.  This condition causes hot flashes, night sweats, decreased interest in sex, mood swings, headaches, or lack of sleep.  Treatment for this condition may include hormone replacement therapy.  Take actions to keep yourself healthy, including exercising regularly, eating a healthy diet, watching your weight, and checking your blood pressure and blood sugar levels.  Get screened for cancer and depression. Make sure that you are up to date with all your vaccines. This information is not intended to replace advice given to you by your health care provider. Make sure you discuss any questions you have with your health care provider. Document Revised: 05/24/2018 Document Reviewed: 05/24/2018 Elsevier Patient Education  2020 [employer].

## 2020-05-13 LAB — VITAMIN D 25 HYDROXY (VIT D DEFICIENCY, FRACTURES): Vit D, 25-Hydroxy: 39.4 ng/mL (ref 30.0–100.0)

## 2020-06-23 ENCOUNTER — Ambulatory Visit (INDEPENDENT_AMBULATORY_CARE_PROVIDER_SITE_OTHER): Payer: Medicare PPO | Admitting: *Deleted

## 2020-06-23 ENCOUNTER — Other Ambulatory Visit: Payer: Self-pay

## 2020-06-23 DIAGNOSIS — B351 Tinea unguium: Secondary | ICD-10-CM

## 2020-06-23 NOTE — Progress Notes (Signed)
Patient presents today for the 8th laser treatment. Diagnosed with mycotic nail infection by Dr. Cannon Kettle.   Toenail most affected the hallux right, medial border. Her nail is looking much better, just really slow growing.  All other systems are negative.  Nails were filed thin. Laser therapy was administered to 1st toenails right and patient tolerated the treatment well. All safety precautions were in place.   Patient is using the topical antifungal, but advised she maybe should only use it once a week due to the irritation of her skin and nail.  Follow up in 8 weeks for Laser #9  ~Take pic next visit~    ~ 3 consecutive treatments at 8 week intervals (9 treatments total)

## 2020-06-27 ENCOUNTER — Other Ambulatory Visit: Payer: Medicare PPO

## 2020-08-22 ENCOUNTER — Other Ambulatory Visit: Payer: Self-pay

## 2020-08-22 ENCOUNTER — Ambulatory Visit (INDEPENDENT_AMBULATORY_CARE_PROVIDER_SITE_OTHER): Payer: Medicare PPO

## 2020-08-22 DIAGNOSIS — B351 Tinea unguium: Secondary | ICD-10-CM

## 2020-08-22 NOTE — Progress Notes (Signed)
Patient presents today for the 9th laser treatment. Diagnosed with mycotic nail infection by Dr. Cannon Kettle.   Toenail most affected the hallux right, medial border. Her nail is looking much better, just really slow growing.  All other systems are negative.  Nails were filed thin. Laser therapy was administered to 1st toenails right and patient tolerated the treatment well. All safety precautions were in place.   Patient is using the topical antifungal, but advised she maybe should only use it once a week due to the irritation of her skin and nail.  Patient has completed the recommended laser treatments. He will follow up with Dr. Cannon Kettle in 3 months to evaluate progress.

## 2020-11-17 ENCOUNTER — Telehealth: Payer: Self-pay | Admitting: Cardiology

## 2020-11-17 ENCOUNTER — Ambulatory Visit: Payer: Medicare PPO

## 2020-11-17 DIAGNOSIS — R55 Syncope and collapse: Secondary | ICD-10-CM

## 2020-11-17 NOTE — Telephone Encounter (Signed)
Left vm to callback to schedule a monitor appt.

## 2020-11-17 NOTE — Telephone Encounter (Signed)
Pt will come 6/6 for monitor appt.

## 2020-11-17 NOTE — Telephone Encounter (Signed)
Pt states that over a week ago while on vacation she had climbed approximately 50 steps when her heart was pounding and she became short of breath and had a syncopal episode times 2. Pt states that she is in shape and normally walks 3-6 miles daily. Pt states her next appointment is not until 01/12/21 and wondered if she needs to wear a monitor. Pt denies cp at the time of either episode. How do you advise?

## 2020-11-17 NOTE — Addendum Note (Signed)
Addended by: Truddie Hidden on: 11/17/2020 02:16 PM   Modules accepted: Orders

## 2020-11-17 NOTE — Telephone Encounter (Signed)
patient called in to say that she passed out while on vacation. patients stated that she hit her face now swollen face, black out and couldn't catch her breath. patient is seeing if the dr want to put a monitor on her before her aug appt? Please advise

## 2020-11-18 ENCOUNTER — Ambulatory Visit (INDEPENDENT_AMBULATORY_CARE_PROVIDER_SITE_OTHER): Payer: Medicare PPO

## 2020-11-18 ENCOUNTER — Other Ambulatory Visit: Payer: Self-pay | Admitting: Cardiology

## 2020-11-18 ENCOUNTER — Other Ambulatory Visit: Payer: Self-pay

## 2020-11-18 DIAGNOSIS — R55 Syncope and collapse: Secondary | ICD-10-CM

## 2020-11-25 ENCOUNTER — Ambulatory Visit: Payer: Medicare PPO | Admitting: Sports Medicine

## 2020-11-27 ENCOUNTER — Telehealth: Payer: Self-pay | Admitting: Cardiology

## 2020-11-27 NOTE — Telephone Encounter (Signed)
Dr. Dorthula Nettles called to speak with Dr. Bettina Gavia about a peer to peer review. Case number: [id number], Phone: [phone]

## 2020-11-28 NOTE — Telephone Encounter (Signed)
Spoke to them just now and gave them your information. They will be calling you today.  The providers phone number is [phone].

## 2020-12-02 ENCOUNTER — Encounter: Payer: Self-pay | Admitting: Sports Medicine

## 2020-12-02 ENCOUNTER — Other Ambulatory Visit: Payer: Self-pay

## 2020-12-02 ENCOUNTER — Ambulatory Visit: Payer: Medicare PPO | Admitting: Sports Medicine

## 2020-12-02 DIAGNOSIS — B351 Tinea unguium: Secondary | ICD-10-CM | POA: Diagnosis not present

## 2020-12-02 NOTE — Progress Notes (Signed)
Subjective:  Kelli Saunders is a 70 y.o. female patient who returns to office for follow-up evaluation of nail fungus at the right great toe.  Patient reports that she has completed all of her laser treatments as of March of this year reports that the toe is looking about the same.  Patient reports that she also ran out of her topical antifungal medication denies any constitutional symptoms at this time.  No other issues noted.   Patient Active Problem List   Diagnosis Date Noted   Mitral valve prolapse 12/01/2017   Depression 12/01/2017   Hyperlipidemia 12/01/2017   Family history of long QT syndrome 12/01/2017   Osteopenia 12/01/2017   Pectus excavatum 12/01/2017   Thoracic scoliosis 12/01/2017   Left atrial enlargement 12/27/2014   Palpitation 12/26/2014    Current Outpatient Medications on File Prior to Visit  Medication Sig Dispense Refill   Biotin 1 MG CAPS Take by mouth daily.     Calcium Carb-Cholecalciferol (CALCIUM 600 + D PO) Take 1 tablet by mouth daily.     Coenzyme Q10 (CO Q 10 PO) Take by mouth daily.     loratadine (CLARITIN) 10 MG tablet Take 10 mg by mouth daily.     naproxen sodium (ALEVE) 220 MG tablet Take by mouth as needed.     Omega-3 Fatty Acids (FISH OIL PO) Take 1 tablet by mouth daily.      Probiotic Product (FORTIFY PROBIOTIC WOMENS EX ST PO)      UNABLE TO FIND Hydro eye     VITAMIN D PO Take 5,000 Int'l Units by mouth.     No current facility-administered medications on file prior to visit.    Allergies  Allergen Reactions   Sulfa Antibiotics Itching    Objective:  General: Alert and oriented x3 in no acute distress  Dermatology: No open lesions bilateral lower extremities, no webspace macerations, no ecchymosis bilateral, all nails x 10 are short and thick has completed laser treatment with right 1st toenail most involved with proximal clearance noted.  But there is still distal lifting and discoloration and a small section of the nail  encompassing about 30% of the nail.  Vascular: Dorsalis Pedis and Posterior Tibial pedal pulses palpable, Capillary Fill Time 3 seconds,(+) pedal hair growth bilateral, no edema bilateral lower extremities, Temperature gradient within normal limits.  Neurology: Gross sensation intact via light touch bilateral.   Musculoskeletal: No tenderness to palpation bilateral.  Bunion deformity noted bilateral.  Strength within normal limits in all groups bilateral.    Assessment and Plan: Problem List Items Addressed This Visit   None Visit Diagnoses     Nail fungus    -  Primary       -Complete examination performed -Discussed treatment options for nail fungus -Mechanically debrided right great toenail with sterile nail nipper without incident -Refilled topical from [place] to use as instructed -Advised patient if her nail continues to grow this way may benefit from a nail avulsion procedure however elects to continue conservative care -Patient to return to office in 4 months for nail check or sooner if condition worsens.  Landis Martins, DPM

## 2020-12-03 ENCOUNTER — Telehealth: Payer: Self-pay | Admitting: Cardiology

## 2020-12-03 NOTE — Telephone Encounter (Signed)
Left message on patients voicemail letting her know that we should have all the data we need with this 14 day Live zio. I gave her our call back number to call back with any questions or concerns.

## 2020-12-03 NOTE — Telephone Encounter (Signed)
    Pt is calling to let Dr. Bettina Gavia knows that she just sent her heart monitor yesterday, she wanted to check in when she will get the 2nd heart monitor to get a reading for another 2 weeks since Dr. Bettina Gavia wants her to wear it for 30 days

## 2020-12-08 NOTE — Telephone Encounter (Signed)
Spoke to the patient just now and let her know Dr. Joya Gaskins recommendations.

## 2020-12-11 ENCOUNTER — Telehealth: Payer: Self-pay | Admitting: Cardiology

## 2020-12-11 ENCOUNTER — Telehealth: Payer: Self-pay

## 2020-12-11 MED ORDER — METOPROLOL SUCCINATE ER 25 MG PO TB24: 12.5000 mg | ORAL_TABLET | Freq: Every day | ORAL | 3 refills | Status: DC

## 2020-12-11 NOTE — Telephone Encounter (Signed)
Spoke with patient regarding results and recommendation.  Patient verbalizes understanding and is agreeable to plan of care. Advised patient to call back with any issues or concerns.  

## 2020-12-11 NOTE — Telephone Encounter (Signed)
-----   Message from Richardo Priest, MD sent at 12/11/2020  7:58 AM EDT ----- Although the frequency of extra beats is very low she is having short runs of extra beats and her events are associated with them. I think she would benefit from a very low-dose of a beta-blocker she would need to trend her heart rate and blood pressure at home recording them and start with Toprol-XL 12 and half milligrams to be taken in the evening.  If she wants she can wait till office appointment to discuss with me.

## 2020-12-11 NOTE — Telephone Encounter (Signed)
Follow up:     Patient returning a call back concering results.

## 2020-12-11 NOTE — Telephone Encounter (Signed)
Left message on patients voicemail to please return our call.   

## 2020-12-11 NOTE — Telephone Encounter (Signed)
Pt return called  to provided a number for the appeal for the Peer to Peer for appeal   Dr appeal number : [phone] Or  [phone]-   Pt stated if neither one of these numbers work , she can bring by the denial statement

## 2020-12-17 DIAGNOSIS — M25552 Pain in left hip: Secondary | ICD-10-CM | POA: Diagnosis not present

## 2020-12-17 DIAGNOSIS — M5459 Other low back pain: Secondary | ICD-10-CM | POA: Diagnosis not present

## 2020-12-17 DIAGNOSIS — M6281 Muscle weakness (generalized): Secondary | ICD-10-CM | POA: Diagnosis not present

## 2020-12-18 DIAGNOSIS — R921 Mammographic calcification found on diagnostic imaging of breast: Secondary | ICD-10-CM | POA: Diagnosis not present

## 2020-12-23 DIAGNOSIS — M6281 Muscle weakness (generalized): Secondary | ICD-10-CM | POA: Diagnosis not present

## 2020-12-23 DIAGNOSIS — M5459 Other low back pain: Secondary | ICD-10-CM | POA: Diagnosis not present

## 2020-12-23 DIAGNOSIS — M25552 Pain in left hip: Secondary | ICD-10-CM | POA: Diagnosis not present

## 2020-12-30 DIAGNOSIS — D0512 Intraductal carcinoma in situ of left breast: Secondary | ICD-10-CM | POA: Diagnosis not present

## 2021-01-01 ENCOUNTER — Telehealth: Payer: Self-pay | Admitting: Hematology

## 2021-01-01 ENCOUNTER — Encounter: Payer: Self-pay | Admitting: Family Medicine

## 2021-01-01 NOTE — Telephone Encounter (Signed)
Spoke to patient to confirm afternoon clinic for 7/27, [facility] will send packet

## 2021-01-05 ENCOUNTER — Encounter: Payer: Self-pay | Admitting: *Deleted

## 2021-01-05 DIAGNOSIS — D0512 Intraductal carcinoma in situ of left breast: Secondary | ICD-10-CM

## 2021-01-05 HISTORY — DX: Intraductal carcinoma in situ of left breast: D05.12

## 2021-01-07 ENCOUNTER — Encounter: Payer: Self-pay | Admitting: Hematology

## 2021-01-07 ENCOUNTER — Other Ambulatory Visit: Payer: Self-pay

## 2021-01-07 ENCOUNTER — Other Ambulatory Visit: Payer: Self-pay | Admitting: *Deleted

## 2021-01-07 ENCOUNTER — Inpatient Hospital Stay: Payer: Medicare PPO

## 2021-01-07 ENCOUNTER — Encounter: Payer: Self-pay | Admitting: *Deleted

## 2021-01-07 ENCOUNTER — Ambulatory Visit: Payer: Medicare PPO | Admitting: Physical Therapy

## 2021-01-07 ENCOUNTER — Inpatient Hospital Stay: Payer: Medicare PPO | Attending: Hematology | Admitting: Hematology

## 2021-01-07 ENCOUNTER — Encounter: Payer: Self-pay | Admitting: General Practice

## 2021-01-07 ENCOUNTER — Ambulatory Visit
Admission: RE | Admit: 2021-01-07 | Discharge: 2021-01-07 | Disposition: A | Discharge status: Home / Self Care | Payer: Medicare PPO | Source: Ambulatory Visit | Attending: Radiation Oncology | Admitting: Radiation Oncology

## 2021-01-07 VITALS — BP 114/72 | HR 67 | Temp 97.7°F | Resp 18 | Ht 68.5 in | Wt 142.9 lb

## 2021-01-07 DIAGNOSIS — Z803 Family history of malignant neoplasm of breast: Secondary | ICD-10-CM | POA: Diagnosis not present

## 2021-01-07 DIAGNOSIS — M8580 Other specified disorders of bone density and structure, unspecified site: Secondary | ICD-10-CM | POA: Diagnosis not present

## 2021-01-07 DIAGNOSIS — Z170 Estrogen receptor positive status [ER+]: Secondary | ICD-10-CM | POA: Insufficient documentation

## 2021-01-07 DIAGNOSIS — D0512 Intraductal carcinoma in situ of left breast: Secondary | ICD-10-CM

## 2021-01-07 DIAGNOSIS — I471 Supraventricular tachycardia: Secondary | ICD-10-CM | POA: Diagnosis not present

## 2021-01-07 DIAGNOSIS — I341 Nonrheumatic mitral (valve) prolapse: Secondary | ICD-10-CM | POA: Diagnosis not present

## 2021-01-07 DIAGNOSIS — Z79899 Other long term (current) drug therapy: Secondary | ICD-10-CM | POA: Diagnosis not present

## 2021-01-07 DIAGNOSIS — E785 Hyperlipidemia, unspecified: Secondary | ICD-10-CM | POA: Diagnosis not present

## 2021-01-07 LAB — CBC WITH DIFFERENTIAL (CANCER CENTER ONLY)
Abs Immature Granulocytes: 0.01 10*3/uL (ref 0.00–0.07)
Basophils Absolute: 0.1 10*3/uL (ref 0.0–0.1)
Basophils Relative: 1 %
Eosinophils Absolute: 0.2 10*3/uL (ref 0.0–0.5)
Eosinophils Relative: 3 %
HCT: 43.6 % (ref 36.0–46.0)
Hemoglobin: 14.8 g/dL (ref 12.0–15.0)
Immature Granulocytes: 0 %
Lymphocytes Relative: 31 %
Lymphs Abs: 2 10*3/uL (ref 0.7–4.0)
MCH: 30.9 pg (ref 26.0–34.0)
MCHC: 33.9 g/dL (ref 30.0–36.0)
MCV: 91 fL (ref 80.0–100.0)
Monocytes Absolute: 0.6 10*3/uL (ref 0.1–1.0)
Monocytes Relative: 10 %
Neutro Abs: 3.5 10*3/uL (ref 1.7–7.7)
Neutrophils Relative %: 55 %
Platelet Count: 216 10*3/uL (ref 150–400)
RBC: 4.79 MIL/uL (ref 3.87–5.11)
RDW: 12.5 % (ref 11.5–15.5)
WBC Count: 6.4 10*3/uL (ref 4.0–10.5)
nRBC: 0 % (ref 0.0–0.2)

## 2021-01-07 LAB — CMP (CANCER CENTER ONLY)
ALT: 19 U/L (ref 0–44)
AST: 29 U/L (ref 15–41)
Albumin: 4.4 g/dL (ref 3.5–5.0)
Alkaline Phosphatase: 72 U/L (ref 38–126)
Anion gap: 10 (ref 5–15)
BUN: 13 mg/dL (ref 8–23)
CO2: 26 mmol/L (ref 22–32)
Calcium: 9.8 mg/dL (ref 8.9–10.3)
Chloride: 105 mmol/L (ref 98–111)
Creatinine: 0.84 mg/dL (ref 0.44–1.00)
GFR, Estimated: >60 mL/min (ref >60)
Glucose, Bld: 91 mg/dL (ref 70–99)
Potassium: 4 mmol/L (ref 3.5–5.1)
Sodium: 141 mmol/L (ref 135–145)
Total Bilirubin: 1.1 mg/dL (ref 0.3–1.2)
Total Protein: 7.1 g/dL (ref 6.5–8.1)

## 2021-01-07 LAB — GENETIC SCREENING ORDER

## 2021-01-07 NOTE — Progress Notes (Signed)
Radiation Oncology         [phone] ________________________________  Multidisciplinary Breast Oncology Clinic [facility]) Initial Outpatient Consultation  Name: Kelli Saunders MRN: [id number]  Date: 01/07/2021  DOB: [birth year]  CC:Marco Collie, MD  Jovita Kussmaul, MD   REFERRING PHYSICIAN: Autumn Messing III, MD  DIAGNOSIS: The encounter diagnosis was Ductal carcinoma in situ (DCIS) of left breast.  Stage 0 ( cTis (DCIS), cN0, cM0), Left Breast retroareolar area , High grade ductal carcinoma in situ, ER+ / PR+     ICD-10-CM   1. Ductal carcinoma in situ (DCIS) of left breast  D05.12       HISTORY OF PRESENT ILLNESS::Kelli Saunders is a 70 y.o. female who is presenting to the office today for evaluation of her newly diagnosed breast cancer. She is accompanied by her husband . She is doing well overall.   She had routine screening mammography on 12/03/20 showing a possible calcification in the left breast. She underwent left breast diagnostic mammography at [facility] on 12/18/20 showing: a new 2.2 cm cluster of linear fine pleomorphic calcifications in the left breat; suspicious for malignancy.   Biopsy on 12/30/20 showed: high grade ductal carcinoma in situ with calcifications . Prognostic indicators significant for: estrogen receptor, >95% positive and progesterone receptor, >95% positive, both with strong staining intensity.   Menarche: 70 years old Age at first live birth: 70 years old GP: 2 LMP: 2006 Contraceptive: none HRT: none   The patient was referred today for presentation in the multidisciplinary conference.  Radiology studies and pathology slides were presented there for review and discussion of treatment options.  A consensus was discussed regarding potential next steps.  PREVIOUS RADIATION THERAPY: No  PAST MEDICAL HISTORY:  Past Medical History:  Diagnosis Date   Allergy 10/15/1958   Breast cancer ([place]) 12/30/2020   Family history of long QT syndrome  12/01/2017   Heart murmur 1981   Hyperlipidemia 12/01/2017   Left atrial enlargement 12/27/2014   Mitral valve prolapse 12/01/2017   MVP (mitral valve prolapse)    Osteopenia    Palpitation 12/26/2014   Pectus excavatum 12/01/2017   Thoracic scoliosis 12/01/2017   Wrist fracture, right 07/2013   slipped on ice    PAST SURGICAL HISTORY: Past Surgical History:  Procedure Laterality Date   CERVICAL DISC SURGERY  2007   L5 discectomy     SPINE SURGERY  2007   TUBAL LIGATION  1993    FAMILY HISTORY:  Family History  Problem Relation Age of Onset   Hypertension Mother    Mitral valve prolapse Mother    Arthritis Mother    Varicose Veins Mother    Hypertension Father    Alzheimer's disease Father    Diabetes Maternal Grandmother    Diabetes Paternal Grandfather    Breast cancer Maternal Aunt 53    SOCIAL HISTORY:  Social History   Socioeconomic History   Marital status: Married    Spouse name: Not on file   Number of children: 2   Years of education: Not on file   Highest education level: Not on file  Occupational History   Not on file  Tobacco Use   Smoking status: Never   Smokeless tobacco: Never  Vaping Use   Vaping Use: Never used  Substance and Sexual Activity   Alcohol use: Yes    Alcohol/week: 4.0 standard drinks    Types: 4 Glasses of wine per week   Drug use: No   Sexual activity: Yes  Partners: Male    Birth control/protection: Surgical    Comment: BTL  Other Topics Concern   Not on file  Social History Narrative   Not on file   Social Determinants of Health   Financial Resource Strain: Not on file  Food Insecurity: Not on file  Transportation Needs: Not on file  Physical Activity: Not on file  Stress: Not on file  Social Connections: Not on file    ALLERGIES:  Allergies  Allergen Reactions   Sulfa Antibiotics Itching    MEDICATIONS:  Current Outpatient Medications  Medication Sig Dispense Refill   Biotin 1 MG CAPS Take by  mouth daily.     Calcium Carb-Cholecalciferol (CALCIUM 600 + D PO) Take 1 tablet by mouth daily.     Coenzyme Q10 (CO Q 10 PO) Take by mouth daily.     loratadine (CLARITIN) 10 MG tablet Take 10 mg by mouth daily.     metoprolol succinate (TOPROL XL) 25 MG 24 hr tablet Take 0.5 tablets (12.5 mg total) by mouth daily. 45 tablet 3   naproxen sodium (ALEVE) 220 MG tablet Take by mouth as needed.     Omega-3 Fatty Acids (FISH OIL PO) Take 1 tablet by mouth daily.      Probiotic Product (FORTIFY PROBIOTIC WOMENS EX ST PO)      UNABLE TO FIND Hydro eye     VITAMIN D PO Take 5,000 Int'l Units by mouth.     No current facility-administered medications for this encounter.    REVIEW OF SYSTEMS: A 10+ POINT REVIEW OF SYSTEMS WAS OBTAINED including neurology, dermatology, psychiatry, cardiac, respiratory, lymph, extremities, GI, GU, musculoskeletal, constitutional, reproductive, HEENT. On the provided form, she reports heart palpitations, a fainting episode on 05/28, and steroid use (2 rounds of prednisone for sciatica in April 2022). She denies any other symptoms.    PHYSICAL EXAM:   Vitals with BMI 01/07/2021  Height 5' 8.5"  Weight 142 lbs 14 oz  BMI [id number]  Systolic 99991111  Diastolic 72  Pulse 67   Lungs are clear to auscultation bilaterally. Heart has regular rate and rhythm. No palpable cervical, supraclavicular, or axillary adenopathy. Abdomen soft, non-tender, normal bowel sounds. Breast: Right breast with no palpable mass, nipple discharge, or bleeding. Left breast with bruising in the 12 o'clock position immediately superior to the nipple areolar complex. No dominant mass nipple discharge or bleeding.   KPS = 100  100 - Normal; no complaints; no evidence of disease. 90   - Able to carry on normal activity; minor signs or symptoms of disease. 80   - Normal activity with effort; some signs or symptoms of disease. 34   - Cares for self; unable to carry on normal activity or to do active  work. 60   - Requires occasional assistance, but is able to care for most of his personal needs. 50   - Requires considerable assistance and frequent medical care. 29   - Disabled; requires special care and assistance. 88   - Severely disabled; hospital admission is indicated although death not imminent. 10   - Very sick; hospital admission necessary; active supportive treatment necessary. 10   - Moribund; fatal processes progressing rapidly. 0     - Dead  Karnofsky DA, Abelmann WH, Craver LS and Burchenal [facility] [phone]) The use of the nitrogen mustards in the palliative treatment of carcinoma: with particular reference to bronchogenic carcinoma Cancer 1 634-56  LABORATORY DATA:  Lab Results  Component Value Date  WBC 6.4 01/07/2021   HGB 14.8 01/07/2021   HCT 43.6 01/07/2021   MCV 91.0 01/07/2021   PLT 216 01/07/2021   Lab Results  Component Value Date   NA 141 01/07/2021   K 4.0 01/07/2021   CL 105 01/07/2021   CO2 26 01/07/2021   Lab Results  Component Value Date   ALT 19 01/07/2021   AST 29 01/07/2021   ALKPHOS 72 01/07/2021   BILITOT 1.1 01/07/2021    PULMONARY FUNCTION TEST:   Recent Review Flowsheet Data   There is no flowsheet data to display.     RADIOGRAPHY: LONG TERM MONITOR-LIVE TELEMETRY (3-14 DAYS)  Result Date: 12/11/2020 Patch Wear Time:  13 days and 19 hours (2022-06-07T11:15:24-0400 to 2022-06-21T06:50:08-398) Patient had a min HR of 42 bpm, max HR of 185 bpm, and avg HR of 70 bpm. Predominant underlying rhythm was Sinus Rhythm. 44 Supraventricular Tachycardia runs occurred, the run with the fastest interval lasting 12 beats with a max rate of 185 bpm, the longest lasting 15 beats with an avg rate of 106 bpm. Some episodes of Supraventricular Tachycardia may be possible Atrial Tachycardia with variable block. Supraventricular Tachycardia was detected within +/- 45 seconds of symptomatic patient event(s). Isolated SVEs were rare (<1.0%), SVE Couplets were rare  (<1.0%), and SVE Triplets were rare (<1.0%). Isolated VEs were rare (<1.0%, 917), VE Couplets were rare (<1.0%, 1), and VE Triplets were rare (<1.0%, 1). There were 5 triggered 3 diary events for which were associated with APCs and brief runs of atrial tachycardia Ventricular ectopy was rare. Supraventricular ectopy overall was rare however there were 44 brief runs of APCs or atrial tachycardia the longest 15 complexes rate of 106 bpm.  Supraventricular ectopy was symptomatic. There were no pauses of 3 seconds or greater or episodes of second or third-degree AV nodal block or sinus node exit block. Conclusion, although supraventricular ectopy was rare there were frequent episodes of brief runs of APCs or atrial tachycardia and the symptomatic events were associated with atrial arrhythmia.     IMPRESSION: Stage 0 ( cTis (DCIS), cN0, cM0), Left Breast retroareolar area , High grade ductal carcinoma in situ, ER+ / PR+   The patient has a lesion that is in the retroareolar area close to the nipple areolar complex. If she were to have lumpectomy, Dr. Marlou Starks may have to remove the nipple areolar complex to obtain clear margins. In light of this issue, the patient is deciding at this point whether to proceed with mastectomy or breast conserving surgery.   If She decided on breast conserving surgery, she will need an MRI to further characterize the size and location of the mass.   Otherwise, the patient would be a good candidate for breast conservation with radiotherapy to the left breast. We discussed the general course of radiation, potential side effects, and toxicities with radiation and the patient is interested in this approach.   If she decides on breast conserving surgery then she potentially could have her radiation therapy down in [place] where she lives.  I discussed with her that if she does have a mastectomy she likely will not require any radiation therapy   PLAN:  MRI? Only if the patient  decides on breast conserving surgery Surgical approach to be determined  Adjuvant radiation therapy ( if breast conserving surgery) Aromatase Inhibitor   ------------------------------------------------  Blair Promise, PhD, MD  This document serves as a record of services personally performed by Gery Pray, MD. It was created  on his behalf by Roney Mans, a trained medical scribe. The creation of this record is based on the scribe's personal observations and the provider's statements to them. This document has been checked and approved by the attending provider.

## 2021-01-07 NOTE — Progress Notes (Signed)
[place] Psychosocial Distress Screening Spiritual Care  Met with Kelli Saunders and her husband in [facility] to introduce [facility] team/resources, reviewing distress screen per protocol.  The patient scored a 6 on the Psychosocial Distress Thermometer which indicates moderate distress. Also assessed for distress and other psychosocial needs.   ONCBCN DISTRESS SCREENING 01/07/2021  Screening Type Initial Screening  Distress experienced in past week (1-10) 6  Family Problem type Other (comment)  Emotional problem type Adjusting to illness  Physical Problem type Sleep/insomnia  Referral to support programs Yes   Kelli Saunders is looking forward to their children's visiting for the month of August, especially because they will get to care for their 7yo grandson Kelli Saunders. They report good support.  Follow up needed: No. Kelli Saunders plans to reach out as needed/desired.   [place], [place], [facility] Pager [phone] Voicemail [phone]

## 2021-01-07 NOTE — Progress Notes (Addendum)
[place]   Telephone:([phone]) [phone] Fax:([phone]) [place] Note   Patient Care Team: Marco Collie, MD as PCP - General (Family Medicine) Karma Ganja, NP as Nurse Practitioner (Gynecology) Mauro Kaufmann, RN as Oncology Nurse Navigator Rockwell Germany, RN as Oncology Nurse Navigator Truitt Merle, MD as Consulting Physician (Hematology) Jovita Kussmaul, MD as Consulting Physician (General Surgery) Gery Pray, MD as Consulting Physician (Radiation Oncology) Richardo Priest, MD as Consulting Physician (Cardiology) Landis Martins, DPM as Consulting Physician (Podiatry) Sydnee Levans, MD as Referring Physician (Dermatology)  Date of Service:  01/07/2021   CHIEF COMPLAINTS/PURPOSE OF CONSULTATION:  Left Breast DCIS, ER+  REFERRING PHYSICIAN:  [facility]   Oncology History Overview Note  Cancer Staging Ductal carcinoma in situ (DCIS) of left breast Staging form: Breast, AJCC 8th Edition - Clinical: No stage assigned - Unsigned    Ductal carcinoma in situ (DCIS) of left breast  12/18/2020 Mammogram   Left Diagnostic  IMPRESSION: The new 2.2 cm cluster of linear fine pleomorphic calcifications in the left breast at 1 o'clock in the retroreolar region 1 cm from the nipple. No other significant masses, calcifications, or other findings are seen in either breast.   01/05/2021 Initial Diagnosis   Ductal carcinoma in situ (DCIS) of left breast      HISTORY OF PRESENTING ILLNESS:  Kelli Saunders 70 y.o. female is a here because of breast cancer. The patient was referred by [facility]. The patient presents to the clinic today accompanied by her husband Elwin Mocha.  She had routine screening mammography on 12/03/20 showing calcifications in the left breast. She underwent left diagnostic mammography on 12/18/20 showing: 2.2 cm calcifications in left breast at 1 o'clock.    Biopsy on 12/30/2020 showed high-grade DCIS with calcification. Prognostic  indicators significant for: estrogen receptor, more than 95% positive, PR more than 95% positive, both with strong staining.   She feels well overall, denies any pain or any other new symptoms.  Socially -She is a retired Network engineer -Two adult children -She notes a maternal aunt with breast cancer, no other family history of cancer to her knowledge.   GYN HISTORY  Menarchal: 70 years old LMP: 2006 (~age 89) Contraceptive: never used HRT: never used G4 P2    REVIEW OF SYSTEMS:    Constitutional: Denies fevers, chills or abnormal night sweats Eyes: Denies blurriness of vision, double vision or watery eyes Ears, nose, mouth, throat, and face: Denies mucositis or sore throat Respiratory: Denies cough, dyspnea or wheezes Cardiovascular: Denies palpitation, chest discomfort or lower extremity swelling Gastrointestinal:  Denies nausea, heartburn or change in bowel habits Skin: Denies abnormal skin rashes Lymphatics: Denies new lymphadenopathy or easy bruising Neurological:Denies numbness, tingling or new weaknesses Behavioral/Psych: Mood is stable, no new changes  All other systems were reviewed with the patient and are negative.   MEDICAL HISTORY:  Past Medical History:  Diagnosis Date   Allergy 10/15/1958   Breast cancer ([place]) 12/30/2020   Family history of long QT syndrome 12/01/2017   Heart murmur 1981   Hyperlipidemia 12/01/2017   Left atrial enlargement 12/27/2014   Mitral valve prolapse 12/01/2017   MVP (mitral valve prolapse)    Osteopenia    Palpitation 12/26/2014   Pectus excavatum 12/01/2017   Thoracic scoliosis 12/01/2017   Wrist fracture, right 07/2013   slipped on ice    SURGICAL HISTORY: Past Surgical History:  Procedure Laterality Date   CERVICAL DISC SURGERY  2007  L5 discectomy     SPINE SURGERY  2007   TUBAL LIGATION  1993    SOCIAL HISTORY: Social History   Socioeconomic History   Marital status: Married    Spouse name:  Not on file   Number of children: 2   Years of education: Not on file   Highest education level: Not on file  Occupational History   Not on file  Tobacco Use   Smoking status: Never   Smokeless tobacco: Never  Vaping Use   Vaping Use: Never used  Substance and Sexual Activity   Alcohol use: Yes    Alcohol/week: 4.0 standard drinks    Types: 4 Glasses of wine per week   Drug use: No   Sexual activity: Yes    Partners: Male    Birth control/protection: Surgical    Comment: BTL  Other Topics Concern   Not on file  Social History Narrative   Not on file   Social Determinants of Health   Financial Resource Strain: Not on file  Food Insecurity: Not on file  Transportation Needs: Not on file  Physical Activity: Not on file  Stress: Not on file  Social Connections: Not on file  Intimate Partner Violence: Not on file    FAMILY HISTORY: Family History  Problem Relation Age of Onset   Hypertension Mother    Mitral valve prolapse Mother    Arthritis Mother    Varicose Veins Mother    Hypertension Father    Alzheimer's disease Father    Diabetes Maternal Grandmother    Diabetes Paternal Grandfather    Breast cancer Maternal Aunt 75    ALLERGIES:  is allergic to sulfa antibiotics.  MEDICATIONS:  Current Outpatient Medications  Medication Sig Dispense Refill   Biotin 1 MG CAPS Take by mouth daily.     Calcium Carb-Cholecalciferol (CALCIUM 600 + D PO) Take 1 tablet by mouth daily.     Coenzyme Q10 (CO Q 10 PO) Take by mouth daily.     loratadine (CLARITIN) 10 MG tablet Take 10 mg by mouth daily.     metoprolol succinate (TOPROL XL) 25 MG 24 hr tablet Take 0.5 tablets (12.5 mg total) by mouth daily. 45 tablet 3   naproxen sodium (ALEVE) 220 MG tablet Take by mouth as needed.     Omega-3 Fatty Acids (FISH OIL PO) Take 1 tablet by mouth daily.      Probiotic Product (FORTIFY PROBIOTIC WOMENS EX ST PO)      UNABLE TO FIND Hydro eye     VITAMIN D PO Take 5,000 Int'l Units  by mouth.     No current facility-administered medications for this visit.    PHYSICAL EXAMINATION: ECOG PERFORMANCE STATUS: 0 - Asymptomatic  Vitals:   01/07/21 1300  BP: 114/72  Pulse: 67  Resp: 18  Temp: 97.7 F (36.5 C)  SpO2: 98%   Filed Weights   01/07/21 1300  Weight: 142 lb 14.4 oz (64.8 kg)    GENERAL:alert, no distress and comfortable SKIN: skin color, texture, turgor are normal, no rashes or significant lesions EYES: normal, Conjunctiva are pink and non-injected, sclera clear  NECK: supple, thyroid normal size, non-tender, without nodularity LYMPH:  no palpable lymphadenopathy in the cervical, axillary  LUNGS: clear to auscultation and percussion with normal breathing effort HEART: regular rate & rhythm and no murmurs and no lower extremity edema ABDOMEN:abdomen soft, non-tender and normal bowel sounds Musculoskeletal:no cyanosis of digits and no clubbing  NEURO: alert & oriented  x 3 with fluent speech, no focal motor/sensory deficits BREAST: Breast inspection showed them to be symmetrical with no nipple discharge. Palpation of the breasts and axilla revealed no obvious mass that I could appreciate.   LABORATORY DATA:  I have reviewed the data as listed CBC Latest Ref Rng & Units 01/07/2021 04/05/2017 03/03/2016  WBC 4.0 - 10.5 K/uL 6.4 5.9 6.0  Hemoglobin 12.0 - 15.0 g/dL 14.8 15.8 15.7(H)  Hematocrit 36.0 - 46.0 % 43.6 46.9(H) 47.0(H)  Platelets 150 - 400 K/uL 216 211 219    CMP Latest Ref Rng & Units 01/07/2021 12/21/2018 12/21/2017  Glucose 70 - 99 mg/dL 91 97 89  BUN 8 - 23 mg/dL '13 14 13  '$ Creatinine 0.44 - 1.00 mg/dL 0.84 0.77 0.82  Sodium 135 - 145 mmol/L 141 137 138  Potassium 3.5 - 5.1 mmol/L 4.0 4.4 4.0  Chloride 98 - 111 mmol/L 105 98 97  CO2 22 - 32 mmol/L '26 24 25  '$ Calcium 8.9 - 10.3 mg/dL 9.8 9.7 9.9  Total Protein 6.5 - 8.1 g/dL 7.1 6.7 6.8  Total Bilirubin 0.3 - 1.2 mg/dL 1.1 1.1 1.2  Alkaline Phos 38 - 126 U/L 72 66 68  AST 15 - 41 U/L '29  29 27  '$ ALT 0 - 44 U/L '19 21 17     '$ RADIOGRAPHIC STUDIES: I have personally reviewed the radiological images as listed and agreed with the findings in the report. LONG TERM MONITOR-LIVE TELEMETRY (3-14 DAYS)  Result Date: 12/11/2020 Patch Wear Time:  13 days and 19 hours (2022-06-07T11:15:24-0400 to 2022-06-21T06:50:08-398) Patient had a min HR of 42 bpm, max HR of 185 bpm, and avg HR of 70 bpm. Predominant underlying rhythm was Sinus Rhythm. 44 Supraventricular Tachycardia runs occurred, the run with the fastest interval lasting 12 beats with a max rate of 185 bpm, the longest lasting 15 beats with an avg rate of 106 bpm. Some episodes of Supraventricular Tachycardia may be possible Atrial Tachycardia with variable block. Supraventricular Tachycardia was detected within +/- 45 seconds of symptomatic patient event(s). Isolated SVEs were rare (<1.0%), SVE Couplets were rare (<1.0%), and SVE Triplets were rare (<1.0%). Isolated VEs were rare (<1.0%, 917), VE Couplets were rare (<1.0%, 1), and VE Triplets were rare (<1.0%, 1). There were 5 triggered 3 diary events for which were associated with APCs and brief runs of atrial tachycardia Ventricular ectopy was rare. Supraventricular ectopy overall was rare however there were 44 brief runs of APCs or atrial tachycardia the longest 15 complexes rate of 106 bpm.  Supraventricular ectopy was symptomatic. There were no pauses of 3 seconds or greater or episodes of second or third-degree AV nodal block or sinus node exit block. Conclusion, although supraventricular ectopy was rare there were frequent episodes of brief runs of APCs or atrial tachycardia and the symptomatic events were associated with atrial arrhythmia.   ASSESSMENT & PLAN:  Kelli Saunders is a 70 y.o. postmenopausal female with a history of   1. Left breast DCIS, grade 3, ER+/PR+ -I discussed her breast imaging and needle biopsy results with patient and her family members in great  detail. -She is a candidate for breast conservation surgery. She will also meet with breast surgeon Dr. Marlou Starks today, who recommends lumpectomy. -Her DCIS will be cured by complete surgical resection. Any form of adjuvant therapy is preventive. -I reviewed her risk and treatment benefits using the Breast Cancer Nomogram from [facility]). Based on family, PMx and lifestyle she has  15% risk of developing future breast cancer in the next 10 years. Her risk would drop to 6-7% with RT or Antiestrogen therapy alone. With both adjuvant treatments her risk would decrease to 3%.  -Given her strongly positive/negative ER and PR, I do recommend antiestrogen therapy with tamofixen or anastrozole, which decrease her risk of future breast cancer by ~40%.  Given her osteopenia, tamoxifen is a better option for her. ---The potential side effects of Tamoxifen, which includes but not limited to, hot flash, skin and vaginal dryness, slightly increased risk of cardiovascular disease and cataract, small risk of thrombosis and endometrial cancer, were discussed with her in great details. Preventive strategies for thrombosis, such as being physically active, using compression stocks, avoid cigarette smoking, etc., were reviewed with her. I also recommend her to follow-up with her gynecologist once a year, and watch for vaginal spotting or bleeding, as a clinically sign of endometrial cancer, etc. She voiced good understanding, and agrees to proceed. Will start after she completes adjuvant breast radiati -She will likely benefit from breast radiation if she undergo lumpectomy to decrease the risk of breast cancer. She will discuss this further with Dr. Sondra Come today. She is interested  -We also discussed that biopsy may have sampling limitation, we will review her surgical path, to see if she has any invasive carcinoma components. -We discussed breast cancer surveillance after she completes treatment,  Including annual mammogram and annual breast MRI, breast exam every 6-12 months.    Plan: -She will likely have lumpectomy soon -Genetic refer -she is interested in both adjuvant radiation and tamoxifen to reduce her risk of future breast cancer. -I'll see her 2-3 weeks after her surgery to review her pathology result and finalize her antiestrogen therapy.     No orders of the defined types were placed in this encounter.   All questions were answered. The patient knows to call the clinic with any problems, questions or concerns. The total time spent in the appointment was 50 minutes.     Truitt Merle, MD 01/07/2021 3:46 PM  I, Wilburn Mylar, am acting as scribe for Truitt Merle, MD.   I have reviewed the above documentation for accuracy and completeness, and I agree with the above.

## 2021-01-07 NOTE — Progress Notes (Signed)
Error

## 2021-01-09 ENCOUNTER — Telehealth: Payer: Self-pay | Admitting: *Deleted

## 2021-01-09 ENCOUNTER — Encounter: Payer: Self-pay | Admitting: *Deleted

## 2021-01-09 ENCOUNTER — Other Ambulatory Visit: Payer: Medicare PPO

## 2021-01-09 NOTE — Telephone Encounter (Signed)
Spoke with patient to follow up from [facility] 7/27 and assess navigation needs. No needs at that this time. Encouraged patient to call should anything arise. Patient verbalized understanding.

## 2021-01-11 NOTE — Progress Notes (Signed)
Cardiology Office Note:    Date:  01/12/2021   ID:  Kelli Saunders, DOB 05/10/1951, MRN [id number]  PCP:  Marco Collie, MD  Cardiologist:  Shirlee More, MD    Referring MD: Marco Collie, MD    ASSESSMENT:    1. Syncope and collapse   2. PAT (paroxysmal atrial tachycardia) ([place])   3. Mitral valve prolapse    PLAN:    In order of problems listed above:  Stable no recurrence unsure whether the episode of syncope is related to her atrial tachycardia or situational with vigorous physical activity followed by immobilization.  Regardless we will continue low-dose beta-blocker reduced to 3 days a week. She has breast cancer and will be having upcoming lumpectomy and I think she is optimized from a cardiology perspective   Next appointment: 1 year   Medication Adjustments/Labs and Tests Ordered: Current medicines are reviewed at length with the patient today.  Concerns regarding medicines are outlined above.  Orders Placed This Encounter  Procedures   EKG 12-Lead    Meds ordered this encounter  Medications   metoprolol succinate (TOPROL XL) 25 MG 24 hr tablet    Sig: Take 0.5 tablets (12.5 mg total) by mouth 3 (three) times a week.    Dispense:  20 tablet    Refill:  3     Chief Complaint  Patient presents with   Follow-up    After a syncopal episode, her event monitor showed frequent episodes of PAT     History of Present Illness:    Kelli Saunders is a 70 y.o. female with a hx of mitral valve prolapse palpitation family history of long QT syndrome last seen since 01/07/2020.  Subsequently she had a syncopal episode.  Compliance with diet, lifestyle and medications: Yes  She tolerates the beta-blocker but at times has blood pressures less than 100 pulse less than 60 is long-acting will reduce to 3 days a week. Overall is done well no palpitation or recurrent syncope Her episode in the last 2 is related to vigorous activity climbing stairs and abruptly  stopping and had a brief syncopal episode. Her event monitor showed frequent episodes of brief atrial tachycardia.  A 14-day event monitor showed rare supraventricular ectopy however she had frequent episodes of brief runs of APCs or atrial tachycardia and these were symptomatic.  She was initiated on a beta-blocker. Past Medical History:  Diagnosis Date   Allergy 10/15/1958   Breast cancer ([place]) 12/30/2020   Family history of long QT syndrome 12/01/2017   Heart murmur 1981   Hyperlipidemia 12/01/2017   Left atrial enlargement 12/27/2014   Mitral valve prolapse 12/01/2017   MVP (mitral valve prolapse)    Osteopenia    Palpitation 12/26/2014   Pectus excavatum 12/01/2017   Thoracic scoliosis 12/01/2017   Wrist fracture, right 07/2013   slipped on ice    Past Surgical History:  Procedure Laterality Date   CERVICAL DISC SURGERY  2007   L5 discectomy     SPINE SURGERY  2007   TUBAL LIGATION  1993    Current Medications: Current Meds  Medication Sig   Biotin 1 MG CAPS Take 1 mg by mouth daily.   Calcium Carb-Cholecalciferol (CALCIUM 600 + D PO) Take 1 tablet by mouth daily.   Coenzyme Q10 (CO Q 10 PO) Take 1 tablet by mouth daily.   loratadine (CLARITIN) 10 MG tablet Take 10 mg by mouth daily.   naproxen sodium (ALEVE) 220 MG tablet Take 220  mg by mouth as needed (pain).   Omega-3 Fatty Acids (FISH OIL PO) Take 1 tablet by mouth daily.    Probiotic Product (FORTIFY PROBIOTIC WOMENS EX ST PO) Take 1 tablet by mouth daily.   UNABLE TO FIND Place 1 drop into both eyes daily. Hydro eye   VITAMIN D PO Take 5,000 Int'l Units by mouth daily.   [DISCONTINUED] metoprolol succinate (TOPROL XL) 25 MG 24 hr tablet Take 0.5 tablets (12.5 mg total) by mouth daily.     Allergies:   Sulfa antibiotics   Social History   Socioeconomic History   Marital status: Married    Spouse name: Not on file   Number of children: 2   Years of education: Not on file   Highest education level: Not on  file  Occupational History   Not on file  Tobacco Use   Smoking status: Never   Smokeless tobacco: Never  Vaping Use   Vaping Use: Never used  Substance and Sexual Activity   Alcohol use: Yes    Alcohol/week: 4.0 standard drinks    Types: 4 Glasses of wine per week   Drug use: No   Sexual activity: Yes    Partners: Male    Birth control/protection: Surgical    Comment: BTL  Other Topics Concern   Not on file  Social History Narrative   Not on file   Social Determinants of Health   Financial Resource Strain: Not on file  Food Insecurity: Not on file  Transportation Needs: Not on file  Physical Activity: Not on file  Stress: Not on file  Social Connections: Not on file     Family History: The patient's family history includes Alzheimer's disease in her father; Arthritis in her mother; Breast cancer (age of onset: 49) in her maternal aunt; Diabetes in her maternal grandmother and paternal grandfather; Hypertension in her father and mother; Mitral valve prolapse in her mother; Varicose Veins in her mother. ROS:   Please see the history of present illness.    All other systems reviewed and are negative.  EKGs/Labs/Other Studies Reviewed:    The following studies were reviewed today:  EKG:  EKG ordered today and personally reviewed.  The ekg ordered today demonstrates sinus rhythm otherwise normal  Recent Labs: 01/07/2021: ALT 19; BUN 13; Creatinine 0.84; Hemoglobin 14.8; Platelet Count 216; Potassium 4.0; Sodium 141  Recent Lipid Panel    Component Value Date/Time   CHOL 165 01/18/2020 0841   TRIG 56 01/18/2020 0841   HDL 68 01/18/2020 0841   CHOLHDL 2.4 01/18/2020 0841   CHOLHDL 2.4 03/03/2016 1025   VLDL 11 03/03/2016 1025   LDLCALC 86 01/18/2020 0841    Physical Exam:    VS:  LMP 06/15/2003     Wt Readings from Last 3 Encounters:  01/07/21 142 lb 14.4 oz (64.8 kg)  05/12/20 145 lb (65.8 kg)  01/07/20 145 lb 9.6 oz (66 kg)    GEN:  Well nourished, well  developed in no acute distress HEENT: Normal NECK: No JVD; No carotid bruits LYMPHATICS: No lymphadenopathy CARDIAC: RRR, no murmurs, rubs, gallops RESPIRATORY:  Clear to auscultation without rales, wheezing or rhonchi  ABDOMEN: Soft, non-tender, non-distended MUSCULOSKELETAL:  No edema; No deformity  SKIN: Warm and dry NEUROLOGIC:  Alert and oriented x 3 PSYCHIATRIC:  Normal affect    Signed, Shirlee More, MD  01/12/2021 9:37 AM    [place]

## 2021-01-12 ENCOUNTER — Other Ambulatory Visit: Payer: Self-pay

## 2021-01-12 ENCOUNTER — Encounter: Payer: Self-pay | Admitting: *Deleted

## 2021-01-12 ENCOUNTER — Ambulatory Visit: Payer: Medicare PPO | Admitting: Cardiology

## 2021-01-12 ENCOUNTER — Encounter: Payer: Self-pay | Admitting: Cardiology

## 2021-01-12 VITALS — BP 118/70 | HR 60 | Ht 68.6 in | Wt 144.6 lb

## 2021-01-12 DIAGNOSIS — R55 Syncope and collapse: Secondary | ICD-10-CM | POA: Diagnosis not present

## 2021-01-12 DIAGNOSIS — I341 Nonrheumatic mitral (valve) prolapse: Secondary | ICD-10-CM

## 2021-01-12 DIAGNOSIS — I471 Supraventricular tachycardia: Secondary | ICD-10-CM | POA: Diagnosis not present

## 2021-01-12 MED ORDER — METOPROLOL SUCCINATE ER 25 MG PO TB24: 12.5000 mg | ORAL_TABLET | ORAL | 3 refills | Status: DC

## 2021-01-12 NOTE — Patient Instructions (Signed)
Medication Instructions:  Your physician has recommended you make the following change in your medication:  DECREASE: TOPROL XL to three times weekly on Monday, Wednesday, and Friday.  *If you need a refill on your cardiac medications before your next appointment, please call your pharmacy*   Lab Work: None If you have labs (blood work) drawn today and your tests are completely normal, you will receive your results only by: [place] (if you have MyChart) OR A paper copy in the mail If you have any lab test that is abnormal or we need to change your treatment, we will call you to review the results.   Testing/Procedures: None   Follow-Up: At [facility], you and your health needs are our priority.  As part of our continuing mission to provide you with exceptional heart care, we have created designated Provider Care Teams.  These Care Teams include your primary Cardiologist (physician) and Advanced Practice Providers (APPs -  Physician Assistants and Nurse Practitioners) who all work together to provide you with the care you need, when you need it.  We recommend signing up for the patient portal called "MyChart".  Sign up information is provided on this After Visit Summary.  MyChart is used to connect with patients for Virtual Visits (Telemedicine).  Patients are able to view lab/test results, encounter notes, upcoming appointments, etc.  Non-urgent messages can be sent to your provider as well.   To learn more about what you can do with MyChart, go to [email or web address].    Your next appointment:   1 year(s)  The format for your next appointment:   In Person  Provider:   Shirlee More, MD   Other Instructions

## 2021-01-13 ENCOUNTER — Ambulatory Visit
Admission: RE | Admit: 2021-01-13 | Discharge: 2021-01-13 | Disposition: A | Discharge status: Home / Self Care | Payer: Medicare PPO | Source: Ambulatory Visit | Attending: General Surgery | Admitting: General Surgery

## 2021-01-13 DIAGNOSIS — D0512 Intraductal carcinoma in situ of left breast: Secondary | ICD-10-CM

## 2021-01-13 DIAGNOSIS — C50412 Malignant neoplasm of upper-outer quadrant of left female breast: Secondary | ICD-10-CM | POA: Diagnosis not present

## 2021-01-13 MED ORDER — GADOBUTROL 1 MMOL/ML IV SOLN
7.0000 mL | Freq: Once | INTRAVENOUS | Status: AC | PRN
  Administered 2021-01-13: 7 mL via INTRAVENOUS

## 2021-01-15 ENCOUNTER — Ambulatory Visit: Payer: Self-pay | Admitting: General Surgery

## 2021-01-15 ENCOUNTER — Encounter: Payer: Self-pay | Admitting: *Deleted

## 2021-01-15 DIAGNOSIS — D0512 Intraductal carcinoma in situ of left breast: Secondary | ICD-10-CM

## 2021-01-19 ENCOUNTER — Encounter: Payer: Self-pay | Admitting: *Deleted

## 2021-01-19 DIAGNOSIS — D0512 Intraductal carcinoma in situ of left breast: Secondary | ICD-10-CM

## 2021-01-20 ENCOUNTER — Other Ambulatory Visit: Payer: Self-pay

## 2021-01-20 ENCOUNTER — Ambulatory Visit: Payer: Medicare PPO | Admitting: Genetic Counselor

## 2021-01-21 ENCOUNTER — Other Ambulatory Visit: Payer: Self-pay | Admitting: Radiation Oncology

## 2021-01-21 ENCOUNTER — Inpatient Hospital Stay
Admission: RE | Admit: 2021-01-21 | Discharge: 2021-01-21 | Disposition: A | Discharge status: Home / Self Care | Payer: Self-pay | Source: Ambulatory Visit | Attending: Radiation Oncology | Admitting: Radiation Oncology

## 2021-01-21 DIAGNOSIS — D0512 Intraductal carcinoma in situ of left breast: Secondary | ICD-10-CM

## 2021-01-26 ENCOUNTER — Telehealth: Payer: Self-pay | Admitting: *Deleted

## 2021-01-26 NOTE — Telephone Encounter (Signed)
Spoke with patient to answer some questions she had regarding surgery and xrt.  Encouraged her to call should anything else arise. Patient verbalized understanding.

## 2021-01-28 NOTE — Progress Notes (Addendum)
Addendum: Genetic testing is negative for the familial pathogenic KCNH2 variant. This was communicated to patient. She is relieved to hear this news. No further screening required for patient and her children.    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  Personal Medical Information Kelli (III.3 on pedigree) is a very pleasant 70 year-old Caucasian woman is here today with her husband.  She reports having bradycardia, and some arrhythmia. Had a heart murmur after the birth of her first child at age 71 and now has mitral valve prolapse. She tells me of a syncope event while travelling in [place]. She remarks that they had traveled extensively prior to the event and she suspects that she fainted from being dehydrated and fatigued. Upon returning she wore a Holter monitor that showed short stretched of arrhythmia and is now on a beta blocker.   Family history Kelli (III.5) has two older brothers, ages 56 and 20 (III.1, III.2)- both in good health as are their children (IV.1-IV.5). Kelli has a 43 y.o. daughter (IV.6) and 52 y.o. son (IV.7) and a 11 y.o grandson (V.1)- all in good health. Her mother (II.2) is 40 with no overt heart issues other than HTN. She reports significant history of LQTS in one of her maternal uncles, now age 74. (II.1).  Her maternal uncle (II.1) was diagnosed with LQTS at age 72 when he had a syncopal episode while at his assisted living facility. He now has a pacemaker. The proband in this family is Kelli Saunders (IV.9), her cousin's daughter who was found to harbor the KCNH2 P632A variant in 2002 by the research laboratory of Kelli Saunders, Ph.D at the [facility], [place], [place]. This variant was subsequently seen in 6 symptomatic relatives, including  Kelli's cousin, Kelli Saunders (III.6), Annettes's father, as above (II.4) both of Kristie's kids (V.4, 5) as well as two of their cousins (V.2, V.6). Relatives that phenotype negative and genotype positive include Kelli Saunders' siblings (IV.8, IV.10) and one of their sons (V.3).    Test Report Kelli has provided copies of the test reports from [facility] Kelli Saunders) and [facility] for her grandson (V.16). Genetic testing performed on both identified a disease-causing variant in the KCNH2 gene. 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 This protein mediates the cardiac Ikr current, an important determinant of action potential repolarization in the human ventricle.  This note also includes the re-interpretation of their genetic test result.  This variant was re-interpreted to verify the test report interpretation and to compile the information in light of the patient's family history.  The KCNH2 c. 1894C>G, p.Pro632Ala variant is a missense variant located in the pore domain of the protein. Several mutations in this domain have been implicated in LQTS. This variant is not observed in general population indicating that it is a rare variant. To date, there is only 1 report of the P632A variant in a LQTS patient [facility] et al, 2012) and a report from one other clinical lab in a patient, presumably with LQTS. Extensive family studies in this family, indicate that the variant segregates with disease in four generations amongst 7 relatives. In light of this, the KCNH2 c. 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 Interestingly, another amino  acid change, namely Pro632Ser has been reported as a pathogenic variant for LQTS and has been noted in several unrelated LQTS patients.  Impression and Plan Kelli is very concerned about the riskof her children and grandchild inheriting the familial likely  pathogenic variant. I explained to her that considering her mother is now 3 and in good health it is highly unlikely that she has the KCNH2 variant that was detected in her brother at age 41. Nevertheless, Kelli has a 25% chance of inheriting this variant. I explained to her that there is a 75% chance that she will have a negative result in her genetic test for the familial variant. She verbalized understanding of this.    In addition, we discussed the protections afforded by the Genetic Information Non-Discrimination Act (GINA). I explained to her that GINA protects her from losing her employment or health insurance based on her genotype. However, these protections do not cover life insurance and disability. She verbalized understanding of this and informs me that both her kids have life insurance  Plan After a thorough discussion of the risk and benefits of genetic testing Kelli states her intent to pursue genetic testing and signed the informed consent form. Blood was drawn today for testing.   Lattie Corns, Ph.D, [facility] Clinical Molecular Geneticist

## 2021-01-29 ENCOUNTER — Encounter: Payer: Self-pay | Admitting: *Deleted

## 2021-01-30 ENCOUNTER — Telehealth: Payer: Self-pay

## 2021-01-30 ENCOUNTER — Other Ambulatory Visit: Payer: Self-pay

## 2021-01-30 DIAGNOSIS — I4589 Other specified conduction disorders: Secondary | ICD-10-CM

## 2021-01-30 NOTE — Telephone Encounter (Signed)
Spoke to the patient just now and let her know that I have placed this order and she Is planning on coming to get these labs done soon.    Encouraged patient to call back with any questions or concerns.

## 2021-01-30 NOTE — Telephone Encounter (Signed)
-----   Message from Richardo Priest, MD sent at 01/29/2021  7:06 PM EDT ----- Please order the test. ----- Message ----- From: Debbe Mounts, PhD Sent: 01/28/2021   9:25 AM EDT To: Richardo Priest, MD  Dr. Bettina Gavia, Could you please place an order for genetic testing of the LQTS known mutation. We will need that to start her test. To place the order, please go to labs, select "[facility]" and then Familial variant -Channelopathy. Do let me know if you have trouble placing the order and I can reach out to the clinical supervisor to help you with this. Thanks [facility]

## 2021-02-02 DIAGNOSIS — Z20822 Contact with and (suspected) exposure to covid-19: Secondary | ICD-10-CM | POA: Diagnosis not present

## 2021-02-05 ENCOUNTER — Other Ambulatory Visit: Payer: Self-pay

## 2021-02-05 ENCOUNTER — Telehealth: Payer: Self-pay | Admitting: Cardiology

## 2021-02-05 DIAGNOSIS — I4589 Other specified conduction disorders: Secondary | ICD-10-CM

## 2021-02-05 NOTE — Telephone Encounter (Signed)
Left vm to callback as she needs to see Dt. Broadus John before the labs are done as per the email.

## 2021-02-05 NOTE — Progress Notes (Signed)
Unable th to utilized any of the order on file for [facility] test. Patient will be contacting the [place] office for more info

## 2021-02-05 NOTE — Telephone Encounter (Signed)
Pt was called last Friday by Lilia Pro and advised she needed more lab work, pt came to office this morning to get lab work but order could not be released because Dr. Bettina Gavia did not sign order. Pt would like to know if the order can be signed so she can get her lab work.Pt is currently out running errands and she will have her cellphone on

## 2021-02-05 NOTE — Telephone Encounter (Signed)
This patient came to the [place] office for labs but we only do labs for [facility]. Any thoughts on where to send this patient? Reuben Likes, RN

## 2021-02-06 NOTE — Telephone Encounter (Signed)
Sent message to pharm D who called for pt to come here and advised we do not do the labs in [place] with [facility].

## 2021-02-09 ENCOUNTER — Telehealth: Payer: Self-pay | Admitting: Cardiology

## 2021-02-09 DIAGNOSIS — Z1379 Encounter for other screening for genetic and chromosomal anomalies: Secondary | ICD-10-CM

## 2021-02-09 NOTE — Telephone Encounter (Signed)
Follow up:       Patient calling to check the status of some lab testing. Please advise

## 2021-02-09 NOTE — Telephone Encounter (Signed)
Spoke to the patient just now and let her know that I am going to reach out to Dr .Broadus John in regard to this as I am unsure how to get the lab order placed with the barcode.

## 2021-02-18 ENCOUNTER — Encounter (HOSPITAL_BASED_OUTPATIENT_CLINIC_OR_DEPARTMENT_OTHER): Payer: Self-pay | Admitting: General Surgery

## 2021-02-18 ENCOUNTER — Other Ambulatory Visit: Payer: Self-pay

## 2021-02-23 DIAGNOSIS — D0512 Intraductal carcinoma in situ of left breast: Secondary | ICD-10-CM | POA: Diagnosis not present

## 2021-02-23 NOTE — Progress Notes (Signed)

## 2021-02-25 ENCOUNTER — Ambulatory Visit (HOSPITAL_BASED_OUTPATIENT_CLINIC_OR_DEPARTMENT_OTHER): Payer: Medicare PPO | Admitting: Anesthesiology

## 2021-02-25 ENCOUNTER — Encounter (HOSPITAL_BASED_OUTPATIENT_CLINIC_OR_DEPARTMENT_OTHER)
Admission: RE | Disposition: A | Discharge status: Home / Self Care | Payer: Self-pay | Source: Home / Self Care | Attending: General Surgery

## 2021-02-25 ENCOUNTER — Other Ambulatory Visit: Payer: Self-pay

## 2021-02-25 ENCOUNTER — Ambulatory Visit (HOSPITAL_COMMUNITY)
Admission: RE | Admit: 2021-02-25 | Discharge: 2021-02-25 | Disposition: A | Discharge status: Home / Self Care | Payer: Medicare PPO | Source: Ambulatory Visit | Attending: General Surgery | Admitting: General Surgery

## 2021-02-25 ENCOUNTER — Ambulatory Visit (HOSPITAL_BASED_OUTPATIENT_CLINIC_OR_DEPARTMENT_OTHER)
Admission: RE | Admit: 2021-02-25 | Discharge: 2021-02-25 | Disposition: A | Discharge status: Home / Self Care | Payer: Medicare PPO | Attending: General Surgery | Admitting: General Surgery

## 2021-02-25 DIAGNOSIS — Z8249 Family history of ischemic heart disease and other diseases of the circulatory system: Secondary | ICD-10-CM | POA: Insufficient documentation

## 2021-02-25 DIAGNOSIS — Z170 Estrogen receptor positive status [ER+]: Secondary | ICD-10-CM | POA: Diagnosis not present

## 2021-02-25 DIAGNOSIS — D0512 Intraductal carcinoma in situ of left breast: Secondary | ICD-10-CM

## 2021-02-25 DIAGNOSIS — E785 Hyperlipidemia, unspecified: Secondary | ICD-10-CM | POA: Diagnosis not present

## 2021-02-25 DIAGNOSIS — C50912 Malignant neoplasm of unspecified site of left female breast: Secondary | ICD-10-CM | POA: Diagnosis not present

## 2021-02-25 DIAGNOSIS — Z882 Allergy status to sulfonamides status: Secondary | ICD-10-CM | POA: Diagnosis not present

## 2021-02-25 DIAGNOSIS — F32A Depression, unspecified: Secondary | ICD-10-CM | POA: Diagnosis not present

## 2021-02-25 DIAGNOSIS — I341 Nonrheumatic mitral (valve) prolapse: Secondary | ICD-10-CM | POA: Diagnosis not present

## 2021-02-25 DIAGNOSIS — G8918 Other acute postprocedural pain: Secondary | ICD-10-CM | POA: Diagnosis not present

## 2021-02-25 HISTORY — PX: BREAST LUMPECTOMY WITH RADIOACTIVE SEED AND SENTINEL LYMPH NODE BIOPSY: SHX6550

## 2021-02-25 SURGERY — BREAST LUMPECTOMY WITH RADIOACTIVE SEED AND SENTINEL LYMPH NODE BIOPSY
Anesthesia: General | Site: Breast | Laterality: Left

## 2021-02-25 MED ORDER — CEFAZOLIN SODIUM-DEXTROSE 2-4 GM/100ML-% IV SOLN
INTRAVENOUS | Status: AC
  Filled 2021-02-25: qty 100

## 2021-02-25 MED ORDER — BUPIVACAINE-EPINEPHRINE 0.25% -1:200000 IJ SOLN
INTRAMUSCULAR | Status: DC | PRN
  Administered 2021-02-25: 20 mL

## 2021-02-25 MED ORDER — LIDOCAINE 2% (20 MG/ML) 5 ML SYRINGE
INTRAMUSCULAR | Status: AC
  Filled 2021-02-25: qty 5

## 2021-02-25 MED ORDER — GABAPENTIN 300 MG PO CAPS
ORAL_CAPSULE | ORAL | Status: AC
  Filled 2021-02-25: qty 1

## 2021-02-25 MED ORDER — CEFAZOLIN SODIUM-DEXTROSE 2-4 GM/100ML-% IV SOLN
2.0000 g | INTRAVENOUS | Status: AC
Start: 2021-02-25 — End: 2021-02-25
  Administered 2021-02-25: 2 g via INTRAVENOUS

## 2021-02-25 MED ORDER — ONDANSETRON HCL 4 MG/2ML IJ SOLN
INTRAMUSCULAR | Status: DC | PRN
  Administered 2021-02-25: 4 mg via INTRAVENOUS

## 2021-02-25 MED ORDER — TECHNETIUM TC 99M TILMANOCEPT KIT
1.0000 | PACK | Freq: Once | INTRAVENOUS | Status: AC | PRN
  Administered 2021-02-25: 1 via INTRADERMAL

## 2021-02-25 MED ORDER — HYDROCODONE-ACETAMINOPHEN 5-325 MG PO TABS: 1.0000 | ORAL_TABLET | Freq: Four times a day (QID) | ORAL | 0 refills | Status: DC | PRN

## 2021-02-25 MED ORDER — PHENYLEPHRINE HCL (PRESSORS) 10 MG/ML IV SOLN
INTRAVENOUS | Status: DC | PRN
  Administered 2021-02-25 (×2): 40 ug via INTRAVENOUS
  Administered 2021-02-25: 80 ug via INTRAVENOUS
  Administered 2021-02-25 (×3): 40 ug via INTRAVENOUS

## 2021-02-25 MED ORDER — ACETAMINOPHEN 500 MG PO TABS
1000.0000 mg | ORAL_TABLET | ORAL | Status: AC
  Administered 2021-02-25: 1000 mg via ORAL

## 2021-02-25 MED ORDER — LACTATED RINGERS IV SOLN: INTRAVENOUS | Status: DC

## 2021-02-25 MED ORDER — EPHEDRINE 5 MG/ML INJ
INTRAVENOUS | Status: AC
  Filled 2021-02-25: qty 5

## 2021-02-25 MED ORDER — DEXAMETHASONE SODIUM PHOSPHATE 10 MG/ML IJ SOLN
INTRAMUSCULAR | Status: AC
  Filled 2021-02-25: qty 1

## 2021-02-25 MED ORDER — OXYCODONE HCL 5 MG/5ML PO SOLN
5.0000 mg | Freq: Once | ORAL | Status: DC | PRN
Start: 2021-02-25 — End: 2021-02-25

## 2021-02-25 MED ORDER — PROPOFOL 10 MG/ML IV BOLUS
INTRAVENOUS | Status: AC
  Filled 2021-02-25: qty 20

## 2021-02-25 MED ORDER — GABAPENTIN 300 MG PO CAPS
300.0000 mg | ORAL_CAPSULE | ORAL | Status: AC
  Administered 2021-02-25: 300 mg via ORAL

## 2021-02-25 MED ORDER — FENTANYL CITRATE (PF) 100 MCG/2ML IJ SOLN
INTRAMUSCULAR | Status: AC
  Filled 2021-02-25: qty 2

## 2021-02-25 MED ORDER — FENTANYL CITRATE (PF) 100 MCG/2ML IJ SOLN
INTRAMUSCULAR | Status: DC | PRN
  Administered 2021-02-25: 50 ug via INTRAVENOUS
  Administered 2021-02-25: 25 ug via INTRAVENOUS

## 2021-02-25 MED ORDER — CHLORHEXIDINE GLUCONATE CLOTH 2 % EX PADS: 6.0000 | MEDICATED_PAD | Freq: Once | CUTANEOUS | Status: DC

## 2021-02-25 MED ORDER — EPHEDRINE SULFATE 50 MG/ML IJ SOLN
INTRAMUSCULAR | Status: DC | PRN
  Administered 2021-02-25 (×5): 5 mg via INTRAVENOUS

## 2021-02-25 MED ORDER — FENTANYL CITRATE (PF) 100 MCG/2ML IJ SOLN
100.0000 ug | Freq: Once | INTRAMUSCULAR | Status: AC
  Administered 2021-02-25: 50 ug via INTRAVENOUS

## 2021-02-25 MED ORDER — ONDANSETRON HCL 4 MG/2ML IJ SOLN: 4.0000 mg | Freq: Four times a day (QID) | INTRAMUSCULAR | Status: DC | PRN

## 2021-02-25 MED ORDER — MIDAZOLAM HCL 2 MG/2ML IJ SOLN
INTRAMUSCULAR | Status: AC
  Filled 2021-02-25: qty 2

## 2021-02-25 MED ORDER — PHENYLEPHRINE 40 MCG/ML (10ML) SYRINGE FOR IV PUSH (FOR BLOOD PRESSURE SUPPORT)
PREFILLED_SYRINGE | INTRAVENOUS | Status: AC
  Filled 2021-02-25: qty 10

## 2021-02-25 MED ORDER — MIDAZOLAM HCL 2 MG/2ML IJ SOLN
2.0000 mg | Freq: Once | INTRAMUSCULAR | Status: AC
  Administered 2021-02-25: 1 mg via INTRAVENOUS

## 2021-02-25 MED ORDER — OXYCODONE HCL 5 MG PO TABS: 5.0000 mg | ORAL_TABLET | Freq: Once | ORAL | Status: DC | PRN

## 2021-02-25 MED ORDER — LIDOCAINE HCL (CARDIAC) PF 100 MG/5ML IV SOSY
PREFILLED_SYRINGE | INTRAVENOUS | Status: DC | PRN
  Administered 2021-02-25: 60 mg via INTRATRACHEAL

## 2021-02-25 MED ORDER — ACETAMINOPHEN 500 MG PO TABS
ORAL_TABLET | ORAL | Status: AC
  Filled 2021-02-25: qty 2

## 2021-02-25 MED ORDER — ONDANSETRON HCL 4 MG/2ML IJ SOLN
INTRAMUSCULAR | Status: AC
  Filled 2021-02-25: qty 2

## 2021-02-25 MED ORDER — DEXAMETHASONE SODIUM PHOSPHATE 10 MG/ML IJ SOLN
INTRAMUSCULAR | Status: DC | PRN
  Administered 2021-02-25: 10 mg via INTRAVENOUS

## 2021-02-25 MED ORDER — FENTANYL CITRATE (PF) 100 MCG/2ML IJ SOLN: 25.0000 ug | INTRAMUSCULAR | Status: DC | PRN

## 2021-02-25 MED ORDER — BUPIVACAINE-EPINEPHRINE (PF) 0.5% -1:200000 IJ SOLN
INTRAMUSCULAR | Status: DC | PRN
  Administered 2021-02-25: 20 mL via PERINEURAL

## 2021-02-25 MED ORDER — PROPOFOL 10 MG/ML IV BOLUS
INTRAVENOUS | Status: DC | PRN
  Administered 2021-02-25: 150 mg via INTRAVENOUS

## 2021-02-25 SURGICAL SUPPLY — 45 items
ADH SKN CLS APL DERMABOND .7 (GAUZE/BANDAGES/DRESSINGS) ×1
APL PRP STRL LF DISP 70% ISPRP (MISCELLANEOUS) ×1
APPLIER CLIP 9.375 MED OPEN (MISCELLANEOUS) ×2
APR CLP MED 9.3 20 MLT OPN (MISCELLANEOUS) ×1
BLADE SURG 15 STRL LF DISP TIS (BLADE) ×1 IMPLANT
BLADE SURG 15 STRL SS (BLADE) ×2
CANISTER SUC SOCK COL 7IN (MISCELLANEOUS) IMPLANT
CANISTER SUCT 1200ML W/VALVE (MISCELLANEOUS) IMPLANT
CHLORAPREP W/TINT 26 (MISCELLANEOUS) ×2 IMPLANT
CLIP APPLIE 9.375 MED OPEN (MISCELLANEOUS) ×1 IMPLANT
COVER BACK TABLE 60X90IN (DRAPES) ×2 IMPLANT
COVER MAYO STAND STRL (DRAPES) ×2 IMPLANT
COVER PROBE W GEL 5X96 (DRAPES) ×2 IMPLANT
DECANTER SPIKE VIAL GLASS SM (MISCELLANEOUS) IMPLANT
DERMABOND ADVANCED (GAUZE/BANDAGES/DRESSINGS) ×1
DERMABOND ADVANCED .7 DNX12 (GAUZE/BANDAGES/DRESSINGS) ×1 IMPLANT
DRAPE LAPAROSCOPIC ABDOMINAL (DRAPES) ×2 IMPLANT
DRAPE UTILITY XL STRL (DRAPES) ×2 IMPLANT
ELECT COATED BLADE 2.86 ST (ELECTRODE) ×2 IMPLANT
ELECT REM PT RETURN 9FT ADLT (ELECTROSURGICAL) ×2
ELECTRODE REM PT RTRN 9FT ADLT (ELECTROSURGICAL) ×1 IMPLANT
GLOVE SURG ENC MOIS LTX SZ7.5 (GLOVE) ×2 IMPLANT
GLOVE SURG UNDER POLY LF SZ6.5 (GLOVE) ×2 IMPLANT
GLOVE SURG UNDER POLY LF SZ7 (GLOVE) ×2 IMPLANT
GOWN STRL REUS W/ TWL LRG LVL3 (GOWN DISPOSABLE) ×2 IMPLANT
GOWN STRL REUS W/TWL LRG LVL3 (GOWN DISPOSABLE) ×4
ILLUMINATOR WAVEGUIDE N/F (MISCELLANEOUS) IMPLANT
KIT MARKER MARGIN INK (KITS) ×2 IMPLANT
LIGHT WAVEGUIDE WIDE FLAT (MISCELLANEOUS) IMPLANT
NDL SAFETY ECLIPSE 18X1.5 (NEEDLE) IMPLANT
NEEDLE HYPO 18GX1.5 SHARP (NEEDLE)
NEEDLE HYPO 25X1 1.5 SAFETY (NEEDLE) ×2 IMPLANT
NS IRRIG 1000ML POUR BTL (IV SOLUTION) IMPLANT
PACK BASIN DAY SURGERY FS (CUSTOM PROCEDURE TRAY) ×2 IMPLANT
PENCIL SMOKE EVACUATOR (MISCELLANEOUS) ×2 IMPLANT
SLEEVE SCD COMPRESS KNEE MED (STOCKING) ×2 IMPLANT
SPONGE T-LAP 18X18 ~~LOC~~+RFID (SPONGE) ×2 IMPLANT
SUT MON AB 4-0 PC3 18 (SUTURE) ×4 IMPLANT
SUT SILK 2 0 SH (SUTURE) IMPLANT
SUT VICRYL 3-0 CR8 SH (SUTURE) ×2 IMPLANT
SYR CONTROL 10ML LL (SYRINGE) ×2 IMPLANT
TOWEL GREEN STERILE FF (TOWEL DISPOSABLE) ×2 IMPLANT
TRAY FAXITRON CT DISP (TRAY / TRAY PROCEDURE) ×2 IMPLANT
TUBE CONNECTING 20X1/4 (TUBING) IMPLANT
YANKAUER SUCT BULB TIP NO VENT (SUCTIONS) IMPLANT

## 2021-02-25 NOTE — Anesthesia Postprocedure Evaluation (Signed)
Anesthesia Post Note  Patient: Kelli Saunders  Procedure(s) Performed: LEFT BREAST LUMPECTOMY WITH RADIOACTIVE SEED AND SENTINEL LYMPH NODE BIOPSY (Left: Breast)     Patient location during evaluation: PACU Anesthesia Type: General Level of consciousness: awake and alert Pain management: pain level controlled Vital Signs Assessment: post-procedure vital signs reviewed and stable Respiratory status: spontaneous breathing, nonlabored ventilation, respiratory function stable and patient connected to nasal cannula oxygen Cardiovascular status: blood pressure returned to baseline and stable Postop Assessment: no apparent nausea or vomiting Anesthetic complications: no   No notable events documented.  Last Vitals:  Vitals:   02/25/21 1022 02/25/21 1043  BP:  130/63  Pulse:  (!) 55  Resp:  16  Temp:  36.4 C  SpO2: 99% 100%    Last Pain:  Vitals:   02/25/21 1043  TempSrc:   PainSc: 0-No pain                 Brietta Manso S

## 2021-02-25 NOTE — Progress Notes (Signed)
Assisted Dr. Marcie Bal with left, ultrasound guided, pectoralis block. Side rails up, monitors on throughout procedure. See vital signs in flow sheet. Tolerated Procedure well.

## 2021-02-25 NOTE — Anesthesia Procedure Notes (Signed)
Procedure Name: LMA Insertion Date/Time: 02/25/2021 8:39 AM Performed by: Glory Buff, CRNA Pre-anesthesia Checklist: Patient identified, Emergency Drugs available, Suction available and Patient being monitored Patient Re-evaluated:Patient Re-evaluated prior to induction Oxygen Delivery Method: Circle system utilized Preoxygenation: Pre-oxygenation with 100% oxygen Induction Type: IV induction LMA: LMA inserted LMA Size: 4.0 Number of attempts: 1 Placement Confirmation: positive ETCO2 Tube secured with: Tape Dental Injury: Teeth and Oropharynx as per pre-operative assessment

## 2021-02-25 NOTE — H&P (Signed)
PROVIDER: Landry Corporal, MD  MRN: [id number] DOB: [birth year]   Subjective   Chief Complaint: Breast Cancer   History of Present Illness: Kelli Saunders is a 70 y.o. female who is seen today as an office consultation at the request of Dr. Pasty Arch for evaluation of Breast Cancer .   We are asked to see the patient in consultation by Dr. Burr Medico to evaluate her for a new left breast cancer. The patient is a 70 year old white female who recently went for a screening mammogram. At that time she was found to have calcifications in the central 12 o'clock position of the left breast measuring 2.2 cm. 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 She is otherwise in good health and does not smoke.  Review of Systems: A complete review of systems was obtained from the patient. I have reviewed this information and discussed as appropriate with the patient. See HPI as well for other ROS.  ROS   Medical History: Past Medical History:  Diagnosis Date   Depression   Mitral valve prolapse   Osteopenia   Patient Active Problem List  Diagnosis   Ductal carcinoma in situ (DCIS) of left breast   Hyperlipidemia   Left atrial enlargement   Mitral valve prolapse   Osteopenia   Palpitation   Pectus excavatum   Thoracic scoliosis   Wrist fracture, right   Family history of long QT syndrome   Depression   Past Surgical History:  Procedure Laterality Date   ABDOMINAL HYSTERECTOMY   CERVICAL DISC ARTHROPLASTY; ANTERIOR APPROACH N/A 2007    Allergies  Allergen Reactions   Sulfa (Sulfonamide Antibiotics) Itching   No current outpatient medications on file prior to visit.   No current facility-administered medications on file prior to visit.   History reviewed. No pertinent family history.   Social History   Tobacco Use  Smoking Status Never Smoker  Smokeless Tobacco Never Used    Social History   Socioeconomic History    Marital status: Married  Tobacco Use   Smoking status: Never Smoker   Smokeless tobacco: Never Used  Substance and Sexual Activity   Alcohol use: Yes  Alcohol/week: 4.0 standard drinks  Types: 4 Standard drinks or equivalent per week   Objective:   There were no vitals filed for this visit.  There is no height or weight on file to calculate BMI.  Physical Exam Vitals reviewed.  Constitutional:  General: She is not in acute distress. Appearance: Normal appearance.  HENT:  Head: Normocephalic and atraumatic.  Right Ear: External ear normal.  Left Ear: External ear normal.  Nose: Nose normal.  Mouth/Throat:  Mouth: Mucous membranes are moist.  Pharynx: Oropharynx is clear.  Eyes:  General: No scleral icterus. Extraocular Movements: Extraocular movements intact.  Conjunctiva/sclera: Conjunctivae normal.  Pupils: Pupils are equal, round, and reactive to light.  Cardiovascular:  Rate and Rhythm: Normal rate and regular rhythm.  Pulses: Normal pulses.  Heart sounds: Normal heart sounds.  Pulmonary:  Effort: Pulmonary effort is normal. No respiratory distress.  Breath sounds: Normal breath sounds.  Abdominal:  General: Bowel sounds are normal.  Palpations: Abdomen is soft.  Tenderness: There is no abdominal tenderness.  Musculoskeletal:  General: No swelling, tenderness or deformity. Normal range of motion.  Cervical back: Normal range of motion and neck supple.  Skin: General: Skin is warm and dry.  Coloration: Skin is not jaundiced.  Neurological:  General: No focal deficit  present.  Mental Status: She is alert and oriented to person, place, and time.  Psychiatric:  Mood and Affect: Mood normal.  Behavior: Behavior normal.   Breast: There is no palpable mass in either breast. There is no palpable axillary, supraclavicular, or cervical lymphadenopathy  Labs, Imaging and Diagnostic Testing:  Assessment and Plan:  Diagnoses and all orders for this visit:  Ductal  carcinoma in situ (DCIS) of left breast    The patient appears to have a small area of ductal carcinoma in situ in the central upper portion of the left breast. I have discussed with her in detail the different options for treatment and at this point she is still undecided between lumpectomy and mastectomy. We will obtain an MRI study to try to determine if the nipple can be spared with breast conservation. We will call her with the results of the study and she will let us know once she has made a decision. I have discussed with her in detail the risks and benefits of the operations as well as some of the technical aspects including the possible use of a radioactive seed if breast conservation is chosen and she understands and wishes to proceed

## 2021-02-25 NOTE — Progress Notes (Signed)
Nuc med injections completed. Patient tolerated well.   

## 2021-02-25 NOTE — Discharge Instructions (Signed)

## 2021-02-25 NOTE — Anesthesia Procedure Notes (Signed)
Anesthesia Regional Block: Pectoralis block   Pre-Anesthetic Checklist: , timeout performed,  Correct Patient, Correct Site, Correct Laterality,  Correct Procedure, Correct Position, site marked,  Risks and benefits discussed,  Surgical consent,  Pre-op evaluation,  At surgeon's request and post-op pain management  Laterality: Left  Prep: chloraprep       Needles:  Injection technique: Single-shot  Needle Type: Echogenic Needle     Needle Length: 9cm  Needle Gauge: 21     Additional Needles:   Narrative:  Start time: 02/25/2021 7:48 AM End time: 02/25/2021 7:59 AM Injection made incrementally with aspirations every 5 mL.  Performed by: Personally  Anesthesiologist: Albertha Ghee, MD  Additional Notes: Pt tolerated the procedure well.

## 2021-02-25 NOTE — Interval H&P Note (Signed)
History and Physical Interval Note:  02/25/2021 8:16 AM  Kelli Saunders  has presented today for surgery, with the diagnosis of LEFT BREAST DCIS.  The various methods of treatment have been discussed with the patient and family. After consideration of risks, benefits and other options for treatment, the patient has consented to  Procedure(s): LEFT BREAST LUMPECTOMY WITH RADIOACTIVE SEED AND SENTINEL LYMPH NODE BIOPSY (Left) as a surgical intervention.  The patient's history has been reviewed, patient examined, no change in status, stable for surgery.  I have reviewed the patient's chart and labs.  Questions were answered to the patient's satisfaction.     Autumn Messing III

## 2021-02-25 NOTE — Op Note (Signed)
02/25/2021  9:49 AM  PATIENT:  Kelli Saunders  70 y.o. female  PRE-OPERATIVE DIAGNOSIS:  LEFT BREAST DUCTAL CARCINOMA IN SITU  POST-OPERATIVE DIAGNOSIS:  LEFT BREAST DUCTAL CARCINOMA IN SITU  PROCEDURE:  Procedure(s): LEFT BREAST LUMPECTOMY WITH RADIOACTIVE SEED LOCALIZATION AND DEEP LEFT AXILLARY SENTINEL LYMPH NODE BIOPSY (Left)  SURGEON:  Surgeon(s) and Role:    * Jovita Kussmaul, MD - Primary  PHYSICIAN ASSISTANT:   ASSISTANTS: none   ANESTHESIA:   local and general  EBL:  5 mL   BLOOD ADMINISTERED:none  DRAINS: none   LOCAL MEDICATIONS USED:  MARCAINE     SPECIMEN:  Source of Specimen:  left breast tissue with sentinel nodes x 3  DISPOSITION OF SPECIMEN:  PATHOLOGY  COUNTS:  YES  TOURNIQUET:  * No tourniquets in log *  DICTATION: .Dragon Dictation  After informed consent was obtained the patient was brought to the operating room and placed in the supine position on the operating table.  After adequate induction of general anesthesia the patient's left chest, breast, and axillary area were prepped with ChloraPrep, allowed to dry, and draped in usual sterile manner.  An appropriate timeout was performed.  Previously an I-125 seed was placed in the upper central left breast to mark an area of ductal carcinoma in situ.  Also earlier in the day the patient underwent injection of 1 mCi of technetium sulfur colloid in the subareolar position on the left.  The neoprobe was set to technetium and there was a good signal identified in the left axilla.  The area overlying this was infiltrated with quarter percent Marcaine.  A small transversely oriented incision was made with a 15 blade knife overlying the area of radioactivity.  The incision was carried through the skin and subcutaneous tissue sharply with the electrocautery until the deep left axillary space was entered.  Using the neoprobe to direct blunt dissection I was able to identify 3 hot lymph nodes.  These were excised  sharply with the electrocautery and the surrounding small vessels and lymphatics were controlled with clips.  Ex vivo counts on these nodes ranged from 300 to 3000.  No other hot or palpable nodes were identified in the left axilla.  Hemostasis was achieved using the Bovie electrocautery.  The deep layer of the incision was closed with interrupted 3-0 Vicryl stitches.  The skin was closed with a running 4-0 Monocryl subcuticular stitch.  Attention was then turned to the left breast.  The neoprobe was set to I-125 in the area of radioactivity was readily identified in the upper subareolar left breast.  The area around this was infiltrated with quarter percent Marcaine.  A curvilinear incision was made along the upper edge of the areola with a 15 blade knife.  The incision was carried through the skin and subcutaneous tissue sharply with the electrocautery.  The radioactive seed was fairly shallow to this incision.  The dissection was carried shallow behind the nipple and areola complex and then a circular portion of breast tissue was excised sharply with the electrocautery around the radioactive seed while checking the area of radioactivity frequently.  This dissection was carried all the way to the muscle of the chest wall.  Once the specimen was removed it was oriented with the appropriate paint colors.  A specimen radiograph was obtained that showed the clip and seed to be near the center of the specimen.  The specimen was then sent to pathology for further evaluation.  Hemostasis was achieved  using the Bovie electrocautery.  The wound was irrigated with saline and infiltrated with more quarter percent Marcaine.  The deep layer of the wound was then closed with layers of interrupted 3-0 Vicryl stitches.  The skin was then closed with interrupted 4-0 Monocryl subcuticular stitches.  Dermabond dressings were applied.  The patient tolerated the procedure well.  At the end of the case all needle sponge and instrument  counts were correct.  The patient was then awakened and taken recovery in stable condition.  PLAN OF CARE: Discharge to home after PACU  PATIENT DISPOSITION:  PACU - hemodynamically stable.   Delay start of Pharmacological VTE agent (>24hrs) due to surgical blood loss or risk of bleeding: not applicable

## 2021-02-25 NOTE — Anesthesia Preprocedure Evaluation (Addendum)
Anesthesia Evaluation  Patient identified by MRN, date of birth, ID band Patient awake    Reviewed: Allergy & Precautions, H&P , NPO status , Patient's Chart, lab work & pertinent test results  Airway Mallampati: II   Neck ROM: full    Dental   Pulmonary neg pulmonary ROS,    breath sounds clear to auscultation       Cardiovascular + Valvular Problems/Murmurs MVP  Rhythm:regular Rate:Normal     Neuro/Psych PSYCHIATRIC DISORDERS Depression    GI/Hepatic   Endo/Other    Renal/GU      Musculoskeletal   Abdominal   Peds  Hematology   Anesthesia Other Findings   Reproductive/Obstetrics Breast CA                             Anesthesia Physical Anesthesia Plan  ASA: 2  Anesthesia Plan: General   Post-op Pain Management:  Regional for Post-op pain   Induction: Intravenous  PONV Risk Score and Plan: 3 and Ondansetron, Dexamethasone, Treatment may vary due to age or medical condition and Midazolam  Airway Management Planned: LMA  Additional Equipment:   Intra-op Plan:   Post-operative Plan: Extubation in OR  Informed Consent: I have reviewed the patients History and Physical, chart, labs and discussed the procedure including the risks, benefits and alternatives for the proposed anesthesia with the patient or authorized representative who has indicated his/her understanding and acceptance.     Dental advisory given  Plan Discussed with: CRNA, Anesthesiologist and Surgeon  Anesthesia Plan Comments:         Anesthesia Quick Evaluation

## 2021-02-25 NOTE — Transfer of Care (Signed)
Immediate Anesthesia Transfer of Care Note  Patient: Kelli Saunders  Procedure(s) Performed: LEFT BREAST LUMPECTOMY WITH RADIOACTIVE SEED AND SENTINEL LYMPH NODE BIOPSY (Left: Breast)  Patient Location: PACU  Anesthesia Type:General  Level of Consciousness: drowsy, patient cooperative and responds to stimulation  Airway & Oxygen Therapy: Patient Spontanous Breathing and Patient connected to face mask oxygen  Post-op Assessment: Report given to RN and Post -op Vital signs reviewed and stable  Post vital signs: Reviewed and stable  Last Vitals:  Vitals Value Taken Time  BP    Temp    Pulse 73 02/25/21 0953  Resp    SpO2 94 % 02/25/21 0953  Vitals shown include unvalidated device data.  Last Pain:  Vitals:   02/25/21 0645  TempSrc: Oral  PainSc: 0-No pain      Patients Stated Pain Goal: 3 ([id number])  Complications: No notable events documented.

## 2021-02-26 ENCOUNTER — Encounter (HOSPITAL_BASED_OUTPATIENT_CLINIC_OR_DEPARTMENT_OTHER): Payer: Self-pay | Admitting: General Surgery

## 2021-03-02 LAB — SURGICAL PATHOLOGY

## 2021-03-03 ENCOUNTER — Encounter: Payer: Self-pay | Admitting: *Deleted

## 2021-03-13 ENCOUNTER — Ambulatory Visit: Payer: Self-pay | Admitting: General Surgery

## 2021-03-17 ENCOUNTER — Encounter (HOSPITAL_COMMUNITY): Payer: Self-pay | Admitting: General Surgery

## 2021-03-17 ENCOUNTER — Other Ambulatory Visit: Payer: Self-pay

## 2021-03-17 NOTE — Progress Notes (Addendum)
Kelli Saunders denies chest pain or shortness of breath. Patient denies having any s/s of Covid in her household.  Patient denies any known exposure to Covid.   PCP is Dr. Marco Collie. Cardiologist is Dr. Bettina Gavia. I instructed patient to shower with antibiotic soap, if it is available.  Dry off with a clean towel. Do not put lotion, powder, cologne or deodorant or makeup.No jewelry or piercings. Men may shave their face and neck. Woman should not shave. No nail polish, artificial or acrylic nails. Wear clean clothes, brush your teeth. Glasses, contact lens,dentures or partials may not be worn in the OR. If you need to wear them, please bring a case for glasses, do not wear contacts or bring a case, the hospital does not have contact cases, dentures or partials will have to be removed , make sure they are clean, we will provide a denture cup to put them in. You will need some one to drive you home and a responsible person over the age of 57 to stay with you for the first 24 hours after surgery.

## 2021-03-19 ENCOUNTER — Encounter (HOSPITAL_COMMUNITY): Payer: Self-pay | Admitting: General Surgery

## 2021-03-19 ENCOUNTER — Encounter (HOSPITAL_COMMUNITY)
Admission: RE | Disposition: A | Discharge status: Home / Self Care | Payer: Self-pay | Source: Home / Self Care | Attending: General Surgery

## 2021-03-19 ENCOUNTER — Other Ambulatory Visit: Payer: Self-pay

## 2021-03-19 ENCOUNTER — Ambulatory Visit (HOSPITAL_COMMUNITY): Payer: Medicare PPO | Admitting: Anesthesiology

## 2021-03-19 ENCOUNTER — Ambulatory Visit (HOSPITAL_COMMUNITY)
Admission: RE | Admit: 2021-03-19 | Discharge: 2021-03-19 | Disposition: A | Discharge status: Home / Self Care | Payer: Medicare PPO | Attending: General Surgery | Admitting: General Surgery

## 2021-03-19 DIAGNOSIS — D0512 Intraductal carcinoma in situ of left breast: Secondary | ICD-10-CM | POA: Diagnosis not present

## 2021-03-19 DIAGNOSIS — L7634 Postprocedural seroma of skin and subcutaneous tissue following other procedure: Secondary | ICD-10-CM | POA: Diagnosis not present

## 2021-03-19 DIAGNOSIS — M96843 Postprocedural seroma of a musculoskeletal structure following other procedure: Secondary | ICD-10-CM | POA: Diagnosis not present

## 2021-03-19 DIAGNOSIS — F32A Depression, unspecified: Secondary | ICD-10-CM | POA: Diagnosis not present

## 2021-03-19 DIAGNOSIS — M8580 Other specified disorders of bone density and structure, unspecified site: Secondary | ICD-10-CM | POA: Diagnosis not present

## 2021-03-19 DIAGNOSIS — Z9889 Other specified postprocedural states: Secondary | ICD-10-CM | POA: Diagnosis not present

## 2021-03-19 DIAGNOSIS — E785 Hyperlipidemia, unspecified: Secondary | ICD-10-CM | POA: Diagnosis not present

## 2021-03-19 HISTORY — PX: DRAINAGE AND CLOSURE OF LYMPHOCELE: SHX5800

## 2021-03-19 SURGERY — DRAINAGE AND CLOSURE OF LYMPHOCELE
Anesthesia: Monitor Anesthesia Care | Site: Axilla | Laterality: Left

## 2021-03-19 MED ORDER — ONDANSETRON HCL 4 MG/2ML IJ SOLN
INTRAMUSCULAR | Status: DC | PRN
  Administered 2021-03-19: 4 mg via INTRAVENOUS

## 2021-03-19 MED ORDER — LIDOCAINE HCL (PF) 1 % IJ SOLN
INTRAMUSCULAR | Status: AC
  Filled 2021-03-19: qty 30

## 2021-03-19 MED ORDER — MIDAZOLAM HCL 2 MG/2ML IJ SOLN
INTRAMUSCULAR | Status: AC
  Filled 2021-03-19: qty 2

## 2021-03-19 MED ORDER — PROPOFOL 500 MG/50ML IV EMUL
INTRAVENOUS | Status: DC | PRN
  Administered 2021-03-19: 100 ug/kg/min via INTRAVENOUS

## 2021-03-19 MED ORDER — CHLORHEXIDINE GLUCONATE 0.12 % MT SOLN
15.0000 mL | Freq: Once | OROMUCOSAL | Status: AC
  Administered 2021-03-19: 15 mL via OROMUCOSAL
  Filled 2021-03-19: qty 15

## 2021-03-19 MED ORDER — ACETAMINOPHEN 160 MG/5ML PO SOLN: 325.0000 mg | ORAL | Status: DC | PRN

## 2021-03-19 MED ORDER — FENTANYL CITRATE (PF) 250 MCG/5ML IJ SOLN
INTRAMUSCULAR | Status: AC
  Filled 2021-03-19: qty 5

## 2021-03-19 MED ORDER — PROPOFOL 10 MG/ML IV BOLUS
INTRAVENOUS | Status: AC
  Filled 2021-03-19: qty 40

## 2021-03-19 MED ORDER — FENTANYL CITRATE (PF) 250 MCG/5ML IJ SOLN
INTRAMUSCULAR | Status: DC | PRN
  Administered 2021-03-19 (×2): 25 ug via INTRAVENOUS
  Administered 2021-03-19: 50 ug via INTRAVENOUS

## 2021-03-19 MED ORDER — CHLORHEXIDINE GLUCONATE CLOTH 2 % EX PADS: 6.0000 | MEDICATED_PAD | Freq: Once | CUTANEOUS | Status: DC

## 2021-03-19 MED ORDER — LIDOCAINE 2% (20 MG/ML) 5 ML SYRINGE
INTRAMUSCULAR | Status: DC | PRN
  Administered 2021-03-19: 40 mg via INTRAVENOUS

## 2021-03-19 MED ORDER — MIDAZOLAM HCL 2 MG/2ML IJ SOLN
INTRAMUSCULAR | Status: DC | PRN
  Administered 2021-03-19: 2 mg via INTRAVENOUS

## 2021-03-19 MED ORDER — LACTATED RINGERS IV SOLN: INTRAVENOUS | Status: DC

## 2021-03-19 MED ORDER — ACETAMINOPHEN 325 MG PO TABS: 325.0000 mg | ORAL_TABLET | ORAL | Status: DC | PRN

## 2021-03-19 MED ORDER — FENTANYL CITRATE (PF) 100 MCG/2ML IJ SOLN: 25.0000 ug | INTRAMUSCULAR | Status: DC | PRN

## 2021-03-19 MED ORDER — PROMETHAZINE HCL 25 MG/ML IJ SOLN: 6.2500 mg | INTRAMUSCULAR | Status: DC | PRN

## 2021-03-19 MED ORDER — OXYCODONE HCL 5 MG PO TABS: 5.0000 mg | ORAL_TABLET | Freq: Once | ORAL | Status: DC | PRN

## 2021-03-19 MED ORDER — GABAPENTIN 300 MG PO CAPS
300.0000 mg | ORAL_CAPSULE | ORAL | Status: AC
  Administered 2021-03-19: 300 mg via ORAL
  Filled 2021-03-19: qty 1

## 2021-03-19 MED ORDER — LIDOCAINE HCL 1 % IJ SOLN
INTRAMUSCULAR | Status: DC | PRN
  Administered 2021-03-19: 10 mL

## 2021-03-19 MED ORDER — PROPOFOL 10 MG/ML IV BOLUS
INTRAVENOUS | Status: DC | PRN
  Administered 2021-03-19: 20 mg via INTRAVENOUS
  Administered 2021-03-19: 30 mg via INTRAVENOUS

## 2021-03-19 MED ORDER — HYDROCODONE-ACETAMINOPHEN 5-325 MG PO TABS: 1.0000 | ORAL_TABLET | Freq: Four times a day (QID) | ORAL | 0 refills | Status: DC | PRN

## 2021-03-19 MED ORDER — ACETAMINOPHEN 10 MG/ML IV SOLN: 1000.0000 mg | Freq: Once | INTRAVENOUS | Status: DC | PRN

## 2021-03-19 MED ORDER — OXYCODONE HCL 5 MG/5ML PO SOLN
5.0000 mg | Freq: Once | ORAL | Status: DC | PRN
Start: 2021-03-19 — End: 2021-03-19

## 2021-03-19 MED ORDER — BUPIVACAINE-EPINEPHRINE (PF) 0.25% -1:200000 IJ SOLN
INTRAMUSCULAR | Status: AC
  Filled 2021-03-19: qty 30

## 2021-03-19 MED ORDER — ORAL CARE MOUTH RINSE: 15.0000 mL | Freq: Once | OROMUCOSAL | Status: AC

## 2021-03-19 MED ORDER — AMISULPRIDE (ANTIEMETIC) 5 MG/2ML IV SOLN: 10.0000 mg | Freq: Once | INTRAVENOUS | Status: DC | PRN

## 2021-03-19 MED ORDER — ACETAMINOPHEN 500 MG PO TABS
1000.0000 mg | ORAL_TABLET | ORAL | Status: AC
  Administered 2021-03-19: 1000 mg via ORAL
  Filled 2021-03-19: qty 2

## 2021-03-19 SURGICAL SUPPLY — 33 items
BAG COUNTER SPONGE SURGICOUNT (BAG) ×2 IMPLANT
BIOPATCH BLUE 3/4IN DISK W/1.5 (GAUZE/BANDAGES/DRESSINGS) ×2 IMPLANT
CANISTER SUCT 3000ML PPV (MISCELLANEOUS) IMPLANT
CHLORAPREP W/TINT 26 (MISCELLANEOUS) ×2 IMPLANT
COVER SURGICAL LIGHT HANDLE (MISCELLANEOUS) ×2 IMPLANT
DECANTER SPIKE VIAL GLASS SM (MISCELLANEOUS) IMPLANT
DERMABOND ADVANCED (GAUZE/BANDAGES/DRESSINGS) ×1
DERMABOND ADVANCED .7 DNX12 (GAUZE/BANDAGES/DRESSINGS) ×1 IMPLANT
DRAIN CHANNEL 19F RND (DRAIN) ×2 IMPLANT
DRAPE LAPAROTOMY 100X72 PEDS (DRAPES) ×2 IMPLANT
DRSG TEGADERM 4X4.5 CHG (GAUZE/BANDAGES/DRESSINGS) ×2 IMPLANT
ELECT COATED BLADE 2.86 ST (ELECTRODE) ×2 IMPLANT
ELECT REM PT RETURN 9FT ADLT (ELECTROSURGICAL) ×2
ELECTRODE REM PT RTRN 9FT ADLT (ELECTROSURGICAL) ×1 IMPLANT
EVACUATOR SILICONE 100CC (DRAIN) ×2 IMPLANT
GAUZE SPONGE 4X4 12PLY STRL (GAUZE/BANDAGES/DRESSINGS) IMPLANT
GLOVE SURG ENC MOIS LTX SZ7.5 (GLOVE) ×2 IMPLANT
GOWN STRL REUS W/ TWL LRG LVL3 (GOWN DISPOSABLE) ×2 IMPLANT
GOWN STRL REUS W/TWL LRG LVL3 (GOWN DISPOSABLE) ×4
KIT BASIN OR (CUSTOM PROCEDURE TRAY) ×2 IMPLANT
KIT TURNOVER KIT B (KITS) ×2 IMPLANT
NEEDLE HYPO 25GX1X1/2 BEV (NEEDLE) ×2 IMPLANT
NS IRRIG 1000ML POUR BTL (IV SOLUTION) ×2 IMPLANT
PACK GENERAL/GYN (CUSTOM PROCEDURE TRAY) ×2 IMPLANT
PAD ARMBOARD 7.5X6 YLW CONV (MISCELLANEOUS) ×2 IMPLANT
PENCIL SMOKE EVACUATOR (MISCELLANEOUS) ×2 IMPLANT
SUT ETHILON 3 0 PS 1 (SUTURE) ×2 IMPLANT
SUT MNCRL AB 4-0 PS2 18 (SUTURE) ×2 IMPLANT
SUT VIC AB 3-0 SH 27 (SUTURE) ×2
SUT VIC AB 3-0 SH 27XBRD (SUTURE) ×1 IMPLANT
SYR CONTROL 10ML LL (SYRINGE) ×2 IMPLANT
TOWEL GREEN STERILE (TOWEL DISPOSABLE) IMPLANT
TOWEL GREEN STERILE FF (TOWEL DISPOSABLE) ×2 IMPLANT

## 2021-03-19 NOTE — Anesthesia Postprocedure Evaluation (Signed)
Anesthesia Post Note  Patient: Kelli Saunders  Procedure(s) Performed: Placement drain left axillary seroma (Left: Axilla)     Patient location during evaluation: PACU Anesthesia Type: MAC Level of consciousness: awake and alert Pain management: pain level controlled Vital Signs Assessment: post-procedure vital signs reviewed and stable Respiratory status: spontaneous breathing, nonlabored ventilation, respiratory function stable and patient connected to nasal cannula oxygen Cardiovascular status: stable and blood pressure returned to baseline Postop Assessment: no apparent nausea or vomiting Anesthetic complications: no   No notable events documented.  Last Vitals:  Vitals:   03/19/21 1037 03/19/21 1052  BP: 102/66 125/79  Pulse: (!) 49 (!) 52  Resp: 16 15  Temp:  36.8 C  SpO2: 96% 99%    Last Pain:  Vitals:   03/19/21 1052  TempSrc:   PainSc: 0-No pain                 Effie Berkshire

## 2021-03-19 NOTE — Anesthesia Preprocedure Evaluation (Addendum)
Anesthesia Evaluation  Patient identified by MRN, date of birth, ID band Patient awake    Reviewed: Allergy & Precautions, NPO status , Patient's Chart, lab work & pertinent test results  Airway Mallampati: I  TM Distance: >3 FB Neck ROM: Full    Dental  (+) Teeth Intact   Pulmonary neg pulmonary ROS,    breath sounds clear to auscultation       Cardiovascular + Valvular Problems/Murmurs MVP  Rhythm:Regular Rate:Normal     Neuro/Psych PSYCHIATRIC DISORDERS Depression negative neurological ROS     GI/Hepatic negative GI ROS, Neg liver ROS,   Endo/Other  negative endocrine ROS  Renal/GU negative Renal ROS     Musculoskeletal negative musculoskeletal ROS (+)   Abdominal Normal abdominal exam  (+)   Peds  Hematology negative hematology ROS (+)   Anesthesia Other Findings   Reproductive/Obstetrics                            Anesthesia Physical Anesthesia Plan  ASA: 2  Anesthesia Plan: MAC   Post-op Pain Management:    Induction: Intravenous  PONV Risk Score and Plan: 3 and Propofol infusion, Ondansetron and Midazolam  Airway Management Planned: Natural Airway and Simple Face Mask  Additional Equipment: None  Intra-op Plan:   Post-operative Plan:   Informed Consent: I have reviewed the patients History and Physical, chart, labs and discussed the procedure including the risks, benefits and alternatives for the proposed anesthesia with the patient or authorized representative who has indicated his/her understanding and acceptance.     Dental advisory given  Plan Discussed with: CRNA  Anesthesia Plan Comments:        Anesthesia Quick Evaluation

## 2021-03-19 NOTE — Interval H&P Note (Signed)
History and Physical Interval Note:  03/19/2021 9:32 AM  Kelli Saunders  has presented today for surgery, with the diagnosis of left axillary seroma.  The various methods of treatment have been discussed with the patient and family. After consideration of risks, benefits and other options for treatment, the patient has consented to  Procedure(s): placement drain left axillary seroma (Left) as a surgical intervention.  The patient's history has been reviewed, patient examined, no change in status, stable for surgery.  I have reviewed the patient's chart and labs.  Questions were answered to the patient's satisfaction.     Autumn Messing III

## 2021-03-19 NOTE — H&P (Signed)
PROVIDER: Landry Corporal, MD  MRN: [id number] DOB: August 30, 1950 Subjective   Chief Complaint: No chief complaint on file.   History of Present Illness: Kelli Saunders is a 70 y.o. female who is seen today for left breast DCIS. The patient is a 70 year old white female who is about 2 weeks status post left breast lumpectomy and sentinel node biopsy for DCIS that was ER and PR positive. She tolerated the surgery well but does complain of some swelling and discomfort in her left axilla. She denies any fevers or chills.  Review of Systems: A complete review of systems was obtained from the patient. I have reviewed this information and discussed as appropriate with the patient. See HPI as well for other ROS.  ROS   Medical History: Past Medical History:  Diagnosis Date   Arrhythmia   Depression   History of cancer   Mitral valve prolapse   Osteopenia   Patient Active Problem List  Diagnosis   Ductal carcinoma in situ (DCIS) of left breast   Hyperlipidemia   Left atrial enlargement   Mitral valve prolapse   Osteopenia   Palpitation   Pectus excavatum   Thoracic scoliosis   Wrist fracture, right   Family history of long QT syndrome   Depression   Past Surgical History:  Procedure Laterality Date   ABDOMINAL HYSTERECTOMY   CERVICAL DISC ARTHROPLASTY; ANTERIOR APPROACH N/A 2007    Allergies  Allergen Reactions   Sulfa (Sulfonamide Antibiotics) Itching   Current Outpatient Medications on File Prior to Visit  Medication Sig Dispense Refill   loratadine (CLARITIN) 10 mg tablet Take 10 mg by mouth once daily   metoprolol succinate (TOPROL-XL) 25 MG XL tablet   No current facility-administered medications on file prior to visit.   Family History  Problem Relation Age of Onset   High blood pressure (Hypertension) Mother   Hyperlipidemia (Elevated cholesterol) Mother   Stroke Father   Hyperlipidemia (Elevated cholesterol) Father   High blood pressure (Hypertension)  Father   Obesity Brother    Social History   Tobacco Use  Smoking Status Never Smoker  Smokeless Tobacco Never Used    Social History   Socioeconomic History   Marital status: Married  Tobacco Use   Smoking status: Never Smoker   Smokeless tobacco: Never Used  Substance and Sexual Activity   Alcohol use: Yes  Alcohol/week: 4.0 standard drinks  Types: 4 Standard drinks or equivalent per week   Drug use: Defer   Sexual activity: Defer   Objective:   There were no vitals filed for this visit.  There is no height or weight on file to calculate BMI.  Physical Exam   Breast: The left breast periareolar incision is healing nicely with no sign of infection or seroma. The left axillary incision does have a significant seroma. This area was prepped with ChloraPrep and infiltrated with 1% lidocaine. I was able to aspirate 110 cc of clear yellow fluid and she tolerated this well.  Labs, Imaging and Diagnostic Testing:  Assessment and Plan:  Diagnoses and all orders for this visit:  Ductal carcinoma in situ (DCIS) of left breast    The patient is about 2 weeks status post left breast lumpectomy for DCIS. Her postoperative course is complicated by a seroma in the left axilla. This was able to be aspirated today without difficulty. I will plan to place a drain in the seroma next week in the operating room. I have discussed with her in detail the risks  and benefits of the operation as well as some of the technical aspects and she understands and wishes to proceed she will keep some compression on there for the next couple days. She will try Lamisil cream to skin of left axilla twice a day

## 2021-03-19 NOTE — Anesthesia Procedure Notes (Signed)
Procedure Name: MAC Date/Time: 03/19/2021 9:42 AM Performed by: Janace Litten, CRNA Pre-anesthesia Checklist: Patient identified, Emergency Drugs available, Suction available and Patient being monitored Patient Re-evaluated:Patient Re-evaluated prior to induction Oxygen Delivery Method: Simple face mask

## 2021-03-19 NOTE — Op Note (Signed)
03/19/2021  10:21 AM  PATIENT:  Kelli Saunders  70 y.o. female  PRE-OPERATIVE DIAGNOSIS:  Left axillary seroma  POST-OPERATIVE DIAGNOSIS:  Left axillary seroma  PROCEDURE:  Procedure(s): Placement drain left axillary seroma (Left)  SURGEON:  Surgeon(s) and Role:    Jovita Kussmaul, MD - Primary  PHYSICIAN ASSISTANT:   ASSISTANTS: none   ANESTHESIA:   local and IV sedation  EBL:  minimal   BLOOD ADMINISTERED:none  DRAINS: (1) Blake drain(s) in the left axilla    LOCAL MEDICATIONS USED:  MARCAINE    and LIDOCAINE   SPECIMEN:  No Specimen  DISPOSITION OF SPECIMEN:  N/A  COUNTS:  YES  TOURNIQUET:  * No tourniquets in log *  DICTATION: .Dragon Dictation  After informed consent was obtained the patient was brought to the operating room and placed in the supine position on the operating table.  After adequate IV sedation had been given the patient's left axilla was prepped with ChloraPrep, allowed to dry, and draped in usual sterile manner.  A site was chosen below the seroma for placement of the drain site.  This area was infiltrated with 1% lidocaine and quarter percent Marcaine until a good field block was created.  A small transversely oriented incision was made with a 15 blade knife in the axilla.  Blunt hemostat dissection was carried out from the incision to the seroma cavity until the seroma cavity was entered.  A 19 French round Blake drain was then placed along this tract into the seroma cavity without difficulty.  The drain was anchored to the skin with a 3-0 nylon stitch.  Sterile dressings were applied.  The patient tolerated the procedure well.  All needle sponge and instrument counts were correct.  The patient was then awakened and taken to recovery in stable condition.  PLAN OF CARE: Discharge to home after PACU  PATIENT DISPOSITION:  PACU - hemodynamically stable.   Delay start of Pharmacological VTE agent (>24hrs) due to surgical blood loss or risk of  bleeding: not applicable

## 2021-03-19 NOTE — Transfer of Care (Signed)
Immediate Anesthesia Transfer of Care Note  Patient: Kelli Saunders  Procedure(s) Performed: Placement drain left axillary seroma (Left: Axilla)  Patient Location: PACU  Anesthesia Type:MAC  Level of Consciousness: drowsy, patient cooperative and responds to stimulation  Airway & Oxygen Therapy: Patient Spontanous Breathing  Post-op Assessment: Report given to RN and Post -op Vital signs reviewed and stable  Post vital signs: Reviewed and stable  Last Vitals:  Vitals Value Taken Time  BP 103/63 03/19/21 1022  Temp    Pulse 50 03/19/21 1022  Resp 12 03/19/21 1022  SpO2 98 % 03/19/21 1022  Vitals shown include unvalidated device data.  Last Pain:  Vitals:   03/19/21 0827  TempSrc:   PainSc: 0-No pain         Complications: No notable events documented.

## 2021-03-20 ENCOUNTER — Encounter (HOSPITAL_COMMUNITY): Payer: Self-pay | Admitting: General Surgery

## 2021-03-23 ENCOUNTER — Ambulatory Visit: Payer: Medicare PPO | Admitting: Radiation Oncology

## 2021-03-23 ENCOUNTER — Ambulatory Visit
Admission: RE | Admit: 2021-03-23 | Discharge: 2021-03-23 | Disposition: A | Discharge status: Home / Self Care | Payer: Medicare PPO | Source: Ambulatory Visit | Attending: Radiation Oncology | Admitting: Radiation Oncology

## 2021-03-23 ENCOUNTER — Ambulatory Visit: Payer: Medicare PPO

## 2021-03-30 ENCOUNTER — Encounter: Payer: Self-pay | Admitting: *Deleted

## 2021-04-03 ENCOUNTER — Other Ambulatory Visit: Payer: Self-pay

## 2021-04-03 ENCOUNTER — Ambulatory Visit: Payer: Medicare PPO | Admitting: Sports Medicine

## 2021-04-03 DIAGNOSIS — B351 Tinea unguium: Secondary | ICD-10-CM | POA: Diagnosis not present

## 2021-04-03 NOTE — Progress Notes (Signed)
Radiation Oncology         [phone] ________________________________  Name: Kelli Saunders MRN: [id number]  Date: 04/06/2021  DOB: [birth year]  Re-Evaluation Note  CC: Marco Collie, MD  Truitt Merle, MD    ICD-10-CM   1. Ductal carcinoma in situ (DCIS) of left breast  D05.12       Diagnosis:   Stage 0 (cTis (DCIS), cN0, cM0) Left Breast, Ductal carcinoma in-situ with a focus of microinvasive carcinoma, ER+ / PR+  Narrative:  The patient returns today to discuss radiation treatment options. She was seen in the multidisciplinary breast clinic on 01/07/21.   She opted to proceed with left breast lumpectomy and nodal biopsies on 02/25/21 under the care of Dr. Marlou Starks. Pathology from the procedure revealed: high-grade ductal carcinoma in situ, measuring 1.5 cm, with a focus suspicious for microinvasive carcinoma. Resection margins negative for carcinoma. Nodal status of 7/7 left sentinel lymph node excisions negative for carcinoma. Prognostic indicators significant for: ER and PR both >95% positive, and both with strong staining intensity.   The patient developed a left axillary seroma and underwent drain placement for the seroma on 03/19/21 by Dr. Marlou Starks.   On review of systems, the patient reports some soreness in the left axillary area.  Her swelling in this area has decreased after placement of drainage tube.  This is been subsequently removed now. She denies swelling in her left arm and her hand.  She denies any significant pain within the left breast area.    Allergies:  is allergic to sulfa antibiotics.  Meds: Current Outpatient Medications  Medication Sig Dispense Refill   acetaminophen (TYLENOL) 650 MG CR tablet Take 650-1,300 mg by mouth every 8 (eight) hours as needed for pain.     bimatoprost (LATISSE) 0.03 % ophthalmic solution Apply 1 application to eye daily. Uses on eye brows, not in eye     loratadine (CLARITIN) 10 MG tablet Take 10 mg by mouth daily as needed for  allergies.     metoprolol succinate (TOPROL XL) 25 MG 24 hr tablet Take 0.5 tablets (12.5 mg total) by mouth 3 (three) times a week. (Patient taking differently: Take 12.5 mg by mouth every Monday, Wednesday, and Friday.) 20 tablet 3   Multiple Vitamin (MULTIVITAMIN) tablet Take 1 tablet by mouth daily.     Probiotic Product (FORTIFY PROBIOTIC WOMENS EX ST PO) Take 1 tablet by mouth daily.     Calcium Carb-Cholecalciferol (CALCIUM 600 + D PO) Take 1 tablet by mouth daily. (Patient not taking: Reported on 04/06/2021)     Coenzyme Q10 (CO Q 10 PO) Take 1 tablet by mouth daily. (Patient not taking: Reported on 04/06/2021)     HYDROcodone-acetaminophen (NORCO/VICODIN) 5-325 MG tablet Take 1 tablet by mouth every 6 (six) hours as needed for moderate pain or severe pain. (Patient not taking: Reported on 04/06/2021) 10 tablet 0   Omega-3 Fatty Acids (FISH OIL PO) Take 1 tablet by mouth daily.  (Patient not taking: Reported on 04/06/2021)     UNABLE TO FIND Place 1 capsule into both eyes daily. HydroEye Softgels Dry Eye Relief ( proprietary blend of GLA and omega 3 fatty acids (EPA and DHA) (Patient not taking: Reported on 04/06/2021)     VITAMIN D PO Take 5,000 Units by mouth daily. (Patient not taking: Reported on 04/06/2021)     No current facility-administered medications for this encounter.    Physical Findings: The patient is in no acute distress. Patient is alert and oriented.  Accompanied by husband on evaluation today.  height is 5\' 8"  (1.727 m) and weight is 140 lb 2 oz (63.6 kg). Her temporal temperature is 96.2 F (35.7 C) (abnormal). Her blood pressure is 118/76 and her pulse is 64. Her respiration is 18 and oxygen saturation is 100%.  No significant changes. Lungs are clear to auscultation bilaterally. Heart has regular rate and rhythm. No palpable cervical, supraclavicular, or axillary adenopathy. Abdomen soft, non-tender, normal bowel sounds.  Left breast: Well-healing lumpectomy scar.   Patient has a separate scar in the axillary region from her sentinel node procedure.  This showed some swelling but no signs of infection or drainage.  No dominant mass appreciated in the breast nipple discharge or bleeding.  Lab Findings: Lab Results  Component Value Date   WBC 6.4 01/07/2021   HGB 14.8 01/07/2021   HCT 43.6 01/07/2021   MCV 91.0 01/07/2021   PLT 216 01/07/2021    Radiographic Findings: No results found.  Impression:  Stage 0 (cTis (DCIS), cN0, cM0) Left Breast, Ductal carcinoma in-situ with a focus of microinvasive carcinoma, ER+ / PR+  The patient would be a good candidate for adjuvant radiation therapy directed to the left breast as breast conserving therapy.  I discussed the general course of radiation therapy anticipated side effects and potential toxicities of left breast radiation therapy in detail with the patient and her husband.  She appears to understand and wishes to proceed with planned course of treatment.  Plan:  Patient is scheduled for CT simulation October 31 at 11:30 AM.  Treatments began approximately a week later.  Anticipate 4 weeks of radiation therapy as hypofractionated accelerated radiation therapy.  -----------------------------------  Blair Promise, PhD, MD  This document serves as a record of services personally performed by Gery Pray, MD. It was created on his behalf by Roney Mans, a trained medical scribe. The creation of this record is based on the scribe's personal observations and the provider's statements to them. This document has been checked and approved by the attending provider.

## 2021-04-03 NOTE — Progress Notes (Signed)
Subjective:  Kelli Saunders is a 70 y.o. female patient who returns to office for follow-up evaluation of nail fungus at the right great toe.  Patient reports that things remain about the same still using her topical but has not noticed any significant resolution of the fungus.  Patient denies any other pedal complaints at this time.  Patient reports that she is recovering after a lumpectomy and have to have her drain removed on today.  Patient Active Problem List   Diagnosis Date Noted   Ductal carcinoma in situ (DCIS) of left breast 01/05/2021   Mitral valve prolapse 12/01/2017   Depression 12/01/2017   Hyperlipidemia 12/01/2017   Family history of long QT syndrome 12/01/2017   Osteopenia 12/01/2017   Pectus excavatum 12/01/2017   Thoracic scoliosis 12/01/2017   Left atrial enlargement 12/27/2014   Palpitation 12/26/2014   Wrist fracture, right 07/2013    Current Outpatient Medications on File Prior to Visit  Medication Sig Dispense Refill   acetaminophen (TYLENOL) 650 MG CR tablet Take 650-1,300 mg by mouth every 8 (eight) hours as needed for pain.     bimatoprost (LATISSE) 0.03 % ophthalmic solution Apply 1 application to eye daily. Uses on eye brows, not in eye     Calcium Carb-Cholecalciferol (CALCIUM 600 + D PO) Take 1 tablet by mouth daily.     Coenzyme Q10 (CO Q 10 PO) Take 1 tablet by mouth daily.     HYDROcodone-acetaminophen (NORCO/VICODIN) 5-325 MG tablet Take 1 tablet by mouth every 6 (six) hours as needed for moderate pain or severe pain. 10 tablet 0   loratadine (CLARITIN) 10 MG tablet Take 10 mg by mouth daily as needed for allergies.     metoprolol succinate (TOPROL XL) 25 MG 24 hr tablet Take 0.5 tablets (12.5 mg total) by mouth 3 (three) times a week. (Patient taking differently: Take 12.5 mg by mouth every Monday, Wednesday, and Friday.) 20 tablet 3   Omega-3 Fatty Acids (FISH OIL PO) Take 1 tablet by mouth daily.      Probiotic Product (FORTIFY PROBIOTIC  WOMENS EX ST PO) Take 1 tablet by mouth daily.     UNABLE TO FIND Place 1 capsule into both eyes daily. HydroEye Softgels Dry Eye Relief ( proprietary blend of GLA and omega 3 fatty acids (EPA and DHA)     VITAMIN D PO Take 5,000 Units by mouth daily.     No current facility-administered medications on file prior to visit.    Allergies  Allergen Reactions   Sulfa Antibiotics Itching    Objective:  General: Alert and oriented x3 in no acute distress  Dermatology: No open lesions bilateral lower extremities, no webspace macerations, no ecchymosis bilateral, right first toenail distal lifting and discoloration and a small section of the nail encompassing the same amount of the nail about 30% at the distal and medial aspect of the nail fold  Vascular: Dorsalis Pedis and Posterior Tibial pedal pulses palpable, Capillary Fill Time 3 seconds,(+) pedal hair growth bilateral, no edema bilateral lower extremities, Temperature gradient within normal limits.  Neurology: Gross sensation intact via light touch bilateral.   Musculoskeletal: No tenderness to palpation bilateral.  Bunion deformity noted bilateral.  Strength within normal limits in all groups bilateral.    Assessment and Plan: Problem List Items Addressed This Visit   None Visit Diagnoses     Nail fungus    -  Primary       -Complete examination performed -Discussed treatment options for  nail fungus at right first toe -Mechanically debrided right great toenail with sterile nail nipper without incident -Continue with topical from [place] to use as instructed -Advised patient if her nail continues to grow this way may benefit from a nail avulsion procedure however elects to continue conservative care again at this visit and wants to wait to consider this sometimes later in the spring -Patient to return to office when ready for procedure or sooner if condition worsens.  Landis Martins, DPM

## 2021-04-06 ENCOUNTER — Other Ambulatory Visit: Payer: Self-pay

## 2021-04-06 ENCOUNTER — Ambulatory Visit
Admission: RE | Admit: 2021-04-06 | Discharge: 2021-04-06 | Disposition: A | Discharge status: Home / Self Care | Payer: Medicare PPO | Source: Ambulatory Visit | Attending: Radiation Oncology | Admitting: Radiation Oncology

## 2021-04-06 ENCOUNTER — Encounter: Payer: Self-pay | Admitting: Radiation Oncology

## 2021-04-06 VITALS — BP 118/76 | HR 64 | Temp 96.2°F | Resp 18 | Ht 68.0 in | Wt 140.1 lb

## 2021-04-06 DIAGNOSIS — D0512 Intraductal carcinoma in situ of left breast: Secondary | ICD-10-CM | POA: Insufficient documentation

## 2021-04-06 DIAGNOSIS — Z170 Estrogen receptor positive status [ER+]: Secondary | ICD-10-CM | POA: Diagnosis not present

## 2021-04-06 NOTE — Progress Notes (Signed)
Location of Breast Cancer: Ductal carcinoma in situ (DCIS) of left breast  Histology per Pathology Report: Ductal carcinoma in situ (DCIS) of left breast   Receptor Status:  Estrogen Receptor: > 95%, positive, strong staining intensity       Progesterone Receptor: > 95%, positive, strong staining intensity   Did patient present with symptoms (if so, please note symptoms) or was this found on screening mammography?: routine screening mammography on 12/03/20 showing a possible calcification in the left breast  Past/Anticipated interventions by surgeon, if any:  02/25/2021 LEFT BREAST LUMPECTOMY WITH RADIOACTIVE SEED LOCALIZATION AND DEEP LEFT AXILLARY SENTINEL LYMPH NODE BIOPSY (Left)  SURGEON:  Jovita Kussmaul, MD - Primary  Past/Anticipated interventions by medical oncology, if any: recommend antiestrogen therapy with tamofixen  Lymphedema issues, if any:  yes left   Pain issues, if any:  yes, left axilla where seroma was constant and "feels like a bruise"  SAFETY ISSUES: Prior radiation? no Pacemaker/ICD? no Possible current pregnancy?no, postmenopausal Is the patient on methotrexate? no  Current Complaints / other details:  mild swelling to left axilla    Vitals:   04/06/21 0856  BP: 118/76  Pulse: 64  Resp: 18  Temp: (!) 96.2 F (35.7 C)  TempSrc: Temporal  SpO2: 100%  Weight: 140 lb 2 oz (63.6 kg)  Height: 5\' 8"  (1.727 m)

## 2021-04-06 NOTE — Progress Notes (Signed)
See MD note for nursing evaluation. °

## 2021-04-10 ENCOUNTER — Encounter: Payer: Self-pay | Admitting: *Deleted

## 2021-04-10 DIAGNOSIS — R300 Dysuria: Secondary | ICD-10-CM | POA: Diagnosis not present

## 2021-04-10 DIAGNOSIS — N3090 Cystitis, unspecified without hematuria: Secondary | ICD-10-CM | POA: Diagnosis not present

## 2021-04-13 ENCOUNTER — Other Ambulatory Visit: Payer: Self-pay

## 2021-04-13 ENCOUNTER — Ambulatory Visit
Admission: RE | Admit: 2021-04-13 | Discharge: 2021-04-13 | Disposition: A | Discharge status: Home / Self Care | Payer: Medicare PPO | Source: Ambulatory Visit | Attending: Radiation Oncology | Admitting: Radiation Oncology

## 2021-04-13 DIAGNOSIS — Z510 Encounter for antineoplastic radiation therapy: Secondary | ICD-10-CM | POA: Diagnosis not present

## 2021-04-13 DIAGNOSIS — Z170 Estrogen receptor positive status [ER+]: Secondary | ICD-10-CM | POA: Diagnosis not present

## 2021-04-13 DIAGNOSIS — D0512 Intraductal carcinoma in situ of left breast: Secondary | ICD-10-CM | POA: Diagnosis not present

## 2021-04-14 DIAGNOSIS — D0512 Intraductal carcinoma in situ of left breast: Secondary | ICD-10-CM | POA: Diagnosis not present

## 2021-04-17 DIAGNOSIS — Z8049 Family history of malignant neoplasm of other genital organs: Secondary | ICD-10-CM | POA: Diagnosis not present

## 2021-04-17 DIAGNOSIS — E785 Hyperlipidemia, unspecified: Secondary | ICD-10-CM | POA: Diagnosis not present

## 2021-04-17 DIAGNOSIS — Z803 Family history of malignant neoplasm of breast: Secondary | ICD-10-CM | POA: Diagnosis not present

## 2021-04-17 DIAGNOSIS — Z510 Encounter for antineoplastic radiation therapy: Secondary | ICD-10-CM | POA: Diagnosis not present

## 2021-04-17 DIAGNOSIS — D0512 Intraductal carcinoma in situ of left breast: Secondary | ICD-10-CM | POA: Diagnosis not present

## 2021-04-17 DIAGNOSIS — Z79810 Long term (current) use of selective estrogen receptor modulators (SERMs): Secondary | ICD-10-CM | POA: Diagnosis not present

## 2021-04-17 DIAGNOSIS — I341 Nonrheumatic mitral (valve) prolapse: Secondary | ICD-10-CM | POA: Diagnosis not present

## 2021-04-17 DIAGNOSIS — Z170 Estrogen receptor positive status [ER+]: Secondary | ICD-10-CM | POA: Diagnosis not present

## 2021-04-17 DIAGNOSIS — M8580 Other specified disorders of bone density and structure, unspecified site: Secondary | ICD-10-CM | POA: Diagnosis not present

## 2021-04-17 DIAGNOSIS — Z79899 Other long term (current) drug therapy: Secondary | ICD-10-CM | POA: Diagnosis not present

## 2021-04-20 ENCOUNTER — Encounter: Payer: Self-pay | Admitting: *Deleted

## 2021-04-23 ENCOUNTER — Ambulatory Visit
Admission: RE | Admit: 2021-04-23 | Discharge: 2021-04-23 | Disposition: A | Discharge status: Home / Self Care | Payer: Medicare PPO | Source: Ambulatory Visit | Attending: Radiation Oncology | Admitting: Radiation Oncology

## 2021-04-23 ENCOUNTER — Other Ambulatory Visit: Payer: Self-pay

## 2021-04-23 DIAGNOSIS — Z510 Encounter for antineoplastic radiation therapy: Secondary | ICD-10-CM | POA: Diagnosis not present

## 2021-04-23 DIAGNOSIS — E785 Hyperlipidemia, unspecified: Secondary | ICD-10-CM | POA: Diagnosis not present

## 2021-04-23 DIAGNOSIS — M8580 Other specified disorders of bone density and structure, unspecified site: Secondary | ICD-10-CM | POA: Diagnosis not present

## 2021-04-23 DIAGNOSIS — Z79810 Long term (current) use of selective estrogen receptor modulators (SERMs): Secondary | ICD-10-CM | POA: Diagnosis not present

## 2021-04-23 DIAGNOSIS — D0512 Intraductal carcinoma in situ of left breast: Secondary | ICD-10-CM

## 2021-04-23 DIAGNOSIS — I341 Nonrheumatic mitral (valve) prolapse: Secondary | ICD-10-CM | POA: Diagnosis not present

## 2021-04-23 DIAGNOSIS — Z79899 Other long term (current) drug therapy: Secondary | ICD-10-CM | POA: Diagnosis not present

## 2021-04-23 DIAGNOSIS — Z170 Estrogen receptor positive status [ER+]: Secondary | ICD-10-CM | POA: Diagnosis not present

## 2021-04-23 DIAGNOSIS — Z803 Family history of malignant neoplasm of breast: Secondary | ICD-10-CM | POA: Diagnosis not present

## 2021-04-24 ENCOUNTER — Ambulatory Visit
Admission: RE | Admit: 2021-04-24 | Discharge: 2021-04-24 | Disposition: A | Discharge status: Home / Self Care | Payer: Medicare PPO | Source: Ambulatory Visit | Attending: Radiation Oncology | Admitting: Radiation Oncology

## 2021-04-24 DIAGNOSIS — Z510 Encounter for antineoplastic radiation therapy: Secondary | ICD-10-CM | POA: Diagnosis not present

## 2021-04-24 DIAGNOSIS — Z79810 Long term (current) use of selective estrogen receptor modulators (SERMs): Secondary | ICD-10-CM | POA: Diagnosis not present

## 2021-04-24 DIAGNOSIS — E785 Hyperlipidemia, unspecified: Secondary | ICD-10-CM | POA: Diagnosis not present

## 2021-04-24 DIAGNOSIS — D0512 Intraductal carcinoma in situ of left breast: Secondary | ICD-10-CM | POA: Diagnosis not present

## 2021-04-24 DIAGNOSIS — Z170 Estrogen receptor positive status [ER+]: Secondary | ICD-10-CM | POA: Diagnosis not present

## 2021-04-24 DIAGNOSIS — I341 Nonrheumatic mitral (valve) prolapse: Secondary | ICD-10-CM | POA: Diagnosis not present

## 2021-04-24 DIAGNOSIS — Z803 Family history of malignant neoplasm of breast: Secondary | ICD-10-CM | POA: Diagnosis not present

## 2021-04-24 DIAGNOSIS — M8580 Other specified disorders of bone density and structure, unspecified site: Secondary | ICD-10-CM | POA: Diagnosis not present

## 2021-04-24 DIAGNOSIS — Z79899 Other long term (current) drug therapy: Secondary | ICD-10-CM | POA: Diagnosis not present

## 2021-04-24 MED ORDER — RADIAPLEXRX EX GEL: Freq: Once | CUTANEOUS | Status: AC

## 2021-04-24 MED ORDER — ALRA NON-METALLIC DEODORANT (RAD-ONC)
1.0000 "application " | Freq: Once | TOPICAL | Status: AC
  Administered 2021-04-24: 1 via TOPICAL

## 2021-04-24 NOTE — Progress Notes (Signed)

## 2021-04-27 ENCOUNTER — Ambulatory Visit
Admission: RE | Admit: 2021-04-27 | Discharge: 2021-04-27 | Disposition: A | Discharge status: Home / Self Care | Payer: Medicare PPO | Source: Ambulatory Visit | Attending: Radiation Oncology | Admitting: Radiation Oncology

## 2021-04-27 ENCOUNTER — Other Ambulatory Visit: Payer: Self-pay

## 2021-04-27 DIAGNOSIS — E785 Hyperlipidemia, unspecified: Secondary | ICD-10-CM | POA: Diagnosis not present

## 2021-04-27 DIAGNOSIS — M8580 Other specified disorders of bone density and structure, unspecified site: Secondary | ICD-10-CM | POA: Diagnosis not present

## 2021-04-27 DIAGNOSIS — Z170 Estrogen receptor positive status [ER+]: Secondary | ICD-10-CM | POA: Diagnosis not present

## 2021-04-27 DIAGNOSIS — Z803 Family history of malignant neoplasm of breast: Secondary | ICD-10-CM | POA: Diagnosis not present

## 2021-04-27 DIAGNOSIS — Z79810 Long term (current) use of selective estrogen receptor modulators (SERMs): Secondary | ICD-10-CM | POA: Diagnosis not present

## 2021-04-27 DIAGNOSIS — D0512 Intraductal carcinoma in situ of left breast: Secondary | ICD-10-CM | POA: Diagnosis not present

## 2021-04-27 DIAGNOSIS — I341 Nonrheumatic mitral (valve) prolapse: Secondary | ICD-10-CM | POA: Diagnosis not present

## 2021-04-27 DIAGNOSIS — Z510 Encounter for antineoplastic radiation therapy: Secondary | ICD-10-CM | POA: Diagnosis not present

## 2021-04-27 DIAGNOSIS — Z79899 Other long term (current) drug therapy: Secondary | ICD-10-CM | POA: Diagnosis not present

## 2021-04-28 ENCOUNTER — Ambulatory Visit
Admission: RE | Admit: 2021-04-28 | Discharge: 2021-04-28 | Disposition: A | Discharge status: Home / Self Care | Payer: Medicare PPO | Source: Ambulatory Visit | Attending: Radiation Oncology | Admitting: Radiation Oncology

## 2021-04-28 ENCOUNTER — Other Ambulatory Visit: Payer: Self-pay

## 2021-04-28 DIAGNOSIS — I341 Nonrheumatic mitral (valve) prolapse: Secondary | ICD-10-CM | POA: Diagnosis not present

## 2021-04-28 DIAGNOSIS — D0512 Intraductal carcinoma in situ of left breast: Secondary | ICD-10-CM | POA: Diagnosis not present

## 2021-04-28 DIAGNOSIS — M8580 Other specified disorders of bone density and structure, unspecified site: Secondary | ICD-10-CM | POA: Diagnosis not present

## 2021-04-28 DIAGNOSIS — Z79810 Long term (current) use of selective estrogen receptor modulators (SERMs): Secondary | ICD-10-CM | POA: Diagnosis not present

## 2021-04-28 DIAGNOSIS — Z510 Encounter for antineoplastic radiation therapy: Secondary | ICD-10-CM | POA: Diagnosis not present

## 2021-04-28 DIAGNOSIS — Z170 Estrogen receptor positive status [ER+]: Secondary | ICD-10-CM | POA: Diagnosis not present

## 2021-04-28 DIAGNOSIS — E785 Hyperlipidemia, unspecified: Secondary | ICD-10-CM | POA: Diagnosis not present

## 2021-04-28 DIAGNOSIS — Z803 Family history of malignant neoplasm of breast: Secondary | ICD-10-CM | POA: Diagnosis not present

## 2021-04-28 DIAGNOSIS — Z79899 Other long term (current) drug therapy: Secondary | ICD-10-CM | POA: Diagnosis not present

## 2021-04-29 ENCOUNTER — Ambulatory Visit: Payer: Medicare PPO

## 2021-04-29 DIAGNOSIS — J111 Influenza due to unidentified influenza virus with other respiratory manifestations: Secondary | ICD-10-CM | POA: Diagnosis not present

## 2021-04-29 DIAGNOSIS — Z20828 Contact with and (suspected) exposure to other viral communicable diseases: Secondary | ICD-10-CM | POA: Diagnosis not present

## 2021-04-29 DIAGNOSIS — R0981 Nasal congestion: Secondary | ICD-10-CM | POA: Diagnosis not present

## 2021-04-30 ENCOUNTER — Ambulatory Visit: Payer: Medicare PPO

## 2021-05-01 ENCOUNTER — Ambulatory Visit: Payer: Medicare PPO

## 2021-05-01 DIAGNOSIS — Z20822 Contact with and (suspected) exposure to covid-19: Secondary | ICD-10-CM | POA: Diagnosis not present

## 2021-05-01 DIAGNOSIS — J029 Acute pharyngitis, unspecified: Secondary | ICD-10-CM | POA: Diagnosis not present

## 2021-05-01 DIAGNOSIS — J101 Influenza due to other identified influenza virus with other respiratory manifestations: Secondary | ICD-10-CM | POA: Diagnosis not present

## 2021-05-01 DIAGNOSIS — J020 Streptococcal pharyngitis: Secondary | ICD-10-CM | POA: Diagnosis not present

## 2021-05-01 DIAGNOSIS — R059 Cough, unspecified: Secondary | ICD-10-CM | POA: Diagnosis not present

## 2021-05-03 ENCOUNTER — Ambulatory Visit: Payer: Medicare PPO

## 2021-05-04 ENCOUNTER — Ambulatory Visit: Admission: RE | Admit: 2021-05-04 | Payer: Medicare PPO | Source: Ambulatory Visit

## 2021-05-04 ENCOUNTER — Other Ambulatory Visit: Payer: Self-pay

## 2021-05-05 ENCOUNTER — Ambulatory Visit: Payer: Medicare PPO

## 2021-05-06 ENCOUNTER — Ambulatory Visit: Payer: Medicare PPO

## 2021-05-11 ENCOUNTER — Other Ambulatory Visit: Payer: Self-pay

## 2021-05-11 ENCOUNTER — Ambulatory Visit
Admission: RE | Admit: 2021-05-11 | Discharge: 2021-05-11 | Disposition: A | Discharge status: Home / Self Care | Payer: Medicare PPO | Source: Ambulatory Visit | Attending: Radiation Oncology | Admitting: Radiation Oncology

## 2021-05-11 DIAGNOSIS — Z79810 Long term (current) use of selective estrogen receptor modulators (SERMs): Secondary | ICD-10-CM | POA: Diagnosis not present

## 2021-05-11 DIAGNOSIS — M8580 Other specified disorders of bone density and structure, unspecified site: Secondary | ICD-10-CM | POA: Diagnosis not present

## 2021-05-11 DIAGNOSIS — E785 Hyperlipidemia, unspecified: Secondary | ICD-10-CM | POA: Diagnosis not present

## 2021-05-11 DIAGNOSIS — I341 Nonrheumatic mitral (valve) prolapse: Secondary | ICD-10-CM | POA: Diagnosis not present

## 2021-05-11 DIAGNOSIS — Z170 Estrogen receptor positive status [ER+]: Secondary | ICD-10-CM | POA: Diagnosis not present

## 2021-05-11 DIAGNOSIS — D0512 Intraductal carcinoma in situ of left breast: Secondary | ICD-10-CM | POA: Diagnosis not present

## 2021-05-11 DIAGNOSIS — Z79899 Other long term (current) drug therapy: Secondary | ICD-10-CM | POA: Diagnosis not present

## 2021-05-11 DIAGNOSIS — Z510 Encounter for antineoplastic radiation therapy: Secondary | ICD-10-CM | POA: Diagnosis not present

## 2021-05-11 DIAGNOSIS — Z803 Family history of malignant neoplasm of breast: Secondary | ICD-10-CM | POA: Diagnosis not present

## 2021-05-12 ENCOUNTER — Ambulatory Visit
Admission: RE | Admit: 2021-05-12 | Discharge: 2021-05-12 | Disposition: A | Discharge status: Home / Self Care | Payer: Medicare PPO | Source: Ambulatory Visit | Attending: Radiation Oncology | Admitting: Radiation Oncology

## 2021-05-12 ENCOUNTER — Inpatient Hospital Stay: Payer: Medicare PPO | Attending: Hematology | Admitting: Hematology

## 2021-05-12 ENCOUNTER — Ambulatory Visit: Payer: Medicare PPO | Admitting: Radiation Oncology

## 2021-05-12 ENCOUNTER — Encounter: Payer: Self-pay | Admitting: Hematology

## 2021-05-12 VITALS — BP 123/83 | HR 60 | Temp 97.7°F | Resp 18 | Ht 68.0 in | Wt 141.5 lb

## 2021-05-12 DIAGNOSIS — E785 Hyperlipidemia, unspecified: Secondary | ICD-10-CM | POA: Insufficient documentation

## 2021-05-12 DIAGNOSIS — Z79899 Other long term (current) drug therapy: Secondary | ICD-10-CM | POA: Insufficient documentation

## 2021-05-12 DIAGNOSIS — Z803 Family history of malignant neoplasm of breast: Secondary | ICD-10-CM | POA: Diagnosis not present

## 2021-05-12 DIAGNOSIS — D0512 Intraductal carcinoma in situ of left breast: Secondary | ICD-10-CM | POA: Diagnosis not present

## 2021-05-12 DIAGNOSIS — M8580 Other specified disorders of bone density and structure, unspecified site: Secondary | ICD-10-CM | POA: Insufficient documentation

## 2021-05-12 DIAGNOSIS — Z79810 Long term (current) use of selective estrogen receptor modulators (SERMs): Secondary | ICD-10-CM | POA: Insufficient documentation

## 2021-05-12 DIAGNOSIS — Z170 Estrogen receptor positive status [ER+]: Secondary | ICD-10-CM | POA: Diagnosis not present

## 2021-05-12 DIAGNOSIS — Z510 Encounter for antineoplastic radiation therapy: Secondary | ICD-10-CM | POA: Diagnosis not present

## 2021-05-12 DIAGNOSIS — Z8049 Family history of malignant neoplasm of other genital organs: Secondary | ICD-10-CM | POA: Insufficient documentation

## 2021-05-12 DIAGNOSIS — I341 Nonrheumatic mitral (valve) prolapse: Secondary | ICD-10-CM | POA: Diagnosis not present

## 2021-05-12 MED ORDER — TAMOXIFEN CITRATE 20 MG PO TABS: 20.0000 mg | ORAL_TABLET | Freq: Every day | ORAL | 3 refills | Status: DC

## 2021-05-12 NOTE — Progress Notes (Signed)
[place]   [phone] [phone]   Clinic Follow up Note   Patient Care Team: Marco Collie, MD as PCP - General (Family Medicine) Karma Ganja, NP as Nurse Practitioner (Gynecology) Mauro Kaufmann, RN as Oncology Nurse Navigator Rockwell Germany, RN as Oncology Nurse Navigator Truitt Merle, MD as Consulting Physician (Hematology) Jovita Kussmaul, MD as Consulting Physician (General Surgery) Gery Pray, MD as Consulting Physician (Radiation Oncology) Richardo Priest, MD as Consulting Physician (Cardiology) Landis Martins, DPM as Consulting Physician (Podiatry) Sydnee Levans, MD as Referring Physician (Dermatology)  Date of Service:  05/12/2021  CHIEF COMPLAINT: f/u of left breast DCIS  CURRENT THERAPY:  Adjuvant radiation therapy, to finish on 06/02/21  ASSESSMENT & PLAN:  Kelli Saunders is a 70 y.o. female with   1. Left breast DCIS with microinvasion, grade 3, ER+/PR+ -found on screening mammogram. Left diagnostic MM and [place] 12/18/20 showed 2.2 cm calcifications at 1 o'clock. Biopsy 12/30/20 showed DCIS. -breast MRI on 01/13/21 showed no other areas of concern. -left lumpectomy on 02/25/21 with Dr. Marlou Starks showed DCIS with a focus suspicious for microinvasive carcinoma, 1.5 cm. Margins and lymph nodes negative. -her postoperative course was complicated by seroma and illness. She has recovered well  -she began adjuvant radiation with Dr. Sondra Come on 04/23/21. -Given her strongly positive ER and PR and high grade DCIS, I do recommend antiestrogen therapy with tamofixen or anastrozole, which decrease her risk of future breast cancer by ~40%.  Given her osteopenia, tamoxifen is a better option for her. ---The potential side effects of Tamoxifen, which includes but not limited to, hot flash, skin and vaginal dryness, slightly increased risk of cardiovascular disease and cataract, small risk of thrombosis and endometrial cancer, were discussed with  her in great details. Preventive strategies for thrombosis, such as being physically active, using compression stocks, avoid cigarette smoking, etc., were reviewed with her. I also recommend her to follow-up with her gynecologist once a year, and watch for vaginal spotting or bleeding, as a clinically sign of endometrial cancer, etc. She voiced good understanding, and agrees to proceed. Will start after she completes adjuvant breast radiation -We discussed breast cancer surveillance after she completes treatment, Including annual mammogram and breast exam every 6-12 months.  2. Genetics -she reports breast cancer in maternal aunt and cervical cancer in her mother. She is interested in testing.  3. Osteopenia -DEXA in 08/13/19 showed osteopenia with lowest T-score -2.1. -she is taking vit D and calcium supplements     Plan: -continue daily radiation through 12/20 -genetic referral -start tamoxifen in 06/2021, prescribed today -survivorship in 3 months  -lab and f/u in 6 months   No problem-specific Assessment & Plan notes found for this encounter.   SUMMARY OF ONCOLOGIC HISTORY: Oncology History Overview Note  Cancer Staging Ductal carcinoma in situ (DCIS) of left breast Staging form: Breast, AJCC 8th Edition - Clinical: No stage assigned - Unsigned    Ductal carcinoma in situ (DCIS) of left breast  12/18/2020 Mammogram   Left Diagnostic  IMPRESSION: The new 2.2 cm cluster of linear fine pleomorphic calcifications in the left breast at 1 o'clock in the retroreolar region 1 cm from the nipple. No other significant masses, calcifications, or other findings are seen in either breast.   01/05/2021 Initial Diagnosis   Ductal carcinoma in situ (DCIS) of left breast      INTERVAL HISTORY:  Kelli Saunders is here for a follow up of DCIS. She was  last seen by me on 01/07/21. She presents to the clinic accompanied by her friend Shirlean Mylar. She reports what sounds like a rough time since  her last surgery-- she developed a seroma, caught the flu, as well as strep.   All other systems were reviewed with the patient and are negative.  MEDICAL HISTORY:  Past Medical History:  Diagnosis Date   Allergy 10/15/1958   Breast cancer ([place]) 12/30/2020   Family history of long QT syndrome 12/01/2017   Heart murmur 1981   Hx of transfusion of packed red blood cells 1981   after childbirth   Hyperlipidemia 12/01/2017   Left atrial enlargement 12/27/2014   Mitral valve prolapse 12/01/2017   MVP (mitral valve prolapse)    Osteopenia    Palpitation 12/26/2014   Pectus excavatum 12/01/2017   Thoracic scoliosis 12/01/2017   Wrist fracture, right 07/2013   slipped on ice    SURGICAL HISTORY: Past Surgical History:  Procedure Laterality Date   BREAST LUMPECTOMY WITH RADIOACTIVE SEED AND SENTINEL LYMPH NODE BIOPSY Left 02/25/2021   Procedure: LEFT BREAST LUMPECTOMY WITH RADIOACTIVE SEED AND SENTINEL LYMPH NODE BIOPSY;  Surgeon: Jovita Kussmaul, MD;  Location: [facility];  Service: General;  Laterality: Left;   DRAINAGE AND CLOSURE OF LYMPHOCELE Left 03/19/2021   Procedure: Placement drain left axillary seroma;  Surgeon: Jovita Kussmaul, MD;  Location: [facility];  Service: General;  Laterality: Left;   L5 discectomy  2007   SPINE SURGERY  2007   TUBAL LIGATION  1993    I have reviewed the social history and family history with the patient and they are unchanged from previous note.  ALLERGIES:  is allergic to sulfa antibiotics.  MEDICATIONS:  Current Outpatient Medications  Medication Sig Dispense Refill   tamoxifen (NOLVADEX) 20 MG tablet Take 1 tablet (20 mg total) by mouth daily. 30 tablet 3   acetaminophen (TYLENOL) 650 MG CR tablet Take 650-1,300 mg by mouth every 8 (eight) hours as needed for pain.     bimatoprost (LATISSE) 0.03 % ophthalmic solution Apply 1 application to eye daily. Uses on eye brows, not in eye     Calcium Carb-Cholecalciferol (CALCIUM 600 + D PO)  Take 1 tablet by mouth daily. (Patient not taking: Reported on 04/06/2021)     Coenzyme Q10 (CO Q 10 PO) Take 1 tablet by mouth daily. (Patient not taking: Reported on 04/06/2021)     HYDROcodone-acetaminophen (NORCO/VICODIN) 5-325 MG tablet Take 1 tablet by mouth every 6 (six) hours as needed for moderate pain or severe pain. (Patient not taking: Reported on 04/06/2021) 10 tablet 0   loratadine (CLARITIN) 10 MG tablet Take 10 mg by mouth daily as needed for allergies.     metoprolol succinate (TOPROL XL) 25 MG 24 hr tablet Take 0.5 tablets (12.5 mg total) by mouth 3 (three) times a week. (Patient taking differently: Take 12.5 mg by mouth every Monday, Wednesday, and Friday.) 20 tablet 3   Multiple Vitamin (MULTIVITAMIN) tablet Take 1 tablet by mouth daily.     Omega-3 Fatty Acids (FISH OIL PO) Take 1 tablet by mouth daily.  (Patient not taking: Reported on 04/06/2021)     Probiotic Product (FORTIFY PROBIOTIC WOMENS EX ST PO) Take 1 tablet by mouth daily.     UNABLE TO FIND Place 1 capsule into both eyes daily. HydroEye Softgels Dry Eye Relief ( proprietary blend of GLA and omega 3 fatty acids (EPA and DHA) (Patient not taking: Reported on 04/06/2021)  VITAMIN D PO Take 5,000 Units by mouth daily. (Patient not taking: Reported on 04/06/2021)     No current facility-administered medications for this visit.    PHYSICAL EXAMINATION: ECOG PERFORMANCE STATUS: 0 - Asymptomatic  Vitals:   05/12/21 1227  BP: 123/83  Pulse: 60  Resp: 18  Temp: 97.7 F (36.5 C)  SpO2: 100%   Wt Readings from Last 3 Encounters:  05/12/21 141 lb 8 oz (64.2 kg)  04/06/21 140 lb 2 oz (63.6 kg)  03/19/21 138 lb 3.2 oz (62.7 kg)     GENERAL:alert, no distress and comfortable SKIN: skin color, texture, turgor are normal, no rashes or significant lesions EYES: normal, Conjunctiva are pink and non-injected, sclera clear  NECK: supple, thyroid normal size, non-tender, without nodularity LYMPH:  no palpable  lymphadenopathy in the cervical, axillary  LUNGS: clear to auscultation and percussion with normal breathing effort HEART: regular rate & rhythm and no murmurs and no lower extremity edema ABDOMEN:abdomen soft, non-tender and normal bowel sounds Musculoskeletal:no cyanosis of digits and no clubbing  NEURO: alert & oriented x 3 with fluent speech, no focal motor/sensory deficits BREAST: No palpable mass, nodules or adenopathy bilaterally. Breast exam benign.   LABORATORY DATA:  I have reviewed the data as listed CBC Latest Ref Rng & Units 01/07/2021 04/05/2017 03/03/2016  WBC 4.0 - 10.5 K/uL 6.4 5.9 6.0  Hemoglobin 12.0 - 15.0 g/dL 14.8 15.8 15.7(H)  Hematocrit 36.0 - 46.0 % 43.6 46.9(H) 47.0(H)  Platelets 150 - 400 K/uL 216 211 219     CMP Latest Ref Rng & Units 01/07/2021 12/21/2018 12/21/2017  Glucose 70 - 99 mg/dL 91 97 89  BUN 8 - 23 mg/dL 13 14 13   Creatinine 0.44 - 1.00 mg/dL 0.84 0.77 0.82  Sodium 135 - 145 mmol/L 141 137 138  Potassium 3.5 - 5.1 mmol/L 4.0 4.4 4.0  Chloride 98 - 111 mmol/L 105 98 97  CO2 22 - 32 mmol/L 26 24 25   Calcium 8.9 - 10.3 mg/dL 9.8 9.7 9.9  Total Protein 6.5 - 8.1 g/dL 7.1 6.7 6.8  Total Bilirubin 0.3 - 1.2 mg/dL 1.1 1.1 1.2  Alkaline Phos 38 - 126 U/L 72 66 68  AST 15 - 41 U/L 29 29 27   ALT 0 - 44 U/L 19 21 17       RADIOGRAPHIC STUDIES: I have personally reviewed the radiological images as listed and agreed with the findings in the report. No results found.    Orders Placed This Encounter  Procedures   Ambulatory referral to Genetics    Referral Priority:   Routine    Referral Type:   Consultation    Referral Reason:   Specialty Services Required    Number of Visits Requested:   1   All questions were answered. The patient knows to call the clinic with any problems, questions or concerns. No barriers to learning was detected. The total time spent in the appointment was 30 minutes.     Truitt Merle, MD 05/12/2021   I, Wilburn Mylar,  am acting as scribe for Truitt Merle, MD.   I have reviewed the above documentation for accuracy and completeness, and I agree with the above.

## 2021-05-13 ENCOUNTER — Other Ambulatory Visit: Payer: Self-pay

## 2021-05-13 ENCOUNTER — Ambulatory Visit
Admission: RE | Admit: 2021-05-13 | Discharge: 2021-05-13 | Disposition: A | Discharge status: Home / Self Care | Payer: Medicare PPO | Source: Ambulatory Visit | Attending: Radiation Oncology | Admitting: Radiation Oncology

## 2021-05-13 DIAGNOSIS — Z170 Estrogen receptor positive status [ER+]: Secondary | ICD-10-CM | POA: Diagnosis not present

## 2021-05-13 DIAGNOSIS — Z510 Encounter for antineoplastic radiation therapy: Secondary | ICD-10-CM | POA: Diagnosis not present

## 2021-05-13 DIAGNOSIS — I341 Nonrheumatic mitral (valve) prolapse: Secondary | ICD-10-CM | POA: Diagnosis not present

## 2021-05-13 DIAGNOSIS — E785 Hyperlipidemia, unspecified: Secondary | ICD-10-CM | POA: Diagnosis not present

## 2021-05-13 DIAGNOSIS — Z79810 Long term (current) use of selective estrogen receptor modulators (SERMs): Secondary | ICD-10-CM | POA: Diagnosis not present

## 2021-05-13 DIAGNOSIS — M8580 Other specified disorders of bone density and structure, unspecified site: Secondary | ICD-10-CM | POA: Diagnosis not present

## 2021-05-13 DIAGNOSIS — D0512 Intraductal carcinoma in situ of left breast: Secondary | ICD-10-CM | POA: Diagnosis not present

## 2021-05-13 DIAGNOSIS — Z79899 Other long term (current) drug therapy: Secondary | ICD-10-CM | POA: Diagnosis not present

## 2021-05-13 DIAGNOSIS — Z803 Family history of malignant neoplasm of breast: Secondary | ICD-10-CM | POA: Diagnosis not present

## 2021-05-14 ENCOUNTER — Ambulatory Visit: Payer: Medicare PPO | Admitting: Nurse Practitioner

## 2021-05-14 ENCOUNTER — Ambulatory Visit
Admission: RE | Admit: 2021-05-14 | Discharge: 2021-05-14 | Disposition: A | Discharge status: Home / Self Care | Payer: Medicare PPO | Source: Ambulatory Visit | Attending: Radiation Oncology | Admitting: Radiation Oncology

## 2021-05-14 DIAGNOSIS — Z510 Encounter for antineoplastic radiation therapy: Secondary | ICD-10-CM | POA: Insufficient documentation

## 2021-05-14 DIAGNOSIS — D0512 Intraductal carcinoma in situ of left breast: Secondary | ICD-10-CM | POA: Insufficient documentation

## 2021-05-15 ENCOUNTER — Other Ambulatory Visit: Payer: Self-pay

## 2021-05-15 ENCOUNTER — Ambulatory Visit
Admission: RE | Admit: 2021-05-15 | Discharge: 2021-05-15 | Disposition: A | Discharge status: Home / Self Care | Payer: Medicare PPO | Source: Ambulatory Visit | Attending: Radiation Oncology | Admitting: Radiation Oncology

## 2021-05-15 DIAGNOSIS — Z510 Encounter for antineoplastic radiation therapy: Secondary | ICD-10-CM | POA: Diagnosis not present

## 2021-05-15 DIAGNOSIS — D0512 Intraductal carcinoma in situ of left breast: Secondary | ICD-10-CM

## 2021-05-15 MED ORDER — RADIAPLEXRX EX GEL: Freq: Once | CUTANEOUS | Status: AC

## 2021-05-16 DIAGNOSIS — Z510 Encounter for antineoplastic radiation therapy: Secondary | ICD-10-CM | POA: Diagnosis not present

## 2021-05-16 DIAGNOSIS — D0512 Intraductal carcinoma in situ of left breast: Secondary | ICD-10-CM | POA: Diagnosis not present

## 2021-05-18 ENCOUNTER — Ambulatory Visit: Payer: Medicare PPO

## 2021-05-18 ENCOUNTER — Other Ambulatory Visit: Payer: Self-pay

## 2021-05-18 DIAGNOSIS — D0512 Intraductal carcinoma in situ of left breast: Secondary | ICD-10-CM | POA: Diagnosis not present

## 2021-05-18 DIAGNOSIS — Z510 Encounter for antineoplastic radiation therapy: Secondary | ICD-10-CM | POA: Diagnosis not present

## 2021-05-19 ENCOUNTER — Ambulatory Visit
Admission: RE | Admit: 2021-05-19 | Discharge: 2021-05-19 | Disposition: A | Discharge status: Home / Self Care | Payer: Medicare PPO | Source: Ambulatory Visit | Attending: Radiation Oncology | Admitting: Radiation Oncology

## 2021-05-19 DIAGNOSIS — D0512 Intraductal carcinoma in situ of left breast: Secondary | ICD-10-CM | POA: Diagnosis not present

## 2021-05-19 DIAGNOSIS — Z510 Encounter for antineoplastic radiation therapy: Secondary | ICD-10-CM | POA: Diagnosis not present

## 2021-05-20 ENCOUNTER — Other Ambulatory Visit: Payer: Self-pay

## 2021-05-20 ENCOUNTER — Ambulatory Visit
Admission: RE | Admit: 2021-05-20 | Discharge: 2021-05-20 | Disposition: A | Discharge status: Home / Self Care | Payer: Medicare PPO | Source: Ambulatory Visit | Attending: Radiation Oncology | Admitting: Radiation Oncology

## 2021-05-20 DIAGNOSIS — D0512 Intraductal carcinoma in situ of left breast: Secondary | ICD-10-CM | POA: Diagnosis not present

## 2021-05-20 DIAGNOSIS — Z510 Encounter for antineoplastic radiation therapy: Secondary | ICD-10-CM | POA: Diagnosis not present

## 2021-05-21 ENCOUNTER — Ambulatory Visit
Admission: RE | Admit: 2021-05-21 | Discharge: 2021-05-21 | Disposition: A | Discharge status: Home / Self Care | Payer: Medicare PPO | Source: Ambulatory Visit | Attending: Radiation Oncology | Admitting: Radiation Oncology

## 2021-05-21 ENCOUNTER — Ambulatory Visit: Payer: Medicare PPO

## 2021-05-21 DIAGNOSIS — Z510 Encounter for antineoplastic radiation therapy: Secondary | ICD-10-CM | POA: Diagnosis not present

## 2021-05-21 DIAGNOSIS — D0512 Intraductal carcinoma in situ of left breast: Secondary | ICD-10-CM | POA: Diagnosis not present

## 2021-05-22 ENCOUNTER — Ambulatory Visit: Payer: Medicare PPO

## 2021-05-22 ENCOUNTER — Ambulatory Visit
Admission: RE | Admit: 2021-05-22 | Discharge: 2021-05-22 | Disposition: A | Discharge status: Home / Self Care | Payer: Medicare PPO | Source: Ambulatory Visit | Attending: Radiation Oncology | Admitting: Radiation Oncology

## 2021-05-22 ENCOUNTER — Other Ambulatory Visit: Payer: Self-pay

## 2021-05-22 DIAGNOSIS — Z510 Encounter for antineoplastic radiation therapy: Secondary | ICD-10-CM | POA: Diagnosis not present

## 2021-05-22 DIAGNOSIS — D0512 Intraductal carcinoma in situ of left breast: Secondary | ICD-10-CM | POA: Diagnosis not present

## 2021-05-25 ENCOUNTER — Ambulatory Visit: Payer: Medicare PPO

## 2021-05-25 ENCOUNTER — Encounter: Payer: Self-pay | Admitting: *Deleted

## 2021-05-25 ENCOUNTER — Other Ambulatory Visit: Payer: Self-pay

## 2021-05-25 ENCOUNTER — Ambulatory Visit
Admission: RE | Admit: 2021-05-25 | Discharge: 2021-05-25 | Disposition: A | Discharge status: Home / Self Care | Payer: Medicare PPO | Source: Ambulatory Visit | Attending: Radiation Oncology | Admitting: Radiation Oncology

## 2021-05-25 DIAGNOSIS — D0512 Intraductal carcinoma in situ of left breast: Secondary | ICD-10-CM

## 2021-05-25 DIAGNOSIS — Z510 Encounter for antineoplastic radiation therapy: Secondary | ICD-10-CM | POA: Diagnosis not present

## 2021-05-26 ENCOUNTER — Ambulatory Visit
Admission: RE | Admit: 2021-05-26 | Discharge: 2021-05-26 | Disposition: A | Discharge status: Home / Self Care | Payer: Medicare PPO | Source: Ambulatory Visit | Attending: Radiation Oncology | Admitting: Radiation Oncology

## 2021-05-26 DIAGNOSIS — D0512 Intraductal carcinoma in situ of left breast: Secondary | ICD-10-CM | POA: Diagnosis not present

## 2021-05-26 DIAGNOSIS — Z510 Encounter for antineoplastic radiation therapy: Secondary | ICD-10-CM | POA: Diagnosis not present

## 2021-05-27 ENCOUNTER — Ambulatory Visit
Admission: RE | Admit: 2021-05-27 | Discharge: 2021-05-27 | Disposition: A | Discharge status: Home / Self Care | Payer: Medicare PPO | Source: Ambulatory Visit | Attending: Radiation Oncology | Admitting: Radiation Oncology

## 2021-05-27 ENCOUNTER — Other Ambulatory Visit: Payer: Self-pay

## 2021-05-27 DIAGNOSIS — D0512 Intraductal carcinoma in situ of left breast: Secondary | ICD-10-CM | POA: Diagnosis not present

## 2021-05-27 DIAGNOSIS — Z510 Encounter for antineoplastic radiation therapy: Secondary | ICD-10-CM | POA: Diagnosis not present

## 2021-05-28 ENCOUNTER — Ambulatory Visit: Payer: Medicare PPO

## 2021-05-28 ENCOUNTER — Encounter: Payer: Self-pay | Admitting: Genetic Counselor

## 2021-05-28 ENCOUNTER — Other Ambulatory Visit: Payer: Self-pay | Admitting: Genetic Counselor

## 2021-05-28 ENCOUNTER — Inpatient Hospital Stay: Payer: Medicare PPO | Attending: Hematology | Admitting: Genetic Counselor

## 2021-05-28 ENCOUNTER — Inpatient Hospital Stay: Payer: Medicare PPO

## 2021-05-28 ENCOUNTER — Ambulatory Visit
Admission: RE | Admit: 2021-05-28 | Discharge: 2021-05-28 | Disposition: A | Discharge status: Home / Self Care | Payer: Medicare PPO | Source: Ambulatory Visit | Attending: Radiation Oncology | Admitting: Radiation Oncology

## 2021-05-28 DIAGNOSIS — Z803 Family history of malignant neoplasm of breast: Secondary | ICD-10-CM | POA: Insufficient documentation

## 2021-05-28 DIAGNOSIS — D0512 Intraductal carcinoma in situ of left breast: Secondary | ICD-10-CM

## 2021-05-28 DIAGNOSIS — Z510 Encounter for antineoplastic radiation therapy: Secondary | ICD-10-CM | POA: Diagnosis not present

## 2021-05-28 LAB — GENETIC SCREENING ORDER

## 2021-05-28 NOTE — Progress Notes (Signed)
REFERRING PROVIDER: Truitt Merle, MD [address] [place],  [place] [address]  PRIMARY PROVIDER:  Marco Collie, MD  PRIMARY REASON FOR VISIT:  1. Family history of breast cancer   2. Ductal carcinoma in situ (DCIS) of left breast      HISTORY OF PRESENT ILLNESS:   Kelli Saunders, a 70 y.o. female, was seen for a [facility] cancer genetics consultation at the request of Dr. Burr Medico due to a personal and family history of breast cancer.  Ms. [place] presents to clinic today to discuss the possibility of a hereditary predisposition to cancer, genetic testing, and to further clarify her future cancer risks, as well as potential cancer risks for family members.   In July 2022, at the age of 60, Ms. Brabec was diagnosed with DCIS of the left breast. The treatment plan lumpectomy and radiation. The patient has a family history of Long QT syndrome, for which she was tested in August and was negative.     CANCER HISTORY:  Oncology History Overview Note  Cancer Staging Ductal carcinoma in situ (DCIS) of left breast Staging form: Breast, AJCC 8th Edition - Clinical: No stage assigned - Unsigned    Ductal carcinoma in situ (DCIS) of left breast  12/18/2020 Mammogram   Left Diagnostic  IMPRESSION: The new 2.2 cm cluster of linear fine pleomorphic calcifications in the left breast at 1 o'clock in the retroreolar region 1 cm from the nipple. No other significant masses, calcifications, or other findings are seen in either breast.   01/05/2021 Initial Diagnosis   Ductal carcinoma in situ (DCIS) of left breast      RISK FACTORS:  Menarche was at age 66.  First live birth at age 55.  OCP use for approximately 0 years.  Ovaries intact: yes.  Hysterectomy: no.  Menopausal status: postmenopausal.  HRT use: 0 years. Colonoscopy: yes; normal. Mammogram within the last year: yes. Number of breast biopsies: 1. Up to date with pelvic exams: yes. Any excessive radiation exposure  in the past: for breast cancer treatment  Past Medical History:  Diagnosis Date   Allergy 10/15/1958   Breast cancer ([place]) 12/30/2020   Family history of breast cancer    Family history of long QT syndrome 12/01/2017   Heart murmur 1981   Hx of transfusion of packed red blood cells 1981   after childbirth   Hyperlipidemia 12/01/2017   Left atrial enlargement 12/27/2014   Mitral valve prolapse 12/01/2017   MVP (mitral valve prolapse)    Osteopenia    Palpitation 12/26/2014   Pectus excavatum 12/01/2017   Thoracic scoliosis 12/01/2017   Wrist fracture, right 07/2013   slipped on ice    Past Surgical History:  Procedure Laterality Date   BREAST LUMPECTOMY WITH RADIOACTIVE SEED AND SENTINEL LYMPH NODE BIOPSY Left 02/25/2021   Procedure: LEFT BREAST LUMPECTOMY WITH RADIOACTIVE SEED AND SENTINEL LYMPH NODE BIOPSY;  Surgeon: Jovita Kussmaul, MD;  Location: [facility];  Service: General;  Laterality: Left;   DRAINAGE AND CLOSURE OF LYMPHOCELE Left 03/19/2021   Procedure: Placement drain left axillary seroma;  Surgeon: Jovita Kussmaul, MD;  Location: [facility];  Service: General;  Laterality: Left;   L5 discectomy  2007   SPINE SURGERY  2007   TUBAL LIGATION  1993    Social History   Socioeconomic History   Marital status: Married    Spouse name: Not on file   Number of children: 2   Years of education: Not on  file   Highest education level: Not on file  Occupational History   Not on file  Tobacco Use   Smoking status: Never   Smokeless tobacco: Never  Vaping Use   Vaping Use: Never used  Substance and Sexual Activity   Alcohol use: Not Currently   Drug use: No   Sexual activity: Yes    Partners: Male    Birth control/protection: Surgical    Comment: BTL  Other Topics Concern   Not on file  Social History Narrative   Not on file   Social Determinants of Health   Financial Resource Strain: Not on file  Food Insecurity: Not on file  Transportation Needs: Not  on file  Physical Activity: Not on file  Stress: Not on file  Social Connections: Not on file     FAMILY HISTORY:  We obtained a detailed, 4-generation family history.  Significant diagnoses are listed below: Family History  Problem Relation Age of Onset   Hypertension Mother    Mitral valve prolapse Mother    Arthritis Mother    Varicose Veins Mother    Hypertension Father    Alzheimer's disease Father    Breast cancer Maternal Aunt 35   Dementia Paternal Aunt    Pneumonia Paternal Aunt    Diabetes Maternal Grandmother    Kidney failure Maternal Grandfather    Diabetes Paternal Grandfather    Congestive Heart Failure Paternal Grandfather      The patient has a daughter and son who transitioned from female who are cancer free. She has two brothers who are cancer free.  Her mother is living and her father is deceased.  The patient's mother has not been tested for Long QT syndrome.  She has a sister who had breast cancer and died in her 71's and four brothers, one who is known to have Long QT syndrome.  The maternal grandparents are deceased from non-cancer related issues.  The patient's father died at 18 from complications of dementia and pneumonia.  He had one sister who also died from complications of dementia and pneumonia.  His parents are deceased from non-cancer related issues.  Ms. Pribyl is unaware of previous family history of genetic testing for hereditary cancer risks. Patient's maternal ancestors are of [place] and [place] descent, and paternal ancestors are of [place] descent. There is no reported Ashkenazi Jewish ancestry. There is no known consanguinity.  GENETIC COUNSELING ASSESSMENT: Ms. Bricco is a 70 y.o. female with a personal and family history of breast cancer which is somewhat suggestive of a familial predisposition to cancer given the ages of onset of breast cancer. We, therefore, discussed and recommended the following at today's visit.    DISCUSSION: We discussed that, in general, most cancer is not inherited in families, but instead is sporadic or familial. Sporadic cancers occur by chance and typically happen at older ages (>50 years) as this type of cancer is caused by genetic changes acquired during an individuals lifetime. Some families have more cancers than would be expected by chance; however, the ages or types of cancer are not consistent with a known genetic mutation or known genetic mutations have been ruled out. This type of familial cancer is thought to be due to a combination of multiple genetic, environmental, hormonal, and lifestyle factors. While this combination of factors likely increases the risk of cancer, the exact source of this risk is not currently identifiable or testable.  We discussed that 5 - 10% of breast cancer is hereditary, with  most cases associated with BRCA mutations.  There are other genes that can be associated with hereditary breast cancer syndromes.  These include ATM, CHEK2 and PALB2.  We discussed that testing is beneficial for several reasons including knowing how to follow individuals after completing their treatment, identifying whether potential treatment options such as PARP inhibitors would be beneficial, and understand if other family members could be at risk for cancer and allow them to undergo genetic testing.   We reviewed the characteristics, features and inheritance patterns of hereditary cancer syndromes.  We discussed with Ms. Wight that the personal and family history does not meet insurance or NCCN criteria for genetic testing and, therefore, is not highly consistent with a familial hereditary cancer syndrome.  We feel she is at low risk to harbor a gene mutation associated with such a condition. She is still interested in genetic testing. We also discussed genetic testing, including the appropriate family members to test, the process of testing, insurance coverage and  turn-around-time for results. We discussed the implications of a negative, positive, carrier and/or variant of uncertain significant result. We recommended Ms. Bartmess pursue genetic testing for the CancerNext-Expanded+RNA gene panel.   The CancerNext-Expanded gene panel offered by [facility] and includes sequencing and rearrangement analysis for the following 77 genes: AIP, ALK, APC*, ATM*, AXIN2, BAP1, BARD1, BLM, BMPR1A, BRCA1*, BRCA2*, BRIP1*, CDC73, CDH1*, CDK4, CDKN1B, CDKN2A, CHEK2*, CTNNA1, DICER1, FANCC, FH, FLCN, GALNT12, KIF1B, LZTR1, MAX, MEN1, MET, MLH1*, MSH2*, MSH3, MSH6*, MUTYH*, NBN, NF1*, NF2, NTHL1, PALB2*, PHOX2B, PMS2*, POT1, PRKAR1A, PTCH1, PTEN*, RAD51C*, RAD51D*, RB1, RECQL, RET, SDHA, SDHAF2, SDHB, SDHC, SDHD, SMAD4, SMARCA4, SMARCB1, SMARCE1, STK11, SUFU, TMEM127, TP53*, TSC1, TSC2, VHL and XRCC2 (sequencing and deletion/duplication); EGFR, EGLN1, HOXB13, KIT, MITF, PDGFRA, POLD1, and POLE (sequencing only); EPCAM and GREM1 (deletion/duplication only). DNA and RNA analyses performed for * genes.   Based on Ms. Aldaba's personal and family history of cancer, she does not meet NCCN or Medicare criteria for genetic testing. We discussed that her out of pocket cost may be approximately $100 due to the testing policy of the laboratory.   PLAN: After considering the risks, benefits, and limitations, Ms. Kenton provided informed consent to pursue genetic testing and the blood sample was sent to [facility] for analysis of the CancerNext-Expanded+RNAinsight. Results should be available within approximately 2-3 weeks' time, at which point they will be disclosed by telephone to Ms. Dipalma, as will any additional recommendations warranted by these results. Ms. Panik will receive a summary of her genetic counseling visit and a copy of her results once available. This information will also be available in Epic.   Lastly, we encouraged Ms. Strohmeier  to remain in contact with cancer genetics annually so that we can continuously update the family history and inform her of any changes in cancer genetics and testing that may be of benefit for this family.   Ms. Rallis questions were answered to her satisfaction today. Our contact information was provided should additional questions or concerns arise. Thank you for the referral and allowing [place] to share in the care of your patient.   Jacarie Pate P. Florene Glen, [place], [facility] Licensed, Insurance risk surveyor [email or web address] phone: [phone]  The patient was seen for a total of 30 minutes in face-to-face genetic counseling.  The patient was seen alone.  This patient was discussed with Drs. Magrinat, Lindi Adie and/or Burr Medico who agrees with the above.    _______________________________________________________________________ For Office Staff:  Number of people involved in session: 1 Was  an Intern/ student involved with case: no

## 2021-05-29 ENCOUNTER — Ambulatory Visit
Admission: RE | Admit: 2021-05-29 | Discharge: 2021-05-29 | Disposition: A | Discharge status: Home / Self Care | Payer: Medicare PPO | Source: Ambulatory Visit | Attending: Radiation Oncology | Admitting: Radiation Oncology

## 2021-05-29 DIAGNOSIS — Z510 Encounter for antineoplastic radiation therapy: Secondary | ICD-10-CM | POA: Diagnosis not present

## 2021-05-29 DIAGNOSIS — D0512 Intraductal carcinoma in situ of left breast: Secondary | ICD-10-CM | POA: Diagnosis not present

## 2021-06-01 ENCOUNTER — Ambulatory Visit
Admission: RE | Admit: 2021-06-01 | Discharge: 2021-06-01 | Disposition: A | Discharge status: Home / Self Care | Payer: Medicare PPO | Source: Ambulatory Visit | Attending: Radiation Oncology | Admitting: Radiation Oncology

## 2021-06-01 ENCOUNTER — Other Ambulatory Visit: Payer: Self-pay

## 2021-06-01 DIAGNOSIS — Z510 Encounter for antineoplastic radiation therapy: Secondary | ICD-10-CM | POA: Diagnosis not present

## 2021-06-01 DIAGNOSIS — D0512 Intraductal carcinoma in situ of left breast: Secondary | ICD-10-CM | POA: Diagnosis not present

## 2021-06-02 ENCOUNTER — Encounter: Payer: Self-pay | Admitting: Radiation Oncology

## 2021-06-02 ENCOUNTER — Ambulatory Visit
Admission: RE | Admit: 2021-06-02 | Discharge: 2021-06-02 | Disposition: A | Discharge status: Home / Self Care | Payer: Medicare PPO | Source: Ambulatory Visit | Attending: Radiation Oncology | Admitting: Radiation Oncology

## 2021-06-02 DIAGNOSIS — D0512 Intraductal carcinoma in situ of left breast: Secondary | ICD-10-CM | POA: Diagnosis not present

## 2021-06-02 DIAGNOSIS — Z510 Encounter for antineoplastic radiation therapy: Secondary | ICD-10-CM | POA: Diagnosis not present

## 2021-06-03 ENCOUNTER — Telehealth: Payer: Self-pay | Admitting: *Deleted

## 2021-06-03 NOTE — Telephone Encounter (Signed)
RETURNED PATIENT'S PHONE CALL, SPOKE WITH PATIENT. ?

## 2021-06-16 ENCOUNTER — Telehealth: Payer: Self-pay | Admitting: Genetic Counselor

## 2021-06-16 ENCOUNTER — Encounter: Payer: Self-pay | Admitting: Genetic Counselor

## 2021-06-16 DIAGNOSIS — Z1379 Encounter for other screening for genetic and chromosomal anomalies: Secondary | ICD-10-CM

## 2021-06-16 HISTORY — DX: Encounter for other screening for genetic and chromosomal anomalies: Z13.79

## 2021-06-16 NOTE — Telephone Encounter (Signed)
LM on VM that results are back and to please call. 

## 2021-06-17 ENCOUNTER — Ambulatory Visit: Payer: Self-pay | Admitting: Genetic Counselor

## 2021-06-17 DIAGNOSIS — D0512 Intraductal carcinoma in situ of left breast: Secondary | ICD-10-CM

## 2021-06-17 DIAGNOSIS — Z1379 Encounter for other screening for genetic and chromosomal anomalies: Secondary | ICD-10-CM

## 2021-06-17 NOTE — Telephone Encounter (Signed)
Revealed negative genetic testing.  Discussed that we do not know why she has breast cancer or why there is cancer in the family. It could be due to a different gene that we are not testing, or maybe our current technology may not be able to pick something up.  It will be important for her to keep in contact with genetics to keep up with whether additional testing may be needed. 

## 2021-06-17 NOTE — Progress Notes (Signed)
HPI:  Kelli Saunders was previously seen in the [facility] clinic due to a personal and family history of breast cancer and concerns regarding a hereditary predisposition to cancer. Please refer to our prior cancer genetics clinic note for more information regarding our discussion, assessment and recommendations, at the time. Kelli Saunders recent genetic test results were disclosed to her, as were recommendations warranted by these results. These results and recommendations are discussed in more detail below.  CANCER HISTORY:  Oncology History Overview Note  Cancer Staging Ductal carcinoma in situ (DCIS) of left breast Staging form: Breast, AJCC 8th Edition - Clinical: No stage assigned - Unsigned    Ductal carcinoma in situ (DCIS) of left breast  12/18/2020 Mammogram   Left Diagnostic  IMPRESSION: The new 2.2 cm cluster of linear fine pleomorphic calcifications in the left breast at 1 o'clock in the retroreolar region 1 cm from the nipple. No other significant masses, calcifications, or other findings are seen in either breast.   01/05/2021 Initial Diagnosis   Ductal carcinoma in situ (DCIS) of left breast   06/12/2021 Genetic Testing   Negative genetic testing on the CancerNext-Expanded+RNAinsignt panel.  The report date is June 12, 2021.  The CancerNext-Expanded gene panel offered by [facility] and includes sequencing and rearrangement analysis for the following 77 genes: AIP, ALK, APC*, ATM*, AXIN2, BAP1, BARD1, BLM, BMPR1A, BRCA1*, BRCA2*, BRIP1*, CDC73, CDH1*, CDK4, CDKN1B, CDKN2A, CHEK2*, CTNNA1, DICER1, FANCC, FH, FLCN, GALNT12, KIF1B, LZTR1, MAX, MEN1, MET, MLH1*, MSH2*, MSH3, MSH6*, MUTYH*, NBN, NF1*, NF2, NTHL1, PALB2*, PHOX2B, PMS2*, POT1, PRKAR1A, PTCH1, PTEN*, RAD51C*, RAD51D*, RB1, RECQL, RET, SDHA, SDHAF2, SDHB, SDHC, SDHD, SMAD4, SMARCA4, SMARCB1, SMARCE1, STK11, SUFU, TMEM127, TP53*, TSC1, TSC2, VHL and XRCC2 (sequencing and deletion/duplication);  EGFR, EGLN1, HOXB13, KIT, MITF, PDGFRA, POLD1, and POLE (sequencing only); EPCAM and GREM1 (deletion/duplication only). DNA and RNA analyses performed for * genes.      FAMILY HISTORY:  We obtained a detailed, 4-generation family history.  Significant diagnoses are listed below: Family History  Problem Relation Age of Onset   Hypertension Mother    Mitral valve prolapse Mother    Arthritis Mother    Varicose Veins Mother    Hypertension Father    Alzheimer's disease Father    Breast cancer Maternal Aunt 72   Dementia Paternal Aunt    Pneumonia Paternal Aunt    Diabetes Maternal Grandmother    Kidney failure Maternal Grandfather    Diabetes Paternal Grandfather    Congestive Heart Failure Paternal Grandfather       The patient has a daughter and son who transitioned from female who are cancer free. She has two brothers who are cancer free.  Her mother is living and her father is deceased.   The patient's mother has not been tested for Long QT syndrome.  She has a sister who had breast cancer and died in her 57's and four brothers, one who is known to have Long QT syndrome.  The maternal grandparents are deceased from non-cancer related issues.   The patient's father died at 73 from complications of dementia and pneumonia.  He had one sister who also died from complications of dementia and pneumonia.  His parents are deceased from non-cancer related issues.   Kelli Saunders is unaware of previous family history of genetic testing for hereditary cancer risks. Patient's maternal ancestors are of [place] and [place] descent, and paternal ancestors are of [place] descent. There is no reported Ashkenazi Jewish ancestry. There is no  known consanguinity.  GENETIC TEST RESULTS: Genetic testing reported out on June 12, 2021 through the CancerNext-Expanded+RNAinsight cancer panel found no pathogenic mutations. The CancerNext-Expanded gene panel offered by [facility] and includes  sequencing and rearrangement analysis for the following 77 genes: AIP, ALK, APC*, ATM*, AXIN2, BAP1, BARD1, BLM, BMPR1A, BRCA1*, BRCA2*, BRIP1*, CDC73, CDH1*, CDK4, CDKN1B, CDKN2A, CHEK2*, CTNNA1, DICER1, FANCC, FH, FLCN, GALNT12, KIF1B, LZTR1, MAX, MEN1, MET, MLH1*, MSH2*, MSH3, MSH6*, MUTYH*, NBN, NF1*, NF2, NTHL1, PALB2*, PHOX2B, PMS2*, POT1, PRKAR1A, PTCH1, PTEN*, RAD51C*, RAD51D*, RB1, RECQL, RET, SDHA, SDHAF2, SDHB, SDHC, SDHD, SMAD4, SMARCA4, SMARCB1, SMARCE1, STK11, SUFU, TMEM127, TP53*, TSC1, TSC2, VHL and XRCC2 (sequencing and deletion/duplication); EGFR, EGLN1, HOXB13, KIT, MITF, PDGFRA, POLD1, and POLE (sequencing only); EPCAM and GREM1 (deletion/duplication only). DNA and RNA analyses performed for * genes. The test report has been scanned into EPIC and is located under the Molecular Pathology section of the Results Review tab.  A portion of the result report is included below for reference.     We discussed with Kelli Saunders that because current genetic testing is not perfect, it is possible there may be a gene mutation in one of these genes that current testing cannot detect, but that chance is small.  We also discussed, that there could be another gene that has not yet been discovered, or that we have not yet tested, that is responsible for the cancer diagnoses in the family. It is also possible there is a hereditary cause for the cancer in the family that Kelli Saunders did not inherit and therefore was not identified in her testing.  Therefore, it is important to remain in touch with cancer genetics in the future so that we can continue to offer Kelli Saunders the most up to date genetic testing.   ADDITIONAL GENETIC TESTING: We discussed with Kelli Saunders that her genetic testing was fairly extensive.  If there are genes identified to increase cancer risk that can be analyzed in the future, we would be happy to discuss and coordinate this testing at that time.    CANCER SCREENING  RECOMMENDATIONS: Kelli Saunders test result is considered negative (normal).  This means that we have not identified a hereditary cause for her personal and family history of breast cancer at this time. Most cancers happen by chance and this negative test suggests that her cancer may fall into this category.    While reassuring, this does not definitively rule out a hereditary predisposition to cancer. It is still possible that there could be genetic mutations that are undetectable by current technology. There could be genetic mutations in genes that have not been tested or identified to increase cancer risk.  Therefore, it is recommended she continue to follow the cancer management and screening guidelines provided by her oncology and primary healthcare provider.   An individual's cancer risk and medical management are not determined by genetic test results alone. Overall cancer risk assessment incorporates additional factors, including personal medical history, family history, and any available genetic information that may result in a personalized plan for cancer prevention and surveillance  RECOMMENDATIONS FOR FAMILY MEMBERS:  Individuals in this family might be at some increased risk of developing cancer, over the general population risk, simply due to the family history of cancer.  We recommended women in this family have a yearly mammogram beginning at age 27, or 92 years younger than the earliest onset of cancer, an annual clinical breast exam, and perform monthly breast self-exams. Women in this family should  also have a gynecological exam as recommended by their primary provider. All family members should be referred for colonoscopy starting at age 70.  FOLLOW-UP: Lastly, we discussed with Kelli Saunders that cancer genetics is a rapidly advancing field and it is possible that new genetic tests will be appropriate for her and/or her family members in the future. We encouraged her to remain in  contact with cancer genetics on an annual basis so we can update her personal and family histories and let her know of advances in cancer genetics that may benefit this family.   Our contact number was provided. Kelli Saunders questions were answered to her satisfaction, and she knows she is welcome to call us at anytime with additional questions or concerns.   Roma Kayser, [place], [facility] Licensed, Certified Genetic Counselor [email or web address]

## 2021-06-23 ENCOUNTER — Ambulatory Visit: Payer: Medicare PPO | Admitting: Nurse Practitioner

## 2021-06-23 ENCOUNTER — Telehealth: Payer: Self-pay

## 2021-06-23 NOTE — Telephone Encounter (Signed)
Spoke with Dr. Burr Medico as to when the pt should start taking her Tamoxifen.  Pt had called earlier today wanting to know when she could start taking her medication.  Pt stated she has finished Radiation and is due for follow-up with the Radiation provider in February 2023 and will see Cira Rue, NP in March 2023.  Called pt to let her know Dr. Burr Medico would like for the pt to start taking the Tamoxifen tomorrow 06/24/2021.  Pt verbalized understanding of instructions and has no further questions or concerns at this time.

## 2021-07-02 DIAGNOSIS — R300 Dysuria: Secondary | ICD-10-CM | POA: Diagnosis not present

## 2021-07-09 ENCOUNTER — Ambulatory Visit: Payer: Self-pay | Admitting: Radiation Oncology

## 2021-07-13 DIAGNOSIS — Z6821 Body mass index (BMI) 21.0-21.9, adult: Secondary | ICD-10-CM | POA: Diagnosis not present

## 2021-07-13 DIAGNOSIS — N390 Urinary tract infection, site not specified: Secondary | ICD-10-CM | POA: Diagnosis not present

## 2021-07-20 ENCOUNTER — Ambulatory Visit: Payer: Medicare PPO | Admitting: Radiation Oncology

## 2021-07-20 ENCOUNTER — Encounter: Payer: Self-pay | Admitting: Radiology

## 2021-07-25 NOTE — Progress Notes (Signed)
Radiation Oncology         [phone] ________________________________  Name: Kelli Saunders MRN: [id number]  Date: 07/27/2021  DOB: 02-07-1951  Follow-Up Visit Note  CC: Marco Collie, MD  Truitt Merle, MD    ICD-10-CM   1. 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  The patient tolerated radiation relatively well other than pain, tenderness, itching, and peeling under the left breast. Physical exam performed on 05/26/21 revealed some mild hyperpigmentation changes and erythema in the upper inner aspect of the left breast. I encouraged her to use neosporin to this area. On the date of her final treatment, she reported improvement of her peeling and itching, and some residual moderate erythema was appreciated.          Since she was seen for consultation in October, the patient followed up with Dr. Burr Medico on 05/12/21. During which time, the patient reported that she developed a seroma following her lumpectomy, and also unfortunately had the flu and strep around the same time. The patient reported that she had recovered well since. Antiestrogen treatment options were also discussed with the patient, and given her strongly positive ER and PR and high grade DCIS, Dr. Burr Medico recommend antiestrogen therapy with tamofixen to decrease her risk of future breast cancer by ~40%.   The patient also opted to pursue genetic testing on 05/28/21 which revealed no clinically significant variants detected.   She is doing well at this  time.  She denies any residual fatigue from her radiation treatment.  She denies any itching or discomfort within the left breast area nipple discharge or bleeding.                    Allergies:  is allergic to sulfa antibiotics.  Meds: Current Outpatient Medications  Medication Sig Dispense Refill   acetaminophen (TYLENOL) 650 MG CR tablet Take 650-1,300 mg by mouth every 8 (eight) hours as needed for pain.     bimatoprost (LATISSE) 0.03 % ophthalmic solution Apply 1 application to eye daily. Uses on eye brows, not in eye     Calcium Carb-Cholecalciferol (CALCIUM 600 + D PO) Take 1 tablet by mouth daily.     Coenzyme Q10 (CO Q 10 PO) Take 1 tablet by mouth daily.     loratadine (CLARITIN) 10 MG tablet Take 10 mg by mouth daily as needed for allergies.     metoprolol succinate (TOPROL XL) 25 MG 24 hr tablet Take 0.5 tablets (12.5 mg total) by mouth 3 (three) times a week. (Patient taking differently: Take 12.5 mg by mouth every Monday, Wednesday, and Friday.) 20 tablet 3   Multiple Vitamin (MULTIVITAMIN) tablet Take 1 tablet by mouth daily.     Omega-3 Fatty Acids (FISH OIL PO) Take 1 tablet by mouth daily.     Probiotic Product (FORTIFY PROBIOTIC WOMENS EX ST PO) Take 1 tablet by mouth daily.     tamoxifen (NOLVADEX) 20 MG tablet Take 1 tablet (20 mg total) by mouth daily.  30 tablet 3   UNABLE TO FIND Place 1 capsule into both eyes daily. HydroEye Softgels Dry Eye Relief ( proprietary blend of GLA and omega 3 fatty acids (EPA and DHA)     VITAMIN D PO Take 5,000 Units by mouth daily.     HYDROcodone-acetaminophen (NORCO/VICODIN) 5-325 MG tablet Take 1 tablet by mouth every 6 (six) hours as needed for moderate pain or severe pain. (Patient not taking: Reported on 04/06/2021) 10 tablet 0   No current facility-administered medications for this encounter.    Physical Findings: The patient is in no acute distress. Patient is alert and oriented.  height is 5\' 8"  (1.727 m) and weight is 144 lb 6  oz (65.5 kg). Her temporal temperature is 96.4 F (35.8 C) (abnormal). Her blood pressure is 113/78 and her pulse is 58 (abnormal). Her respiration is 18 and oxygen saturation is 100%. .   Lungs are clear to auscultation bilaterally. Heart has regular rate and rhythm. No palpable cervical, supraclavicular, or axillary adenopathy. Abdomen soft, non-tender, normal bowel sounds.   Left Breast: Skin is healed well.  Very minimal hyperpigmentation changes.  No dominant mass appreciated in the breast nipple discharge or bleeding.   Lab Findings: Lab Results  Component Value Date   WBC 6.4 01/07/2021   HGB 14.8 01/07/2021   HCT 43.6 01/07/2021   MCV 91.0 01/07/2021   PLT 216 01/07/2021    Radiographic Findings: No results found.  Impression: Stage 0 (cTis (DCIS), cN0, cM0) Left Breast, Ductal carcinoma in-situ with a focus of microinvasive carcinoma, ER+ / PR+  She has recovered well from her radiation therapy.  No evidence of recurrence on clinical exam today.  Her only issue is some mild nausea associated with tamoxifen.  Plan: As needed follow-up in radiation oncology.  She will continue close follow-up in medical oncology and continue on adjuvant hormonal therapy as recommended by Dr. Burr Medico.    ____________________________________  Blair Promise, PhD, MD  This document serves as a record of services personally performed by Gery Pray, MD. It was created on his behalf by Roney Mans, a trained medical scribe. The creation of this record is based on the scribe's personal observations and the provider's statements to them. This document has been checked and approved by the attending provider.

## 2021-07-27 ENCOUNTER — Other Ambulatory Visit: Payer: Self-pay

## 2021-07-27 ENCOUNTER — Encounter: Payer: Self-pay | Admitting: Radiation Oncology

## 2021-07-27 ENCOUNTER — Ambulatory Visit
Admission: RE | Admit: 2021-07-27 | Discharge: 2021-07-27 | Disposition: A | Discharge status: Home / Self Care | Payer: Medicare PPO | Source: Ambulatory Visit | Attending: Radiation Oncology | Admitting: Radiation Oncology

## 2021-07-27 VITALS — BP 113/78 | HR 58 | Temp 96.4°F | Resp 18 | Ht 68.0 in | Wt 144.4 lb

## 2021-07-27 DIAGNOSIS — D0512 Intraductal carcinoma in situ of left breast: Secondary | ICD-10-CM | POA: Diagnosis not present

## 2021-07-27 DIAGNOSIS — Z170 Estrogen receptor positive status [ER+]: Secondary | ICD-10-CM | POA: Diagnosis not present

## 2021-07-27 DIAGNOSIS — Z79899 Other long term (current) drug therapy: Secondary | ICD-10-CM | POA: Diagnosis not present

## 2021-07-27 DIAGNOSIS — R110 Nausea: Secondary | ICD-10-CM | POA: Insufficient documentation

## 2021-07-27 DIAGNOSIS — Z79810 Long term (current) use of selective estrogen receptor modulators (SERMs): Secondary | ICD-10-CM | POA: Diagnosis not present

## 2021-07-27 DIAGNOSIS — Z923 Personal history of irradiation: Secondary | ICD-10-CM | POA: Insufficient documentation

## 2021-07-27 NOTE — Progress Notes (Signed)
Kelli Saunders is here today for follow up post radiation to the breast.   Breast Side:left   They completed their radiation on: 06/02/2021   Does the patient complain of any of the following: Post radiation skin issues: denies Breast Tenderness: denies Breast Swelling: denies Lymphadema: denies Range of Motion limitations: full range of motion Fatigue post radiation: denies Appetite good/fair/poor: good  Additional comments if applicable: feels nauseated after taking tamoxifen.  Vitals:   07/27/21 1048  BP: 113/78  Pulse: (!) 58  Resp: 18  Temp: (!) 96.4 F (35.8 C)  TempSrc: Temporal  SpO2: 100%  Weight: 144 lb 6 oz (65.5 kg)  Height: 5\' 8"  (1.727 m)

## 2021-07-28 ENCOUNTER — Ambulatory Visit (INDEPENDENT_AMBULATORY_CARE_PROVIDER_SITE_OTHER): Payer: Medicare PPO | Admitting: Nurse Practitioner

## 2021-07-28 ENCOUNTER — Encounter: Payer: Self-pay | Admitting: Nurse Practitioner

## 2021-07-28 VITALS — BP 122/76 | Ht 67.0 in | Wt 142.0 lb

## 2021-07-28 DIAGNOSIS — Z853 Personal history of malignant neoplasm of breast: Secondary | ICD-10-CM

## 2021-07-28 DIAGNOSIS — Z780 Asymptomatic menopausal state: Secondary | ICD-10-CM

## 2021-07-28 DIAGNOSIS — Z9289 Personal history of other medical treatment: Secondary | ICD-10-CM | POA: Diagnosis not present

## 2021-07-28 DIAGNOSIS — M8589 Other specified disorders of bone density and structure, multiple sites: Secondary | ICD-10-CM

## 2021-07-28 DIAGNOSIS — N342 Other urethritis: Secondary | ICD-10-CM | POA: Diagnosis not present

## 2021-07-28 DIAGNOSIS — Z01419 Encounter for gynecological examination (general) (routine) without abnormal findings: Secondary | ICD-10-CM

## 2021-07-28 DIAGNOSIS — R350 Frequency of micturition: Secondary | ICD-10-CM | POA: Diagnosis not present

## 2021-07-28 MED ORDER — HYDROCORTISONE 1 % EX OINT: 1.0000 "application " | TOPICAL_OINTMENT | Freq: Two times a day (BID) | CUTANEOUS | 0 refills | Status: DC

## 2021-07-28 NOTE — Progress Notes (Signed)
Kelli Saunders 1950-11-08 [id number]   History:  71 y.o. [id number] presents for breast and pelvic exam. Postmenopausal - no HRT, no bleeding. Normal pap history. 12/2020 left DCIS ER/PR positive managed with lumpectomy, radiation, and Tamoxifen. Was recently treated for UTI but is still experiencing urinary frequency/burning and would like urinalysis done today.   Gynecologic History Patient's last menstrual period was 06/15/2003.   Contraception: post menopausal status Sexually active: Yes  Health Maintenance Last Pap: 05/07/2019. Results were: Normal Last mammogram: 12/03/2020. Results were: Left breast mass later diagnosed malignant  Last colonoscopy: 2013. Results were: Normal, 10-year recall Last Dexa: 08/13/2019. Results were: T-score -2.1, FRAX 16% / 2.5%. Scheduled for next week  Past medical history, past surgical history, family history and social history were all reviewed and documented in the EPIC chart. Married. Retired Editor, commissioning. 2 children - daughter lives in [place], has 44 yo son. Son lives in [place].  ROS:  A ROS was performed and pertinent positives and negatives are included.  Exam:  Vitals:   07/28/21 1123  BP: 122/76  Weight: 142 lb (64.4 kg)  Height: 5\' 7"  (1.702 m)   Body mass index is 22.24 kg/m.  General appearance:  Normal Thyroid:  Symmetrical, normal in size, without palpable masses or nodularity. Respiratory  Auscultation:  Clear without wheezing or rhonchi Cardiovascular  Auscultation:  Regular rate, without rubs, murmurs or gallops  Edema/varicosities:  Not grossly evident Abdominal  Soft,nontender, without masses, guarding or rebound.  Liver/spleen:  No organomegaly noted  Hernia:  None appreciated  Skin  Inspection:  Grossly normal Breasts: Examined lying and sitting.   Right: Without masses, retractions, nipple discharge or axillary adenopathy.   Left: Without masses, retractions, nipple discharge or axillary adenopathy. Genitourinary    Inguinal/mons:  Normal without inguinal adenopathy  External genitalia:  Normal appearing vulva with no masses, tenderness, or lesions. Urethral prolapse versus urethritis  BUS/Urethra/Skene's glands:  Normal  Vagina:  Normal appearing with normal color and discharge, no lesions. Atrophic changes.   Cervix:  Normal appearing without discharge or lesions  Uterus:  Normal in size, shape and contour.  Midline and mobile, nontender  Adnexa/parametria:     Rt: Normal in size, without masses or tenderness.   Lt: Normal in size, without masses or tenderness.  Anus and perineum: Normal  Digital rectal exam: Normal sphincter tone without palpated masses or tenderness  Patient informed chaperone available to be present for breast and pelvic exam. Patient has requested no chaperone to be present. Patient has been advised what will be completed during breast and pelvic exam.   Assessment/Plan:  71 y.o. [id number] for breast and pelvic exam.   Well female exam with routine gynecological exam - Education provided on SBEs, importance of preventative screenings, current guidelines, high calcium diet, regular exercise, and multivitamin daily. Labs with PCP.   Postmenopausal - no HRT, no bleeding  Osteopenia of multiple sites - Scheduled for DXA next week by oncology. Continue Vitamin D + Calcium and regular exercise.   History of left breast cancer - 12/2020 left DCIS ER/PR positive managed with lumpectomy, radiation, and Tamoxifen (started last month with plan for 5 years of therapy).   Frequent urination - Plan: Urinalysis,Complete w/RFL Culture. Negative UA. Likely s/t vulvar/urethral irritation.   Atrophic urethritis - Plan: hydrocortisone 1 % ointment BID x 7 days applied to urethra. Symptoms of vulvar irritation begin shortly after starting Tamoxifen. We discussed why this happens. Could also be from recent UTI causing inflammation of  tissue. Recommend using coconut oil daily for hydration after  finishing topical steroid.   Screening for cervical cancer - Normal Pap history. We discussed option to stop screening based on guidelines. She wants to continue and would like one next year.   Screening for colon cancer - 2013 colonoscopy. Due for repeat this year and plans to schedule this soon.   Return in 1 year for breast and pelvic exam.     Tamela Gammon DNP, 11:44 AM 07/28/2021

## 2021-07-30 LAB — URINE CULTURE
MICRO NUMBER:: 13006529
SPECIMEN QUALITY:: ADEQUATE

## 2021-07-30 LAB — URINALYSIS, COMPLETE W/RFL CULTURE
Bilirubin Urine: NEGATIVE
Glucose, UA: NEGATIVE
Hgb urine dipstick: NEGATIVE
Hyaline Cast: NONE SEEN /LPF
Ketones, ur: NEGATIVE
Nitrites, Initial: NEGATIVE
Protein, ur: NEGATIVE
RBC / HPF: NONE SEEN /HPF (ref 0–2)
Specific Gravity, Urine: 1.007 (ref 1.001–1.035)
pH: 6.5 (ref 5.0–8.0)

## 2021-07-30 LAB — CULTURE INDICATED

## 2021-08-11 DIAGNOSIS — C50912 Malignant neoplasm of unspecified site of left female breast: Secondary | ICD-10-CM | POA: Insufficient documentation

## 2021-08-11 DIAGNOSIS — C50919 Malignant neoplasm of unspecified site of unspecified female breast: Secondary | ICD-10-CM | POA: Insufficient documentation

## 2021-08-11 NOTE — Progress Notes (Signed)
CLINIC:  Survivorship   Patient Care Team: Marco Collie, MD as PCP - General (Family Medicine) Karma Ganja, NP as Nurse Practitioner (Gynecology) Mauro Kaufmann, RN as Oncology Nurse Navigator Rockwell Germany, RN as Oncology Nurse Navigator Truitt Merle, MD as Consulting Physician (Hematology) Jovita Kussmaul, MD as Consulting Physician (General Surgery) Gery Pray, MD as Consulting Physician (Radiation Oncology) Richardo Priest, MD as Consulting Physician (Cardiology) Landis Martins, DPM as Consulting Physician (Podiatry) Sydnee Levans, MD as Referring Physician (Dermatology) Alla Feeling, NP as Nurse Practitioner (Nurse Practitioner)   REASON FOR VISIT:  Routine follow-up post-treatment for a recent history of breast cancer.  BRIEF ONCOLOGIC HISTORY:  Oncology History Overview Note  Cancer Staging Ductal carcinoma in situ (DCIS) of left breast Staging form: Breast, AJCC 8th Edition - Clinical: No stage assigned - Unsigned    Ductal carcinoma in situ (DCIS) of left breast  12/18/2020 Mammogram   Left Diagnostic  IMPRESSION: The new 2.2 cm cluster of linear fine pleomorphic calcifications in the left breast at 1 o'clock in the retroreolar region 1 cm from the nipple. No other significant masses, calcifications, or other findings are seen in either breast.   01/05/2021 Initial Diagnosis   Ductal carcinoma in situ (DCIS) of left breast   02/25/2021 Definitive Surgery   Left breast lumpectomy with radioactive seed localization and deep left axillary sentinel lymph node biopsy by Dr. Autumn Messing   02/25/2021 Pathology Results   FINAL MICROSCOPIC DIAGNOSIS:  A. BREAST, LEFT, LUMPECTOMY:  - High-grade ductal carcinoma in situ, 1.5 cm, with a focus suspicious for microinvasive carcinoma.  See comment  - Resection margins are negative for carcinoma; anterior margin 2 mm from microinvasive carcinoma  - Biopsy site changes  - See oncology table  B. LYMPH NODE, LEFT #1,  SENTINEL, EXCISION:  - Lymph node, negative for carcinoma (0/1)  C. LYMPH NODE, LEFT, SENTINEL, EXCISION:  - Lymph node, negative for carcinoma (0/1)  D. LYMPH NODE, LEFT, SENTINEL, EXCISION:  - Lymph node, negative for carcinoma (0/1)  E. LYMPH NODE, LEFT, SENTINEL, EXCISION:  - Lymph node, negative for carcinoma (0/1)  F. LYMPH NODE, LEFT, SENTINEL, EXCISION:  - Lymph node, negative for carcinoma (0/1)  G. LYMPH NODE, LEFT #2, SENTINEL, EXCISION:  - Lymph node, negative for carcinoma (0/1)  H. LYMPH NODE, LEFT #3, SENTINEL, EXCISION:  - Lymph node, negative for carcinoma (0/1)    04/13/2021 - 06/02/2021 Radiation Therapy   Per Dr. Sondra Come radiation treatment dates:  04/13/21 through 06/02/21 Intent: Curative Site/dose: Left breast: 40.05 Gy delivered in 15 fractions of 2.67 Gy/Fx Left breast boost: 12.00 Gy delivered in 6 fractions of 2.00 Gy/Fx   06/12/2021 Genetic Testing   Negative genetic testing on the CancerNext-Expanded+RNAinsignt panel.  The report date is June 12, 2021.  The CancerNext-Expanded gene panel offered by [facility] and includes sequencing and rearrangement analysis for the following 77 genes: AIP, ALK, APC*, ATM*, AXIN2, BAP1, BARD1, BLM, BMPR1A, BRCA1*, BRCA2*, BRIP1*, CDC73, CDH1*, CDK4, CDKN1B, CDKN2A, CHEK2*, CTNNA1, DICER1, FANCC, FH, FLCN, GALNT12, KIF1B, LZTR1, MAX, MEN1, MET, MLH1*, MSH2*, MSH3, MSH6*, MUTYH*, NBN, NF1*, NF2, NTHL1, PALB2*, PHOX2B, PMS2*, POT1, PRKAR1A, PTCH1, PTEN*, RAD51C*, RAD51D*, RB1, RECQL, RET, SDHA, SDHAF2, SDHB, SDHC, SDHD, SMAD4, SMARCA4, SMARCB1, SMARCE1, STK11, SUFU, TMEM127, TP53*, TSC1, TSC2, VHL and XRCC2 (sequencing and deletion/duplication); EGFR, EGLN1, HOXB13, KIT, MITF, PDGFRA, POLD1, and POLE (sequencing only); EPCAM and GREM1 (deletion/duplication only). DNA and RNA analyses performed for * genes.    06/2021 -  Anti-estrogen oral therapy   Adjuvant tamoxifen 20 mg p.o. once daily   08/12/2021 Survivorship   SCP  delivered by Cira Rue, NP     INTERVAL HISTORY:  Kelli Saunders presents to the [facility] today for our initial meeting to review her survivorship care plan detailing her treatment course for breast cancer, as well as monitoring long-term side effects of that treatment, education regarding health maintenance, screening, and overall wellness and health promotion.     Overall, Kelli Saunders reports doing well since completing her radiation therapy, skin has healed well.  She began tamoxifen, tolerating well for the most part with some mild nausea in the morning (she takes it at night), and hot/cold fluctuations.  Denies bone or joint pain, vaginal bleeding, breast concerns, or any other new specific complaints.  She saw OB/GYN 07/28/2021 who did pelvic/Pap and breast exam.   ONCOLOGY TREATMENT TEAM:  1. Surgeon:  Dr. Marlou Starks at [facility] 2. Medical Oncologist: Dr. Burr Medico 3. Radiation Oncologist: Dr. Sondra Come    PAST MEDICAL/SURGICAL HISTORY:  Past Medical History:  Diagnosis Date   Allergy 10/15/1958   Breast cancer ([place]) 12/30/2020   Family history of breast cancer    Family history of long QT syndrome 12/01/2017   Heart murmur 1981   History of radiation therapy    left breast - 04/23/2021-06/02/2021  Dr Gery Pray   Hx of transfusion of packed red blood cells 1981   after childbirth   Hyperlipidemia 12/01/2017   Left atrial enlargement 12/27/2014   Mitral valve prolapse 12/01/2017   MVP (mitral valve prolapse)    Osteopenia    Palpitation 12/26/2014   Pectus excavatum 12/01/2017   Thoracic scoliosis 12/01/2017   Wrist fracture, right 07/2013   slipped on ice   Past Surgical History:  Procedure Laterality Date   BREAST LUMPECTOMY WITH RADIOACTIVE SEED AND SENTINEL LYMPH NODE BIOPSY Left 02/25/2021   Procedure: LEFT BREAST LUMPECTOMY WITH RADIOACTIVE SEED AND SENTINEL LYMPH NODE BIOPSY;  Surgeon: Jovita Kussmaul, MD;  Location: [place];   Service: General;  Laterality: Left;   DRAINAGE AND CLOSURE OF LYMPHOCELE Left 03/19/2021   Procedure: Placement drain left axillary seroma;  Surgeon: Jovita Kussmaul, MD;  Location: [place];  Service: General;  Laterality: Left;   L5 discectomy  2007   SPINE SURGERY  2007   TUBAL LIGATION  1993     ALLERGIES:  Allergies  Allergen Reactions   Sulfa Antibiotics Itching     CURRENT MEDICATIONS:  Outpatient Encounter Medications as of 08/12/2021  Medication Sig   acetaminophen (TYLENOL) 650 MG CR tablet Take 650-1,300 mg by mouth every 8 (eight) hours as needed for pain.   bimatoprost (LATISSE) 0.03 % ophthalmic solution Apply 1 application to eye daily. Uses on eye brows, not in eye   Calcium Carb-Cholecalciferol (CALCIUM 600 + D PO) Take 1 tablet by mouth daily.   Coenzyme Q10 (CO Q 10 PO) Take 1 tablet by mouth daily.   hydrocortisone 1 % ointment Apply 1 application topically 2 (two) times daily.   loratadine (CLARITIN) 10 MG tablet Take 10 mg by mouth daily as needed for allergies.   metoprolol succinate (TOPROL XL) 25 MG 24 hr tablet Take 0.5 tablets (12.5 mg total) by mouth 3 (three) times a week. (Patient taking differently: Take 12.5 mg by mouth every Monday, Wednesday, and Friday.)   Multiple Vitamin (MULTIVITAMIN) tablet Take 1 tablet by mouth daily.   Omega-3 Fatty Acids (FISH OIL PO)  Take 1 tablet by mouth daily.   Probiotic Product (FORTIFY PROBIOTIC WOMENS EX ST PO) Take 1 tablet by mouth daily.   tamoxifen (NOLVADEX) 20 MG tablet Take 1 tablet (20 mg total) by mouth daily.   UNABLE TO FIND Place 1 capsule into both eyes daily. HydroEye Softgels Dry Eye Relief ( proprietary blend of GLA and omega 3 fatty acids (EPA and DHA)   VITAMIN D PO Take 5,000 Units by mouth daily.   [DISCONTINUED] tamoxifen (NOLVADEX) 20 MG tablet Take 1 tablet (20 mg total) by mouth daily.   No facility-administered encounter medications on file as of 08/12/2021.     ONCOLOGIC FAMILY HISTORY:   Family History  Problem Relation Age of Onset   Hypertension Mother    Mitral valve prolapse Mother    Arthritis Mother    Varicose Veins Mother    Hypertension Father    Alzheimer's disease Father    Breast cancer Maternal Aunt 36   Dementia Paternal Aunt    Pneumonia Paternal Aunt    Diabetes Maternal Grandmother    Kidney failure Maternal Grandfather    Diabetes Paternal Grandfather    Congestive Heart Failure Paternal Grandfather      GENETIC COUNSELING/TESTING: Yes, negative  SOCIAL HISTORY:  Kelli Saunders is denies any current or history of tobacco, alcohol, or illicit drug use.     PHYSICAL EXAMINATION:  Vital Signs:   Vitals:   08/12/21 1233  BP: 118/68  Pulse: 65  Resp: 16  Temp: 98.1 F (36.7 C)  SpO2: 100%   Filed Weights   08/12/21 1233  Weight: 144 lb (65.3 kg)   General: Well-nourished, well-appearing female in no acute distress.   HEENT: Sclerae anicteric.  Respiratory: breathing non-labored.  Neuro: No focal deficits. Steady gait.  Psych: Mood and affect normal and appropriate for situation.  Extremities: No edema. Breast exam: Limited to visualization given no complaints.  No significant left breast hyperpigmentation or erythema, areolar and axillary incisions have healed well  LABORATORY DATA:  None for this visit.  DIAGNOSTIC IMAGING:  None for this visit.      ASSESSMENT AND PLAN:  Ms.. Saunders is a pleasant 71 y.o. 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  She presents to the Survivorship Clinic for our initial meeting and routine follow-up post-completion of treatment for breast cancer.    1. Stage 0 left breast DCIS:  Kelli Saunders has recovered well from definitive treatment for breast cancer. She will follow-up with her medical oncologist, Dr. Burr Medico on 11/12/2021  with history and physical exam per surveillance protocol.  She will continue her anti-estrogen therapy with tamoxifen. Thus far, she is tolerating well, with mild hot flashes and nausea.  She was instructed to make Dr. Burr Medico or myself aware if she begins to experience any worsening side effects of the medication and I could see her back in clinic to help manage those side effects, as needed. Though the incidence is low, there is an associated risk of endometrial cancer with anti-estrogen therapies like Tamoxifen.  Kelli Saunders was encouraged to contact Dr. Burr Medico or myself with any vaginal bleeding while taking Tamoxifen.  She recently saw gynecology.  Other side effects of Tamoxifen were again reviewed with her as well. Today, a comprehensive survivorship care plan and treatment summary was reviewed with the patient today detailing her breast cancer diagnosis, treatment course, potential  late/long-term effects of treatment, appropriate follow-up care with recommendations for the future, and patient education resources.  A copy of this summary, along with a letter will be sent to the patients primary care provider via mail/fax/In Basket message after todays visit.    2. Bone health:  Given Kelli Saunders's age/history of breast cancer and osteopenia, tamoxifen was selected as her preferred antiestrogen therapy. Will monitor on tamoxifen. Next can be done 08/2021 if not done recently. In the meantime, she was encouraged to continue calcium and vitamin D, as well as increase her weight-bearing activities.  She was given education on specific activities to promote bone health.  3. Cancer screening:  Due to Kelli Saunders's history and her age, she should receive screening for skin cancers, colon cancer, and gynecologic cancers.  She is on a cancellation list for Derm follow-up.  Last colonoscopy in 2013 by Dr. Melina Copa in [place], she is requesting referral to [facility] for next colonoscopy 10/2021, I have referred  her.  She recently saw gynecology who performed pelvic/Pap smear. The information and recommendations are listed on the patient's comprehensive care plan/treatment summary and were reviewed in detail with the patient.    4. Health maintenance and wellness promotion: Kelli Saunders was encouraged to consume 5-7 servings of fruits and vegetables per day. She was also encouraged to engage in moderate to vigorous exercise for 30 minutes per day most days of the week. We discussed the [employer] fitness program, which is designed for cancer survivors to help them become more physically fit after cancer treatments.  She was instructed to limit her alcohol consumption and continue to abstain from tobacco use.    5. Support services/counseling: It is not uncommon for this period of the patient's cancer care trajectory to be one of many emotions and stressors.  We discussed an opportunity for her to participate in the next session of [facility] ("Finding Your New Normal") support group series designed for patients after they have completed treatment.   Kelli Saunders was encouraged to take advantage of our many other support services programs, support groups, and/or counseling in coping with her new life as a cancer survivor after completing anti-cancer treatment.  She was offered support today through active listening and expressive supportive counseling.  She was given information regarding our available services and encouraged to contact me with any questions or for help enrolling in any of our support group/programs.    Dispo:   -Return to cancer center 11/12/2021 -Bilateral diagnostic mammogram due in 11/2021 [facility]) -Follow up with surgery as indicated -Referral to [facility] for screening colonoscopy 10/2021, previously done with Dr. Melina Copa in [place] tamoxifen, refilled -She is welcome to return back to the Survivorship Clinic at any time; no additional follow-up needed at this time.  -Consider  referral back to survivorship as a long-term survivor for continued surveillance  Orders Placed This Encounter  Procedures   MM DIAG BREAST TOMO BILATERAL    Standing Status:   Future    Standing Expiration Date:   08/12/2022    Scheduling Instructions:     [facility]    Order Specific Question:   Reason for Exam (SYMPTOM  OR DIAGNOSIS REQUIRED)    Answer:   L breast DCIS 6/22    Order Specific Question:   Preferred imaging location?    Answer:   External   Ambulatory referral to Gastroenterology    Referral Priority:   Routine    Referral Type:   Consultation  Referral Reason:   Specialty Services Required    Number of Visits Requested:   1     A total of (30) minutes of face-to-face time was spent with this patient with greater than 50% of that time in counseling and care-coordination.   Cira Rue, NP Survivorship Program [facility] [phone]   Note: PRIMARY CARE PROVIDER Marco Collie, [place] [phone]

## 2021-08-12 ENCOUNTER — Encounter: Payer: Self-pay | Admitting: Nurse Practitioner

## 2021-08-12 ENCOUNTER — Inpatient Hospital Stay: Payer: Medicare PPO | Attending: Hematology | Admitting: Nurse Practitioner

## 2021-08-12 ENCOUNTER — Other Ambulatory Visit: Payer: Self-pay

## 2021-08-12 VITALS — BP 118/68 | HR 65 | Temp 98.1°F | Resp 16 | Ht 67.0 in | Wt 144.0 lb

## 2021-08-12 DIAGNOSIS — I341 Nonrheumatic mitral (valve) prolapse: Secondary | ICD-10-CM | POA: Diagnosis not present

## 2021-08-12 DIAGNOSIS — D0512 Intraductal carcinoma in situ of left breast: Secondary | ICD-10-CM | POA: Insufficient documentation

## 2021-08-12 DIAGNOSIS — Z923 Personal history of irradiation: Secondary | ICD-10-CM | POA: Insufficient documentation

## 2021-08-12 DIAGNOSIS — R110 Nausea: Secondary | ICD-10-CM | POA: Insufficient documentation

## 2021-08-12 DIAGNOSIS — E785 Hyperlipidemia, unspecified: Secondary | ICD-10-CM | POA: Diagnosis not present

## 2021-08-12 DIAGNOSIS — M129 Arthropathy, unspecified: Secondary | ICD-10-CM | POA: Insufficient documentation

## 2021-08-12 DIAGNOSIS — Z79810 Long term (current) use of selective estrogen receptor modulators (SERMs): Secondary | ICD-10-CM | POA: Insufficient documentation

## 2021-08-12 DIAGNOSIS — Z170 Estrogen receptor positive status [ER+]: Secondary | ICD-10-CM | POA: Diagnosis not present

## 2021-08-12 DIAGNOSIS — Z803 Family history of malignant neoplasm of breast: Secondary | ICD-10-CM | POA: Insufficient documentation

## 2021-08-12 DIAGNOSIS — Z1211 Encounter for screening for malignant neoplasm of colon: Secondary | ICD-10-CM | POA: Diagnosis not present

## 2021-08-12 DIAGNOSIS — I10 Essential (primary) hypertension: Secondary | ICD-10-CM | POA: Diagnosis not present

## 2021-08-12 DIAGNOSIS — Z79899 Other long term (current) drug therapy: Secondary | ICD-10-CM | POA: Insufficient documentation

## 2021-08-12 DIAGNOSIS — C50112 Malignant neoplasm of central portion of left female breast: Secondary | ICD-10-CM

## 2021-08-12 DIAGNOSIS — M8580 Other specified disorders of bone density and structure, unspecified site: Secondary | ICD-10-CM | POA: Diagnosis not present

## 2021-08-12 MED ORDER — TAMOXIFEN CITRATE 20 MG PO TABS: 20.0000 mg | ORAL_TABLET | Freq: Every day | ORAL | 3 refills | Status: DC

## 2021-08-14 ENCOUNTER — Encounter: Payer: Self-pay | Admitting: Hematology

## 2021-08-14 DIAGNOSIS — M8589 Other specified disorders of bone density and structure, multiple sites: Secondary | ICD-10-CM | POA: Diagnosis not present

## 2021-08-14 DIAGNOSIS — Z780 Asymptomatic menopausal state: Secondary | ICD-10-CM | POA: Diagnosis not present

## 2021-08-19 ENCOUNTER — Encounter: Payer: Self-pay | Admitting: Gastroenterology

## 2021-09-10 DIAGNOSIS — L814 Other melanin hyperpigmentation: Secondary | ICD-10-CM | POA: Diagnosis not present

## 2021-09-10 DIAGNOSIS — L821 Other seborrheic keratosis: Secondary | ICD-10-CM | POA: Diagnosis not present

## 2021-09-10 DIAGNOSIS — Z85828 Personal history of other malignant neoplasm of skin: Secondary | ICD-10-CM | POA: Diagnosis not present

## 2021-09-10 DIAGNOSIS — D225 Melanocytic nevi of trunk: Secondary | ICD-10-CM | POA: Diagnosis not present

## 2021-09-10 DIAGNOSIS — L905 Scar conditions and fibrosis of skin: Secondary | ICD-10-CM | POA: Diagnosis not present

## 2021-09-10 DIAGNOSIS — Z419 Encounter for procedure for purposes other than remedying health state, unspecified: Secondary | ICD-10-CM | POA: Diagnosis not present

## 2021-09-24 DIAGNOSIS — H524 Presbyopia: Secondary | ICD-10-CM | POA: Diagnosis not present

## 2021-09-24 DIAGNOSIS — H25813 Combined forms of age-related cataract, bilateral: Secondary | ICD-10-CM | POA: Diagnosis not present

## 2021-09-30 ENCOUNTER — Encounter (HOSPITAL_COMMUNITY): Payer: Self-pay

## 2021-10-15 ENCOUNTER — Ambulatory Visit (AMBULATORY_SURGERY_CENTER): Payer: Medicare PPO | Admitting: *Deleted

## 2021-10-15 VITALS — Ht 67.0 in | Wt 144.0 lb

## 2021-10-15 DIAGNOSIS — Z1211 Encounter for screening for malignant neoplasm of colon: Secondary | ICD-10-CM

## 2021-10-15 NOTE — Progress Notes (Signed)
Patient's pre-visit was done today over the phone with the patient. Name,DOB and address verified. Patient denies any allergies to Eggs and Soy. Patient denies any problems with anesthesia/sedation. Patient is not taking any diet pills or blood thinners. No home Oxygen. Insurance confirmed with patient. Patient denies constipation.  ? ?Prep instructions sent to pt's MyChart-pt is aware. Patient understands to call us back with any questions or concerns. Patient is aware of our care-partner policy.  ? ?EMMI education assigned to the patient for the procedure, sent to [place].  ? ?The patient is COVID-19 vaccinated.   ?

## 2021-10-19 ENCOUNTER — Other Ambulatory Visit: Payer: Self-pay

## 2021-10-19 ENCOUNTER — Telehealth: Payer: Self-pay | Admitting: Cardiology

## 2021-10-19 DIAGNOSIS — E785 Hyperlipidemia, unspecified: Secondary | ICD-10-CM

## 2021-10-19 NOTE — Telephone Encounter (Signed)
Pt called to schedule annual f/u with Dr. Bettina Gavia. Pt's appt is for 03/12/22, she states that she normally has fasting labs done prior to her appt date. Pt would like for lab order to be put in before appt date. Please advise ?

## 2021-10-19 NOTE — Telephone Encounter (Signed)
Lipid panel order entered for upcoming appt. Pt aware.  ?

## 2021-10-27 DIAGNOSIS — D0512 Intraductal carcinoma in situ of left breast: Secondary | ICD-10-CM | POA: Diagnosis not present

## 2021-10-29 ENCOUNTER — Other Ambulatory Visit: Payer: Self-pay

## 2021-10-29 NOTE — Progress Notes (Signed)
Epic faxed Diagnostic Mammogram orders to [facility] [phone]).  Fax confirmation received.

## 2021-10-30 ENCOUNTER — Encounter: Payer: Self-pay | Admitting: Gastroenterology

## 2021-11-04 ENCOUNTER — Ambulatory Visit (AMBULATORY_SURGERY_CENTER): Payer: Medicare PPO | Admitting: Gastroenterology

## 2021-11-04 ENCOUNTER — Encounter: Payer: Self-pay | Admitting: Gastroenterology

## 2021-11-04 VITALS — BP 129/77 | HR 53 | Temp 95.5°F | Resp 10 | Ht 67.0 in | Wt 144.0 lb

## 2021-11-04 DIAGNOSIS — Z1211 Encounter for screening for malignant neoplasm of colon: Secondary | ICD-10-CM

## 2021-11-04 MED ORDER — SODIUM CHLORIDE 0.9 % IV SOLN: 500.0000 mL | Freq: Once | INTRAVENOUS | Status: DC

## 2021-11-04 NOTE — Progress Notes (Signed)
HPI: This is a woman at routine risk for CRC   ROS: complete GI ROS as described in HPI, all other review negative.  Constitutional:  No unintentional weight loss   Past Medical History:  Diagnosis Date   Allergy 10/15/1957   Breast cancer ([place]) 12/30/2020   left   Family history of breast cancer    Family history of long QT syndrome 12/01/2017   Heart murmur 1981   History of radiation therapy    left breast - 04/23/2021-06/02/2021  Dr Gery Pray   Hx of transfusion of packed red blood cells 1981   after childbirth   Hyperlipidemia 12/01/2017   Left atrial enlargement 12/27/2014   Mitral valve prolapse 12/01/2017   MVP (mitral valve prolapse)    Osteopenia    Palpitation 12/26/2014   Pectus excavatum 12/01/2017   Thoracic scoliosis 12/01/2017   Wrist fracture, right 07/2013   slipped on ice    Past Surgical History:  Procedure Laterality Date   BREAST LUMPECTOMY WITH RADIOACTIVE SEED AND SENTINEL LYMPH NODE BIOPSY Left 02/25/2021   Procedure: LEFT BREAST LUMPECTOMY WITH RADIOACTIVE SEED AND SENTINEL LYMPH NODE BIOPSY;  Surgeon: Jovita Kussmaul, MD;  Location: [place];  Service: General;  Laterality: Left;   COLONOSCOPY  2013   Dr.Butler-normal   DRAINAGE AND CLOSURE OF LYMPHOCELE Left 03/19/2021   Procedure: Placement drain left axillary seroma;  Surgeon: Jovita Kussmaul, MD;  Location: [place];  Service: General;  Laterality: Left;   L5 discectomy  2007   SPINE SURGERY  2007   TUBAL LIGATION  1993    Current Outpatient Medications  Medication Sig Dispense Refill   acetaminophen (TYLENOL) 650 MG CR tablet Take 650-1,300 mg by mouth every 8 (eight) hours as needed for pain.     bimatoprost (LATISSE) 0.03 % ophthalmic solution Apply 1 application to eye daily. Uses on eye brows, not in eye     loratadine (CLARITIN) 10 MG tablet Take 10 mg by mouth daily as needed for allergies.     metoprolol succinate (TOPROL XL) 25 MG 24 hr tablet Take 0.5 tablets  (12.5 mg total) by mouth 3 (three) times a week. (Patient taking differently: Take 12.5 mg by mouth every Monday, Wednesday, and Friday.) 20 tablet 3   tamoxifen (NOLVADEX) 20 MG tablet Take 1 tablet (20 mg total) by mouth daily. 30 tablet 3   UNABLE TO FIND Place 1 capsule into both eyes daily. HydroEye Softgels Dry Eye Relief ( proprietary blend of GLA and omega 3 fatty acids (EPA and DHA)     Calcium Carb-Cholecalciferol (CALCIUM 600 + D PO) Take 1 tablet by mouth daily.     Coenzyme Q10 (CO Q 10 PO) Take 1 tablet by mouth daily.     Multiple Vitamin (MULTIVITAMIN) tablet Take 1 tablet by mouth daily.     Omega-3 Fatty Acids (FISH OIL PO) Take 1 tablet by mouth daily.     Probiotic Product (FORTIFY PROBIOTIC WOMENS EX ST PO) Take 1 tablet by mouth daily.     VITAMIN D PO Take 5,000 Units by mouth daily.     Current Facility-Administered Medications  Medication Dose Route Frequency Provider Last Rate Last Admin   0.9 %  sodium chloride infusion  500 mL Intravenous Once Milus Banister, MD        Allergies as of 11/04/2021 - Review Complete 11/04/2021  Allergen Reaction Noted   Sulfa antibiotics Itching 12/07/2012    Family History  Problem Relation  Age of Onset   Hypertension Mother    Mitral valve prolapse Mother    Arthritis Mother    Varicose Veins Mother    Hypertension Father    Alzheimer's disease Father    Breast cancer Maternal Aunt 24   Dementia Paternal Aunt    Pneumonia Paternal Aunt    Diabetes Maternal Grandmother    Kidney failure Maternal Grandfather    Diabetes Paternal Grandfather    Congestive Heart Failure Paternal Grandfather    Colon cancer Neg Hx    Esophageal cancer Neg Hx    Rectal cancer Neg Hx    Stomach cancer Neg Hx     Social History   Socioeconomic History   Marital status: Married    Spouse name: Not on file   Number of children: 2   Years of education: Not on file   Highest education level: Not on file  Occupational History   Not on  file  Tobacco Use   Smoking status: Never   Smokeless tobacco: Never  Vaping Use   Vaping Use: Never used  Substance and Sexual Activity   Alcohol use: Yes    Alcohol/week: 3.0 standard drinks    Types: 3 Glasses of wine per week   Drug use: No   Sexual activity: Yes    Partners: Male    Birth control/protection: Surgical    Comment: BTL-1st intercourse 71 yo-Fewer than 5 partners  Other Topics Concern   Not on file  Social History Narrative   Not on file   Social Determinants of Health   Financial Resource Strain: Not on file  Food Insecurity: Not on file  Transportation Needs: Not on file  Physical Activity: Not on file  Stress: Not on file  Social Connections: Not on file  Intimate Partner Violence: Not on file     Physical Exam: BP (!) 104/58   Pulse 62   Temp (!) 95.5 F (35.3 C) (Skin)   Ht '5\' 7"'$  (1.702 m)   Wt 144 lb (65.3 kg)   LMP 06/15/2003   SpO2 94%   BMI 22.55 kg/m  Constitutional: generally well-appearing Psychiatric: alert and oriented x3 Lungs: CTA bilaterally Heart: no MCR  Assessment and plan: 71 y.o. female with routine risk for CRC  Screening colonoscopy today  Care is appropriate for the ambulatory setting.  Owens Loffler, MD [facility] 11/04/2021, 9:41 AM

## 2021-11-04 NOTE — Progress Notes (Signed)
VS by DT  Pt's states no medical or surgical changes since previsit or office visit.  

## 2021-11-04 NOTE — Progress Notes (Signed)
To pacu, VSS. Report to [email or web address] 

## 2021-11-04 NOTE — Patient Instructions (Signed)
Handout provided on diverticulosis.   YOU HAD AN ENDOSCOPIC PROCEDURE TODAY AT THE [facility]:   Refer to the procedure report that was given to you for any specific questions about what was found during the examination.  If the procedure report does not answer your questions, please call your gastroenterologist to clarify.  If you requested that your care partner not be given the details of your procedure findings, then the procedure report has been included in a sealed envelope for you to review at your convenience later.  YOU SHOULD EXPECT: Some feelings of bloating in the abdomen. Passage of more gas than usual.  Walking can help get rid of the air that was put into your GI tract during the procedure and reduce the bloating. If you had a lower endoscopy (such as a colonoscopy or flexible sigmoidoscopy) you may notice spotting of blood in your stool or on the toilet paper. If you underwent a bowel prep for your procedure, you may not have a normal bowel movement for a few days.  Please Note:  You might notice some irritation and congestion in your nose or some drainage.  This is from the oxygen used during your procedure.  There is no need for concern and it should clear up in a day or so.  SYMPTOMS TO REPORT IMMEDIATELY:  Following lower endoscopy (colonoscopy or flexible sigmoidoscopy):  Excessive amounts of blood in the stool  Significant tenderness or worsening of abdominal pains  Swelling of the abdomen that is new, acute  Fever of 100F or higher  For urgent or emergent issues, a gastroenterologist can be reached at any hour by calling [phone]. Do not use MyChart messaging for urgent concerns.    DIET:  We do recommend a small meal at first, but then you may proceed to your regular diet.  Drink plenty of fluids but you should avoid alcoholic beverages for 24 hours.  ACTIVITY:  You should plan to take it easy for the rest of today and you should NOT DRIVE or use  heavy machinery until tomorrow (because of the sedation medicines used during the test).    FOLLOW UP: Our staff will call the number listed on your records 48-72 hours following your procedure to check on you and address any questions or concerns that you may have regarding the information given to you following your procedure. If we do not reach you, we will leave a message.  We will attempt to reach you two times.  During this call, we will ask if you have developed any symptoms of COVID 19. If you develop any symptoms (ie: fever, flu-like symptoms, shortness of breath, cough etc.) before then, please call [phone].  If you test positive for Covid 19 in the 2 weeks post procedure, please call and report this information to us.    If any biopsies were taken you will be contacted by phone or by letter within the next 1-3 weeks.  Please call us at [phone] if you have not heard about the biopsies in 3 weeks.    SIGNATURES/CONFIDENTIALITY: You and/or your care partner have signed paperwork which will be entered into your electronic medical record.  These signatures attest to the fact that that the information above on your After Visit Summary has been reviewed and is understood.  Full responsibility of the confidentiality of this discharge information lies with you and/or your care-partner.  

## 2021-11-04 NOTE — Op Note (Signed)
[facility] Patient Name: Kelli Saunders Procedure Date: 11/04/2021 10:09 AM MRN: [id number] Endoscopist: Milus Banister , MD Age: 71 Referring MD:  Date of Birth: 1951/06/02 Gender: Female Account #: [id number] Procedure:                Colonoscopy Indications:              Screening for colorectal malignant neoplasm Medicines:                Monitored Anesthesia Care Procedure:                Pre-Anesthesia Assessment:                           - Prior to the procedure, a History and Physical                            was performed, and patient medications and                            allergies were reviewed. The patient's tolerance of                            previous anesthesia was also reviewed. The risks                            and benefits of the procedure and the sedation                            options and risks were discussed with the patient.                            All questions were answered, and informed consent                            was obtained. Prior Anticoagulants: The patient has                            taken no previous anticoagulant or antiplatelet                            agents. ASA Grade Assessment: II - A patient with                            mild systemic disease. After reviewing the risks                            and benefits, the patient was deemed in                            satisfactory condition to undergo the procedure.                           After obtaining informed consent, the colonoscope  was passed under direct vision. Throughout the                            procedure, the patient's blood pressure, pulse, and                            oxygen saturations were monitored continuously. The                            Olympus PCF-H190DL (#[id number]) Colonoscope was                            introduced through the anus and advanced to the the                            cecum,  identified by appendiceal orifice and                            ileocecal valve. The colonoscopy was performed                            without difficulty. The patient tolerated the                            procedure well. The quality of the bowel                            preparation was good. The ileocecal valve,                            appendiceal orifice, and rectum were photographed. Scope In: 10:13:31 AM Scope Out: 10:29:03 AM Scope Withdrawal Time: 0 hours 11 minutes 28 seconds  Total Procedure Duration: 0 hours 15 minutes 32 seconds  Findings:                 Multiple small and large-mouthed diverticula were                            found in the entire colon.                           The exam was otherwise without abnormality on                            direct and retroflexion views. Complications:            No immediate complications. Estimated blood loss:                            None. Estimated Blood Loss:     Estimated blood loss: none. Impression:               - Diverticulosis in the entire examined colon.                           - The examination was otherwise normal  on direct                            and retroflexion views.                           - No polyps or cancers. Recommendation:           - Patient has a contact number available for                            emergencies. The signs and symptoms of potential                            delayed complications were discussed with the                            patient. Return to normal activities tomorrow.                            Written discharge instructions were provided to the                            patient.                           - Resume previous diet.                           - Continue present medications.                           - You do not need any further colon cancer                            screening tests (including stool testing). These                             types of tests generally stop around age 7-80. Milus Banister, MD 11/04/2021 10:32:08 AM This report has been signed electronically.

## 2021-11-05 ENCOUNTER — Telehealth: Payer: Self-pay

## 2021-11-05 NOTE — Telephone Encounter (Signed)
  Follow up Call-     11/04/2021    9:24 AM  Call back number  Post procedure Call Back phone  # [phone]  Permission to leave phone message Yes     Patient questions:  Do you have a fever, pain , or abdominal swelling? No. Pain Score  0 *  Have you tolerated food without any problems? Yes.    Have you been able to return to your normal activities? Yes.    Do you have any questions about your discharge instructions: Diet   No. Medications  No. Follow up visit  No.  Do you have questions or concerns about your Care? No.  Actions: * If pain score is 4 or above: No action needed, pain <4.

## 2021-11-12 ENCOUNTER — Inpatient Hospital Stay: Payer: Medicare PPO | Admitting: Hematology

## 2021-11-12 ENCOUNTER — Inpatient Hospital Stay: Payer: Medicare PPO

## 2021-12-02 ENCOUNTER — Other Ambulatory Visit: Payer: Self-pay

## 2021-12-02 DIAGNOSIS — C50112 Malignant neoplasm of central portion of left female breast: Secondary | ICD-10-CM

## 2021-12-03 ENCOUNTER — Encounter: Payer: Self-pay | Admitting: Hematology

## 2021-12-03 ENCOUNTER — Inpatient Hospital Stay: Payer: Medicare PPO | Attending: Hematology

## 2021-12-03 ENCOUNTER — Inpatient Hospital Stay: Payer: Medicare PPO | Admitting: Hematology

## 2021-12-03 ENCOUNTER — Other Ambulatory Visit: Payer: Self-pay

## 2021-12-03 VITALS — BP 113/70 | HR 67 | Temp 98.6°F | Resp 18 | Ht 67.0 in | Wt 144.6 lb

## 2021-12-03 DIAGNOSIS — Z923 Personal history of irradiation: Secondary | ICD-10-CM | POA: Diagnosis not present

## 2021-12-03 DIAGNOSIS — M8580 Other specified disorders of bone density and structure, unspecified site: Secondary | ICD-10-CM | POA: Diagnosis not present

## 2021-12-03 DIAGNOSIS — Z8049 Family history of malignant neoplasm of other genital organs: Secondary | ICD-10-CM | POA: Insufficient documentation

## 2021-12-03 DIAGNOSIS — Z79810 Long term (current) use of selective estrogen receptor modulators (SERMs): Secondary | ICD-10-CM | POA: Diagnosis not present

## 2021-12-03 DIAGNOSIS — Z803 Family history of malignant neoplasm of breast: Secondary | ICD-10-CM | POA: Insufficient documentation

## 2021-12-03 DIAGNOSIS — C50112 Malignant neoplasm of central portion of left female breast: Secondary | ICD-10-CM

## 2021-12-03 DIAGNOSIS — M542 Cervicalgia: Secondary | ICD-10-CM | POA: Insufficient documentation

## 2021-12-03 DIAGNOSIS — D0512 Intraductal carcinoma in situ of left breast: Secondary | ICD-10-CM | POA: Diagnosis not present

## 2021-12-03 LAB — CMP (CANCER CENTER ONLY)
ALT: 17 U/L (ref 0–44)
AST: 28 U/L (ref 15–41)
Albumin: 4.3 g/dL (ref 3.5–5.0)
Alkaline Phosphatase: 48 U/L (ref 38–126)
Anion gap: 4 — ABNORMAL LOW (ref 5–15)
BUN: 17 mg/dL (ref 8–23)
CO2: 29 mmol/L (ref 22–32)
Calcium: 10 mg/dL (ref 8.9–10.3)
Chloride: 104 mmol/L (ref 98–111)
Creatinine: 0.73 mg/dL (ref 0.44–1.00)
GFR, Estimated: >60 mL/min (ref >60)
Glucose, Bld: 93 mg/dL (ref 70–99)
Potassium: 4.2 mmol/L (ref 3.5–5.1)
Sodium: 137 mmol/L (ref 135–145)
Total Bilirubin: 0.7 mg/dL (ref 0.3–1.2)
Total Protein: 6.5 g/dL (ref 6.5–8.1)

## 2021-12-03 LAB — CBC WITH DIFFERENTIAL (CANCER CENTER ONLY)
Abs Immature Granulocytes: 0.02 10*3/uL (ref 0.00–0.07)
Basophils Absolute: 0.1 10*3/uL (ref 0.0–0.1)
Basophils Relative: 1 %
Eosinophils Absolute: 0.2 10*3/uL (ref 0.0–0.5)
Eosinophils Relative: 3 %
HCT: 41.9 % (ref 36.0–46.0)
Hemoglobin: 14.5 g/dL (ref 12.0–15.0)
Immature Granulocytes: 0 %
Lymphocytes Relative: 20 %
Lymphs Abs: 1.5 10*3/uL (ref 0.7–4.0)
MCH: 30.9 pg (ref 26.0–34.0)
MCHC: 34.6 g/dL (ref 30.0–36.0)
MCV: 89.1 fL (ref 80.0–100.0)
Monocytes Absolute: 0.6 10*3/uL (ref 0.1–1.0)
Monocytes Relative: 8 %
Neutro Abs: 5.1 10*3/uL (ref 1.7–7.7)
Neutrophils Relative %: 68 %
Platelet Count: 181 10*3/uL (ref 150–400)
RBC: 4.7 MIL/uL (ref 3.87–5.11)
RDW: 12.5 % (ref 11.5–15.5)
WBC Count: 7.5 10*3/uL (ref 4.0–10.5)
nRBC: 0 % (ref 0.0–0.2)

## 2021-12-03 MED ORDER — TAMOXIFEN CITRATE 10 MG PO TABS: 10.0000 mg | ORAL_TABLET | Freq: Every day | ORAL | 3 refills | Status: DC

## 2021-12-03 NOTE — Progress Notes (Signed)
[facility]   [phone] [phone]   Clinic Follow up Note   Patient Care Team: Marco Collie, MD as PCP - General (Family Medicine) Karma Ganja, NP as Nurse Practitioner (Gynecology) Mauro Kaufmann, RN as Oncology Nurse Navigator Rockwell Germany, RN as Oncology Nurse Navigator Truitt Merle, MD as Consulting Physician (Hematology) Jovita Kussmaul, MD as Consulting Physician (General Surgery) Gery Pray, MD as Consulting Physician (Radiation Oncology) Richardo Priest, MD as Consulting Physician (Cardiology) Landis Martins, DPM as Consulting Physician (Podiatry) Sydnee Levans, MD as Referring Physician (Dermatology) Alla Feeling, NP as Nurse Practitioner (Nurse Practitioner)  Date of Service:  12/03/2021  CHIEF COMPLAINT: f/u of left breast DCIS  CURRENT THERAPY:  Tamoxifen, started 06/2021  ASSESSMENT & PLAN:  Kelli Saunders is a 71 y.o. post-menopausal female with   1. Left breast DCIS with microinvasion, grade 3, ER+/PR+ -found on screening mammogram. S/p left lumpectomy on 02/25/21 with Dr. Marlou Starks showed DCIS with a focus suspicious for microinvasive carcinoma, 1.5 cm. Margins and lymph nodes negative. -her postoperative course was complicated by seroma and illness. She recovered well  -s/p adjuvant radiation with Dr. Sondra Come 11/10-12/20/22. -she started tamoxifen 70m daily in mid 06/2021. She is moderately tolerating well with hot flashes and neck/shoulder arthralgia which may or may not be related to Tamoxifen. I discussed since she is low risk, I feels she could cut down to 10 mg, or hold if for a few months to see if her headaches and neck pain improves off medicine. She would like to continue and is interested in dropping the dose. I called in 160mdaily dose today. -she is clinically doing well aside from the new arthralgias. Labs reviewed, all WNL. Physical exam was unremarkable. There is no clinical concern for  recurrence. -she is scheduled for annual mammogram next week, however [facility]  2. Head, Neck and Shoulder Pains -started 07/2021 after starting tamoxifen a month -she is currently undergoing massage therapy with some improvement. -I offered referral to Dr. VaMickeal Skinneror further work up, rule out occipital neuralgia; she would like to proceed.   3. Genetics -she reports breast cancer in maternal aunt and cervical cancer in her mother.  -genetic testing on 05/28/21 was negative.   4. Osteopenia -DEXA in 08/13/19 showed osteopenia with lowest T-score -2.1. Repeat 08/14/21 showed slight improvement, T-score -1.9. -she is taking vit D and calcium supplements     Plan: -reduce tamoxifen to 10 mg, I called in today -mammogram next week -referral to Dr. VaMickeal Skinneror headaches and neck pain evaluation -lab and f/u with NP Lacie in 6 months   No problem-specific Assessment & Plan notes found for this encounter.   SUMMARY OF ONCOLOGIC HISTORY: Oncology History Overview Note  Cancer Staging Ductal carcinoma in situ (DCIS) of left breast Staging form: Breast, AJCC 8th Edition - Clinical: No stage assigned - Unsigned    Ductal carcinoma in situ (DCIS) of left breast  12/18/2020 Mammogram   Left Diagnostic  IMPRESSION: The new 2.2 cm cluster of linear fine pleomorphic calcifications in the left breast at 1 o'clock in the retroreolar region 1 cm from the nipple. No other significant masses, calcifications, or other findings are seen in either breast.   01/05/2021 Initial Diagnosis   Ductal carcinoma in situ (DCIS) of left breast   02/25/2021  Definitive Surgery   Left breast lumpectomy with radioactive seed localization and deep left axillary sentinel lymph node biopsy by Dr. Autumn Messing   02/25/2021 Pathology Results   FINAL MICROSCOPIC DIAGNOSIS:  A. BREAST,  LEFT, LUMPECTOMY:  - High-grade ductal carcinoma in situ, 1.5 cm, with a focus suspicious for microinvasive carcinoma.  See comment  - Resection margins are negative for carcinoma; anterior margin 2 mm from microinvasive carcinoma  - Biopsy site changes  - See oncology table  B. LYMPH NODE, LEFT #1, SENTINEL, EXCISION:  - Lymph node, negative for carcinoma (0/1)  C. LYMPH NODE, LEFT, SENTINEL, EXCISION:  - Lymph node, negative for carcinoma (0/1)  D. LYMPH NODE, LEFT, SENTINEL, EXCISION:  - Lymph node, negative for carcinoma (0/1)  E. LYMPH NODE, LEFT, SENTINEL, EXCISION:  - Lymph node, negative for carcinoma (0/1)  F. LYMPH NODE, LEFT, SENTINEL, EXCISION:  - Lymph node, negative for carcinoma (0/1)  G. LYMPH NODE, LEFT #2, SENTINEL, EXCISION:  - Lymph node, negative for carcinoma (0/1)  H. LYMPH NODE, LEFT #3, SENTINEL, EXCISION:  - Lymph node, negative for carcinoma (0/1)    04/13/2021 - 06/02/2021 Radiation Therapy   Per Dr. Sondra Come radiation treatment dates:  04/13/21 through 06/02/21 Intent: Curative Site/dose: Left breast: 40.05 Gy delivered in 15 fractions of 2.67 Gy/Fx Left breast boost: 12.00 Gy delivered in 6 fractions of 2.00 Gy/Fx   06/12/2021 Genetic Testing   Negative genetic testing on the CancerNext-Expanded+RNAinsignt panel.  The report date is June 12, 2021.  The CancerNext-Expanded gene panel offered by [facility] and includes sequencing and rearrangement analysis for the following 77 genes: AIP, ALK, APC*, ATM*, AXIN2, BAP1, BARD1, BLM, BMPR1A, BRCA1*, BRCA2*, BRIP1*, CDC73, CDH1*, CDK4, CDKN1B, CDKN2A, CHEK2*, CTNNA1, DICER1, FANCC, FH, FLCN, GALNT12, KIF1B, LZTR1, MAX, MEN1, MET, MLH1*, MSH2*, MSH3, MSH6*, MUTYH*, NBN, NF1*, NF2, NTHL1, PALB2*, PHOX2B, PMS2*, POT1, PRKAR1A, PTCH1, PTEN*, RAD51C*, RAD51D*, RB1, RECQL, RET, SDHA, SDHAF2, SDHB, SDHC, SDHD, SMAD4, SMARCA4, SMARCB1, SMARCE1, STK11, SUFU, TMEM127, TP53*, TSC1, TSC2, VHL and XRCC2 (sequencing and  deletion/duplication); EGFR, EGLN1, HOXB13, KIT, MITF, PDGFRA, POLD1, and POLE (sequencing only); EPCAM and GREM1 (deletion/duplication only). DNA and RNA analyses performed for * genes.    06/2021 -  Anti-estrogen oral therapy   Adjuvant tamoxifen 20 mg p.o. once daily   08/12/2021 Survivorship   SCP delivered by Cira Rue, NP      INTERVAL HISTORY:  [name] is here for a follow up of DCIS. She was last seen by me on 05/12/21. She presents to the clinic accompanied by a friend, Shirlean Mylar. She reports she is doing moderately well overall. She reports hot flashes and new pains from tamoxifen. She explains in 07/2021, she developed pain to her b/l shoulders, neck, and headaches. She adds she is undergoing massage therapy for this.   All other systems were reviewed with the patient and are negative.  MEDICAL HISTORY:  Past Medical History:  Diagnosis Date   Allergy 10/15/1958   Breast cancer ([place]) 12/30/2020   left   Family history of breast cancer    Family history of long QT syndrome 12/01/2017   Heart murmur 1981   History of radiation therapy    left breast - 04/23/2021-06/02/2021  Dr Gery Pray   Hx of transfusion of packed red blood cells 1981   after childbirth   Hyperlipidemia 12/01/2017   Left atrial enlargement 12/27/2014   Mitral valve prolapse 12/01/2017   MVP (mitral valve prolapse)    Osteopenia  Palpitation 12/26/2014   Pectus excavatum 12/01/2017   Thoracic scoliosis 12/01/2017   Wrist fracture, right 07/2013   slipped on ice    SURGICAL HISTORY: Past Surgical History:  Procedure Laterality Date   BREAST LUMPECTOMY WITH RADIOACTIVE SEED AND SENTINEL LYMPH NODE BIOPSY Left 02/25/2021   Procedure: LEFT BREAST LUMPECTOMY WITH RADIOACTIVE SEED AND SENTINEL LYMPH NODE BIOPSY;  Surgeon: Jovita Kussmaul, MD;  Location: [facility];  Service: General;  Laterality: Left;   COLONOSCOPY  2013   Dr.Butler-normal   DRAINAGE AND CLOSURE OF  LYMPHOCELE Left 03/19/2021   Procedure: Placement drain left axillary seroma;  Surgeon: Jovita Kussmaul, MD;  Location: [facility] OR;  Service: General;  Laterality: Left;   L5 discectomy  2007   SPINE SURGERY  2007   TUBAL LIGATION  1993    I have reviewed the social history and family history with the patient and they are unchanged from previous note.  ALLERGIES:  is allergic to sulfa antibiotics.  MEDICATIONS:  Current Outpatient Medications  Medication Sig Dispense Refill   tamoxifen (NOLVADEX) 10 MG tablet Take 1 tablet (10 mg total) by mouth daily. 30 tablet 3   acetaminophen (TYLENOL) 650 MG CR tablet Take 650-1,300 mg by mouth every 8 (eight) hours as needed for pain.     bimatoprost (LATISSE) 0.03 % ophthalmic solution Apply 1 application to eye daily. Uses on eye brows, not in eye     Calcium Carb-Cholecalciferol (CALCIUM 600 + D PO) Take 1 tablet by mouth daily.     Coenzyme Q10 (CO Q 10 PO) Take 1 tablet by mouth daily.     loratadine (CLARITIN) 10 MG tablet Take 10 mg by mouth daily as needed for allergies.     metoprolol succinate (TOPROL XL) 25 MG 24 hr tablet Take 0.5 tablets (12.5 mg total) by mouth 3 (three) times a week. (Patient taking differently: Take 12.5 mg by mouth every Monday, Wednesday, and Friday.) 20 tablet 3   Multiple Vitamin (MULTIVITAMIN) tablet Take 1 tablet by mouth daily.     Omega-3 Fatty Acids (FISH OIL PO) Take 1 tablet by mouth daily.     Probiotic Product (FORTIFY PROBIOTIC WOMENS EX ST PO) Take 1 tablet by mouth daily.     UNABLE TO FIND Place 1 capsule into both eyes daily. HydroEye Softgels Dry Eye Relief ( proprietary blend of GLA and omega 3 fatty acids (EPA and DHA)     VITAMIN D PO Take 5,000 Units by mouth daily.     No current facility-administered medications for this visit.    PHYSICAL EXAMINATION: ECOG PERFORMANCE STATUS: 1 - Symptomatic but completely ambulatory  Vitals:   12/03/21 1330  BP: 113/70  Pulse: 67  Resp: 18  Temp: 98.6  F (37 C)  SpO2: 100%   Wt Readings from Last 3 Encounters:  12/03/21 144 lb 9.6 oz (65.6 kg)  11/04/21 144 lb (65.3 kg)  10/15/21 144 lb (65.3 kg)     GENERAL:alert, no distress and comfortable SKIN: skin color, texture, turgor are normal, no rashes or significant lesions EYES: normal, Conjunctiva are pink and non-injected, sclera clear  NECK: supple, thyroid normal size, non-tender, without nodularity LYMPH:  no palpable lymphadenopathy in the cervical, axillary LUNGS: clear to auscultation and percussion with normal breathing effort HEART: regular rate & rhythm and no murmurs and no lower extremity edema ABDOMEN:abdomen soft, non-tender and normal bowel sounds Musculoskeletal:no cyanosis of digits and no clubbing  NEURO: alert & oriented x 3  with fluent speech, no focal motor/sensory deficits BREAST: No palpable mass, nodules or adenopathy bilaterally. Breast exam benign.   LABORATORY DATA:  I have reviewed the data as listed    Latest Ref Rng & Units 12/03/2021    1:13 PM 01/07/2021   12:37 PM 04/05/2017   10:30 AM  CBC  WBC 4.0 - 10.5 K/uL 7.5  6.4  5.9   Hemoglobin 12.0 - 15.0 g/dL 14.5  14.8  15.8   Hematocrit 36.0 - 46.0 % 41.9  43.6  46.9   Platelets 150 - 400 K/uL 181  216  211         Latest Ref Rng & Units 12/03/2021    1:13 PM 01/07/2021   12:37 PM 12/21/2018    9:56 AM  CMP  Glucose 70 - 99 mg/dL 93  91  97   BUN 8 - 23 mg/dL 17  13  14    Creatinine 0.44 - 1.00 mg/dL 0.73  0.84  0.77   Sodium 135 - 145 mmol/L 137  141  137   Potassium 3.5 - 5.1 mmol/L 4.2  4.0  4.4   Chloride 98 - 111 mmol/L 104  105  98   CO2 22 - 32 mmol/L 29  26  24    Calcium 8.9 - 10.3 mg/dL 10.0  9.8  9.7   Total Protein 6.5 - 8.1 g/dL 6.5  7.1  6.7   Total Bilirubin 0.3 - 1.2 mg/dL 0.7  1.1  1.1   Alkaline Phos 38 - 126 U/L 48  72  66   AST 15 - 41 U/L 28  29  29    ALT 0 - 44 U/L 17  19  21        RADIOGRAPHIC STUDIES: I have personally reviewed the radiological images as  listed and agreed with the findings in the report. No results found.    No orders of the defined types were placed in this encounter.  All questions were answered. The patient knows to call the clinic with any problems, questions or concerns. No barriers to learning was detected. The total time spent in the appointment was 30 minutes.     Truitt Merle, MD 12/03/2021   I, Wilburn Mylar, am acting as scribe for Truitt Merle, MD.   I have reviewed the above documentation for accuracy and completeness, and I agree with the above.

## 2021-12-07 ENCOUNTER — Telehealth: Payer: Self-pay | Admitting: Internal Medicine

## 2021-12-07 NOTE — Telephone Encounter (Signed)
Scheduled appointment per 06/26. Patient is aware of appointment date and time. Patient is aware to arrive 15 mins prior to appointment time and to bring updated insurance cards. Patient is aware of location.

## 2021-12-09 ENCOUNTER — Encounter: Payer: Self-pay | Admitting: Hematology

## 2021-12-09 DIAGNOSIS — Z853 Personal history of malignant neoplasm of breast: Secondary | ICD-10-CM | POA: Diagnosis not present

## 2021-12-14 ENCOUNTER — Other Ambulatory Visit: Payer: Self-pay

## 2021-12-14 ENCOUNTER — Inpatient Hospital Stay: Payer: Medicare PPO | Attending: Hematology | Admitting: Internal Medicine

## 2021-12-14 VITALS — BP 119/68 | HR 71 | Temp 98.1°F | Resp 17 | Ht 67.0 in | Wt 146.5 lb

## 2021-12-14 DIAGNOSIS — M542 Cervicalgia: Secondary | ICD-10-CM | POA: Insufficient documentation

## 2021-12-14 DIAGNOSIS — I341 Nonrheumatic mitral (valve) prolapse: Secondary | ICD-10-CM | POA: Diagnosis not present

## 2021-12-14 DIAGNOSIS — D0512 Intraductal carcinoma in situ of left breast: Secondary | ICD-10-CM | POA: Insufficient documentation

## 2021-12-14 DIAGNOSIS — Z170 Estrogen receptor positive status [ER+]: Secondary | ICD-10-CM | POA: Diagnosis not present

## 2021-12-14 DIAGNOSIS — M5481 Occipital neuralgia: Secondary | ICD-10-CM | POA: Insufficient documentation

## 2021-12-14 DIAGNOSIS — Z803 Family history of malignant neoplasm of breast: Secondary | ICD-10-CM | POA: Diagnosis not present

## 2021-12-14 DIAGNOSIS — E785 Hyperlipidemia, unspecified: Secondary | ICD-10-CM | POA: Insufficient documentation

## 2021-12-14 DIAGNOSIS — Z923 Personal history of irradiation: Secondary | ICD-10-CM | POA: Insufficient documentation

## 2021-12-14 DIAGNOSIS — R011 Cardiac murmur, unspecified: Secondary | ICD-10-CM | POA: Diagnosis not present

## 2021-12-14 DIAGNOSIS — M8580 Other specified disorders of bone density and structure, unspecified site: Secondary | ICD-10-CM | POA: Insufficient documentation

## 2021-12-14 DIAGNOSIS — Z79810 Long term (current) use of selective estrogen receptor modulators (SERMs): Secondary | ICD-10-CM | POA: Insufficient documentation

## 2021-12-14 DIAGNOSIS — Z79899 Other long term (current) drug therapy: Secondary | ICD-10-CM | POA: Insufficient documentation

## 2021-12-14 NOTE — Progress Notes (Signed)
[facility] [place], [place] [address] [phone]   New Patient Evaluation  Date of Service: 12/14/21 Patient Name: Kelli Saunders Patient MRN: [id number] Patient DOB: 1950-09-24 Provider: Ventura Sellers, MD  Identifying Statement:  Kelli Saunders is a 71 y.o. female with Bilateral occipital neuralgia - Plan: Ambulatory referral to Neurosurgery who presents for initial consultation and evaluation regarding cancer associated neurologic deficits.    Referring Provider: Marco Collie, MD [place],  [place] [id number]  Primary Cancer:  Oncologic History: Oncology History Overview Note  Cancer Staging Ductal carcinoma in situ (DCIS) of left breast Staging form: Breast, AJCC 8th Edition - Clinical: No stage assigned - Unsigned    Ductal carcinoma in situ (DCIS) of left breast  12/18/2020 Mammogram   Left Diagnostic  IMPRESSION: The new 2.2 cm cluster of linear fine pleomorphic calcifications in the left breast at 1 o'clock in the retroreolar region 1 cm from the nipple. No other significant masses, calcifications, or other findings are seen in either breast.   01/05/2021 Initial Diagnosis   Ductal carcinoma in situ (DCIS) of left breast   02/25/2021 Definitive Surgery   Left breast lumpectomy with radioactive seed localization and deep left axillary sentinel lymph node biopsy by Dr. Autumn Messing   02/25/2021 Pathology Results   FINAL MICROSCOPIC DIAGNOSIS:  A. BREAST, LEFT, LUMPECTOMY:  - High-grade ductal carcinoma in situ, 1.5 cm, with a focus suspicious for microinvasive carcinoma.  See comment  - Resection margins are negative for carcinoma; anterior margin 2 mm from microinvasive carcinoma  - Biopsy site changes  - See oncology table  B. LYMPH NODE, LEFT #1, SENTINEL, EXCISION:  - Lymph node, negative for carcinoma (0/1)  C. LYMPH NODE, LEFT, SENTINEL, EXCISION:  - Lymph node, negative for carcinoma (0/1)  D. LYMPH  NODE, LEFT, SENTINEL, EXCISION:  - Lymph node, negative for carcinoma (0/1)  E. LYMPH NODE, LEFT, SENTINEL, EXCISION:  - Lymph node, negative for carcinoma (0/1)  F. LYMPH NODE, LEFT, SENTINEL, EXCISION:  - Lymph node, negative for carcinoma (0/1)  G. LYMPH NODE, LEFT #2, SENTINEL, EXCISION:  - Lymph node, negative for carcinoma (0/1)  H. LYMPH NODE, LEFT #3, SENTINEL, EXCISION:  - Lymph node, negative for carcinoma (0/1)    04/13/2021 - 06/02/2021 Radiation Therapy   Per Dr. Sondra Come radiation treatment dates:  04/13/21 through 06/02/21 Intent: Curative Site/dose: Left breast: 40.05 Gy delivered in 15 fractions of 2.67 Gy/Fx Left breast boost: 12.00 Gy delivered in 6 fractions of 2.00 Gy/Fx   06/12/2021 Genetic Testing   Negative genetic testing on the CancerNext-Expanded+RNAinsignt panel.  The report date is June 12, 2021.  The CancerNext-Expanded gene panel offered by [facility] and includes sequencing and rearrangement analysis for the following 77 genes: AIP, ALK, APC*, ATM*, AXIN2, BAP1, BARD1, BLM, BMPR1A, BRCA1*, BRCA2*, BRIP1*, CDC73, CDH1*, CDK4, CDKN1B, CDKN2A, CHEK2*, CTNNA1, DICER1, FANCC, FH, FLCN, GALNT12, KIF1B, LZTR1, MAX, MEN1, MET, MLH1*, MSH2*, MSH3, MSH6*, MUTYH*, NBN, NF1*, NF2, NTHL1, PALB2*, PHOX2B, PMS2*, POT1, PRKAR1A, PTCH1, PTEN*, RAD51C*, RAD51D*, RB1, RECQL, RET, SDHA, SDHAF2, SDHB, SDHC, SDHD, SMAD4, SMARCA4, SMARCB1, SMARCE1, STK11, SUFU, TMEM127, TP53*, TSC1, TSC2, VHL and XRCC2 (sequencing and deletion/duplication); EGFR, EGLN1, HOXB13, KIT, MITF, PDGFRA, POLD1, and POLE (sequencing only); EPCAM and GREM1 (deletion/duplication only). DNA and RNA analyses performed for * genes.    06/2021 -  Anti-estrogen oral therapy   Adjuvant tamoxifen 20 mg p.o. once daily   08/12/2021 Survivorship   SCP  delivered by Cira Rue, NP     History of Present Illness: The patient's records from the referring physician were obtained and reviewed and the patient  interviewed to confirm this HPI.  Kelli Saunders presents today to review head/neck pain syndrome.  She describes sharp, shooting pain up the back of her head, radiating from her neck.  Onset was 4-5 months ago.  She can reproduce the pain by turning her head in either direction, though more to the right.  Pain is also reproduced when laying back of head on a pillow.  She has been dosing aleve nightly, which has helped somewhat.  PT and massage have not improved the pain.    Medications: Current Outpatient Medications on File Prior to Visit  Medication Sig Dispense Refill   acetaminophen (TYLENOL) 650 MG CR tablet Take 650-1,300 mg by mouth every 8 (eight) hours as needed for pain.     bimatoprost (LATISSE) 0.03 % ophthalmic solution Apply 1 application to eye daily. Uses on eye brows, not in eye     Calcium Carb-Cholecalciferol (CALCIUM 600 + D PO) Take 1 tablet by mouth daily.     Coenzyme Q10 (CO Q 10 PO) Take 1 tablet by mouth daily.     loratadine (CLARITIN) 10 MG tablet Take 10 mg by mouth daily as needed for allergies.     metoprolol succinate (TOPROL XL) 25 MG 24 hr tablet Take 0.5 tablets (12.5 mg total) by mouth 3 (three) times a week. (Patient taking differently: Take 12.5 mg by mouth every Monday, Wednesday, and Friday.) 20 tablet 3   Multiple Vitamin (MULTIVITAMIN) tablet Take 1 tablet by mouth daily.     Omega-3 Fatty Acids (FISH OIL PO) Take 1 tablet by mouth daily.     Probiotic Product (FORTIFY PROBIOTIC WOMENS EX ST PO) Take 1 tablet by mouth daily.     tamoxifen (NOLVADEX) 10 MG tablet Take 1 tablet (10 mg total) by mouth daily. 30 tablet 3   UNABLE TO FIND Place 1 capsule into both eyes daily. HydroEye Softgels Dry Eye Relief ( proprietary blend of GLA and omega 3 fatty acids (EPA and DHA)     VITAMIN D PO Take 5,000 Units by mouth daily.     No current facility-administered medications on file prior to visit.    Allergies:  Allergies  Allergen Reactions   Sulfa  Antibiotics Itching   Past Medical History:  Past Medical History:  Diagnosis Date   Allergy 10/15/1958   Breast cancer ([place]) 12/30/2020   left   Family history of breast cancer    Family history of long QT syndrome 12/01/2017   Heart murmur 1981   History of radiation therapy    left breast - 04/23/2021-06/02/2021  Dr Gery Pray   Hx of transfusion of packed red blood cells 1981   after childbirth   Hyperlipidemia 12/01/2017   Left atrial enlargement 12/27/2014   Mitral valve prolapse 12/01/2017   MVP (mitral valve prolapse)    Osteopenia    Palpitation 12/26/2014   Pectus excavatum 12/01/2017   Thoracic scoliosis 12/01/2017   Wrist fracture, right 07/2013   slipped on ice   Past Surgical History:  Past Surgical History:  Procedure Laterality Date   BREAST LUMPECTOMY WITH RADIOACTIVE SEED AND SENTINEL LYMPH NODE BIOPSY Left 02/25/2021   Procedure: LEFT BREAST LUMPECTOMY WITH RADIOACTIVE SEED AND SENTINEL LYMPH NODE BIOPSY;  Surgeon: Jovita Kussmaul, MD;  Location: [facility];  Service: General;  Laterality: Left;  COLONOSCOPY  2013   Dr.Butler-normal   DRAINAGE AND CLOSURE OF LYMPHOCELE Left 03/19/2021   Procedure: Placement drain left axillary seroma;  Surgeon: Jovita Kussmaul, MD;  Location: [facility] OR;  Service: General;  Laterality: Left;   L5 discectomy  2007   SPINE SURGERY  2007   TUBAL LIGATION  1993   Social History:  Social History   Socioeconomic History   Marital status: Married    Spouse name: Not on file   Number of children: 2   Years of education: Not on file   Highest education level: Not on file  Occupational History   Not on file  Tobacco Use   Smoking status: Never   Smokeless tobacco: Never  Vaping Use   Vaping Use: Never used  Substance and Sexual Activity   Alcohol use: Yes    Alcohol/week: 3.0 standard drinks of alcohol    Types: 3 Glasses of wine per week   Drug use: No   Sexual activity: Yes    Partners: Male    Birth  control/protection: Surgical    Comment: BTL-1st intercourse 71 yo-Fewer than 5 partners  Other Topics Concern   Not on file  Social History Narrative   Not on file   Social Determinants of Health   Financial Resource Strain: Not on file  Food Insecurity: Not on file  Transportation Needs: Not on file  Physical Activity: Not on file  Stress: Not on file  Social Connections: Not on file  Intimate Partner Violence: Not on file   Family History:  Family History  Problem Relation Age of Onset   Hypertension Mother    Mitral valve prolapse Mother    Arthritis Mother    Varicose Veins Mother    Hypertension Father    Alzheimer's disease Father    Breast cancer Maternal Aunt 48   Dementia Paternal Aunt    Pneumonia Paternal Aunt    Diabetes Maternal Grandmother    Kidney failure Maternal Grandfather    Diabetes Paternal Grandfather    Congestive Heart Failure Paternal Grandfather    Colon cancer Neg Hx    Esophageal cancer Neg Hx    Rectal cancer Neg Hx    Stomach cancer Neg Hx     Review of Systems: Constitutional: Doesn't report fevers, chills or abnormal weight loss Eyes: Doesn't report blurriness of vision Ears, nose, mouth, throat, and face: Doesn't report sore throat Respiratory: Doesn't report cough, dyspnea or wheezes Cardiovascular: Doesn't report palpitation, chest discomfort  Gastrointestinal:  Doesn't report nausea, constipation, diarrhea GU: Doesn't report incontinence Skin: Doesn't report skin rashes Neurological: Per HPI Musculoskeletal: Doesn't report joint pain Behavioral/Psych: Doesn't report anxiety  Physical Exam: Vitals:   12/14/21 1401  BP: 119/68  Pulse: 71  Resp: 17  Temp: 98.1 F (36.7 C)  SpO2: 99%   KPS: 90. General: Alert, cooperative, pleasant, in no acute distress Head: Normal EENT: No conjunctival injection or scleral icterus.  Lungs: Resp effort normal Cardiac: Regular rate Abdomen: Non-distended abdomen Skin: No rashes  cyanosis or petechiae. Extremities: No clubbing or edema  Neurologic Exam: Mental Status: Awake, alert, attentive to examiner. Oriented to self and environment. Language is fluent with intact comprehension.  Cranial Nerves: Visual acuity is grossly normal. Visual fields are full. Extra-ocular movements intact. No ptosis. Face is symmetric Motor: Tone and bulk are normal. Power is full in both arms and legs. Reflexes are symmetric, no pathologic reflexes present.  Sensory: Intact to light touch Gait: Normal.   Labs:  I have reviewed the data as listed    Component Value Date/Time   NA 137 12/03/2021 1313   NA 137 12/21/2018 0956   K 4.2 12/03/2021 1313   CL 104 12/03/2021 1313   CO2 29 12/03/2021 1313   GLUCOSE 93 12/03/2021 1313   BUN 17 12/03/2021 1313   BUN 14 12/21/2018 0956   CREATININE 0.73 12/03/2021 1313   CREATININE 0.76 03/03/2016 1025   CALCIUM 10.0 12/03/2021 1313   PROT 6.5 12/03/2021 1313   PROT 6.7 12/21/2018 0956   ALBUMIN 4.3 12/03/2021 1313   ALBUMIN 4.9 (H) 12/21/2018 0956   AST 28 12/03/2021 1313   ALT 17 12/03/2021 1313   ALKPHOS 48 12/03/2021 1313   BILITOT 0.7 12/03/2021 1313   GFRNONAA >60 12/03/2021 1313   GFRAA 92 12/21/2018 0956   Lab Results  Component Value Date   WBC 7.5 12/03/2021   NEUTROABS 5.1 12/03/2021   HGB 14.5 12/03/2021   HCT 41.9 12/03/2021   MCV 89.1 12/03/2021   PLT 181 12/03/2021    Assessment/Plan Bilateral occipital neuralgia - Plan: Ambulatory referral to [facility] presents with clinical syndrome consistent with bilateral occipital neuralgia.  Etiology may be compressive or arthritic changes, do not suspect malignancy at this time.  We recommended referral to Dr. Davy Pique for greater occipital nerve block.  She is agreeable with this plan.   If symptoms recur or worsen, we could consider imgaging the neck with MRI.  We spent twenty additional minutes teaching regarding the natural history,  biology, and historical experience in the treatment of neurologic complications of cancer.   We appreciate the opportunity to participate in the care of [place].  She can return to clinic as needed, as discussed.  All questions were answered. The patient knows to call the clinic with any problems, questions or concerns. No barriers to learning were detected.  The total time spent in the encounter was 40 minutes and more than 50% was on counseling and review of test results   Ventura Sellers, MD Medical Director of Neuro-Oncology [facility] at [place] 12/14/21 3:47 PM

## 2021-12-17 ENCOUNTER — Telehealth: Payer: Self-pay | Admitting: *Deleted

## 2021-12-17 NOTE — Telephone Encounter (Signed)
Faxed referral to Dr Davy Pique for Nerve Block.

## 2021-12-29 DIAGNOSIS — Z85828 Personal history of other malignant neoplasm of skin: Secondary | ICD-10-CM | POA: Diagnosis not present

## 2021-12-29 DIAGNOSIS — Z853 Personal history of malignant neoplasm of breast: Secondary | ICD-10-CM | POA: Diagnosis not present

## 2021-12-29 DIAGNOSIS — Z87440 Personal history of urinary (tract) infections: Secondary | ICD-10-CM | POA: Diagnosis not present

## 2021-12-29 DIAGNOSIS — C50919 Malignant neoplasm of unspecified site of unspecified female breast: Secondary | ICD-10-CM | POA: Diagnosis not present

## 2021-12-29 DIAGNOSIS — I471 Supraventricular tachycardia: Secondary | ICD-10-CM | POA: Diagnosis not present

## 2021-12-29 DIAGNOSIS — Z8249 Family history of ischemic heart disease and other diseases of the circulatory system: Secondary | ICD-10-CM | POA: Diagnosis not present

## 2021-12-29 DIAGNOSIS — Z79810 Long term (current) use of selective estrogen receptor modulators (SERMs): Secondary | ICD-10-CM | POA: Diagnosis not present

## 2021-12-29 DIAGNOSIS — K5900 Constipation, unspecified: Secondary | ICD-10-CM | POA: Diagnosis not present

## 2021-12-29 DIAGNOSIS — H04129 Dry eye syndrome of unspecified lacrimal gland: Secondary | ICD-10-CM | POA: Diagnosis not present

## 2022-01-05 DIAGNOSIS — M5481 Occipital neuralgia: Secondary | ICD-10-CM | POA: Diagnosis not present

## 2022-01-20 DIAGNOSIS — U071 COVID-19: Secondary | ICD-10-CM | POA: Diagnosis not present

## 2022-01-20 DIAGNOSIS — Z1331 Encounter for screening for depression: Secondary | ICD-10-CM | POA: Diagnosis not present

## 2022-01-20 DIAGNOSIS — Z6821 Body mass index (BMI) 21.0-21.9, adult: Secondary | ICD-10-CM | POA: Diagnosis not present

## 2022-03-01 DIAGNOSIS — E785 Hyperlipidemia, unspecified: Secondary | ICD-10-CM | POA: Diagnosis not present

## 2022-03-02 LAB — LIPID PANEL
Chol/HDL Ratio: 2.5 ratio (ref 0.0–4.4)
Cholesterol, Total: 170 mg/dL (ref 100–199)
HDL: 69 mg/dL (ref >39)
LDL Chol Calc (NIH): 87 mg/dL (ref 0–99)
Triglycerides: 73 mg/dL (ref 0–149)
VLDL Cholesterol Cal: 14 mg/dL (ref 5–40)

## 2022-03-10 DIAGNOSIS — R011 Cardiac murmur, unspecified: Secondary | ICD-10-CM | POA: Insufficient documentation

## 2022-03-10 DIAGNOSIS — T7840XA Allergy, unspecified, initial encounter: Secondary | ICD-10-CM | POA: Insufficient documentation

## 2022-03-10 DIAGNOSIS — Z923 Personal history of irradiation: Secondary | ICD-10-CM | POA: Insufficient documentation

## 2022-03-11 ENCOUNTER — Other Ambulatory Visit: Payer: Self-pay | Admitting: Cardiology

## 2022-03-12 ENCOUNTER — Ambulatory Visit: Payer: Medicare PPO | Attending: Cardiology | Admitting: Cardiology

## 2022-03-12 ENCOUNTER — Encounter: Payer: Self-pay | Admitting: Cardiology

## 2022-03-12 VITALS — BP 106/72 | HR 65 | Ht 68.0 in | Wt 141.2 lb

## 2022-03-12 DIAGNOSIS — I471 Supraventricular tachycardia: Secondary | ICD-10-CM | POA: Diagnosis not present

## 2022-03-12 DIAGNOSIS — I4719 Other supraventricular tachycardia: Secondary | ICD-10-CM

## 2022-03-12 DIAGNOSIS — R55 Syncope and collapse: Secondary | ICD-10-CM

## 2022-03-12 DIAGNOSIS — I341 Nonrheumatic mitral (valve) prolapse: Secondary | ICD-10-CM

## 2022-03-12 MED ORDER — METOPROLOL SUCCINATE ER 25 MG PO TB24: 12.5000 mg | ORAL_TABLET | ORAL | 3 refills | Status: DC

## 2022-03-12 NOTE — Progress Notes (Signed)
Cardiology Office Note:    Date:  03/12/2022   ID:  Kelli Saunders, DOB 02/13/1951, MRN [id number]  PCP:  Marco Collie, MD  Cardiologist:  Shirlee More, MD    Referring MD: Marco Collie, MD    ASSESSMENT:    1. Mitral valve prolapse   2. Syncope and collapse   3. PAT (paroxysmal atrial tachycardia)    PLAN:    In order of problems listed above:  Cathy has done well with prolapse not having symptoms she has had screening echocardiograms done without significant valvular dysfunction and her atrial arrhythmia has been well controlled with her current beta-blocker. Reviewed her lipid profile which is favorable and fortunately she has a coronary calcium score of 0 done in 2021   Next appointment: She will continue her beta-blocker and follow-up 1 year   Medication Adjustments/Labs and Tests Ordered: Current medicines are reviewed at length with the patient today.  Concerns regarding medicines are outlined above.  Orders Placed This Encounter  Procedures   EKG 12-Lead   Meds ordered this encounter  Medications   metoprolol succinate (TOPROL XL) 25 MG 24 hr tablet    Sig: Take 0.5 tablets (12.5 mg total) by mouth 3 (three) times a week.    Dispense:  20 tablet    Refill:  3    Chief Complaint  Patient presents with   Follow-up   Mitral Valve Prolapse   Palpitations    History of Present Illness:    Kelli Saunders is a 71 y.o. female with a hx of mitral valve prolapse palpitation family history of long QT syndrome  last seen 01/12/2021 following a syncopal episode.  She had a coronary calcium score performed 01/23/2019 was 0 and normal. Compliance with diet, lifestyle and medications: Yes  She has had a good year unfortunately she had COVID this summer.  She is recovered For history of breast radiation and has no evidence of recurrence Or atrial arrhythmias very well controlled with her beta-blocker wears a smart watch and has had no alerts for rapid heart  rhythm and no breakthrough clinical episodes She has mitral valve prolapse and has no chest pain shortness of breath palpitation or syncope Past Medical History:  Diagnosis Date   Allergy 10/15/1958   Breast cancer ([place]) 12/30/2020   left   Family history of breast cancer    Family history of long QT syndrome 12/01/2017   Heart murmur 1981   History of radiation therapy    left breast - 04/23/2021-06/02/2021  Dr Gery Pray   Hx of transfusion of packed red blood cells 1981   after childbirth   Hyperlipidemia 12/01/2017   Left atrial enlargement 12/27/2014   Mitral valve prolapse 12/01/2017   MVP (mitral valve prolapse)    Osteopenia    Palpitation 12/26/2014   Pectus excavatum 12/01/2017   Thoracic scoliosis 12/01/2017   Wrist fracture, right 07/2013   slipped on ice    Past Surgical History:  Procedure Laterality Date   BREAST LUMPECTOMY WITH RADIOACTIVE SEED AND SENTINEL LYMPH NODE BIOPSY Left 02/25/2021   Procedure: LEFT BREAST LUMPECTOMY WITH RADIOACTIVE SEED AND SENTINEL LYMPH NODE BIOPSY;  Surgeon: Jovita Kussmaul, MD;  Location: [facility];  Service: General;  Laterality: Left;   COLONOSCOPY  2013   Dr.Butler-normal   DRAINAGE AND CLOSURE OF LYMPHOCELE Left 03/19/2021   Procedure: Placement drain left axillary seroma;  Surgeon: Jovita Kussmaul, MD;  Location: [facility];  Service: General;  Laterality: Left;  L5 discectomy  2007   SPINE SURGERY  2007   TUBAL LIGATION  1993    Current Medications: Current Meds  Medication Sig   acetaminophen (TYLENOL) 650 MG CR tablet Take 650-1,300 mg by mouth every 8 (eight) hours as needed for pain.   bimatoprost (LATISSE) 0.03 % ophthalmic solution Apply 1 application to eye daily. Uses on eye brows, not in eye   Calcium Carb-Cholecalciferol (CALCIUM 600 + D PO) Take 1 tablet by mouth daily.   Coenzyme Q10 (CO Q 10 PO) Take 1 tablet by mouth daily.   loratadine (CLARITIN) 10 MG tablet Take 10 mg by mouth daily as needed  for allergies.   Multiple Vitamin (MULTIVITAMIN) tablet Take 1 tablet by mouth daily.   Omega-3 Fatty Acids (FISH OIL PO) Take 1 tablet by mouth daily.   Probiotic Product (FORTIFY PROBIOTIC WOMENS EX ST PO) Take 1 tablet by mouth daily.   tamoxifen (NOLVADEX) 10 MG tablet Take 1 tablet (10 mg total) by mouth daily.   VITAMIN D PO Take 5,000 Units by mouth daily.   [DISCONTINUED] metoprolol succinate (TOPROL XL) 25 MG 24 hr tablet Take 0.5 tablets (12.5 mg total) by mouth 3 (three) times a week. (Patient taking differently: Take 12.5 mg by mouth every Monday, Wednesday, and Friday.)     Allergies:   Sulfa antibiotics   Social History   Socioeconomic History   Marital status: Married    Spouse name: Not on file   Number of children: 2   Years of education: Not on file   Highest education level: Not on file  Occupational History   Not on file  Tobacco Use   Smoking status: Never   Smokeless tobacco: Never  Vaping Use   Vaping Use: Never used  Substance and Sexual Activity   Alcohol use: Yes    Alcohol/week: 3.0 standard drinks of alcohol    Types: 3 Glasses of wine per week   Drug use: No   Sexual activity: Yes    Partners: Male    Birth control/protection: Surgical    Comment: BTL-1st intercourse 71 yo-Fewer than 5 partners  Other Topics Concern   Not on file  Social History Narrative   Not on file   Social Determinants of Health   Financial Resource Strain: Not on file  Food Insecurity: Not on file  Transportation Needs: Not on file  Physical Activity: Not on file  Stress: Not on file  Social Connections: Not on file     Family History: The patient's family history includes Alzheimer's disease in her father; Arthritis in her mother; Breast cancer (age of onset: 29) in her maternal aunt; Congestive Heart Failure in her paternal grandfather; Dementia in her paternal aunt; Diabetes in her maternal grandmother and paternal grandfather; Hypertension in her father and  mother; Kidney failure in her maternal grandfather; Mitral valve prolapse in her mother; Pneumonia in her paternal aunt; Varicose Veins in her mother. There is no history of Colon cancer, Esophageal cancer, Rectal cancer, or Stomach cancer. ROS:   Please see the history of present illness.    All other systems reviewed and are negative.  EKGs/Labs/Other Studies Reviewed:    The following studies were reviewed today:  EKG:  EKG ordered today and personally reviewed.  The ekg ordered today demonstrates sinus rhythm right axis deviation is otherwise normal EKG  Recent Labs: 12/03/2021: ALT 17; BUN 17; Creatinine 0.73; Hemoglobin 14.5; Platelet Count 181; Potassium 4.2; Sodium 137  Recent Lipid Panel  Component Value Date/Time   CHOL 170 03/01/2022 0810   TRIG 73 03/01/2022 0810   HDL 69 03/01/2022 0810   CHOLHDL 2.5 03/01/2022 0810   CHOLHDL 2.4 03/03/2016 1025   VLDL 11 03/03/2016 1025   LDLCALC 87 03/01/2022 0810    Physical Exam:    VS:  BP 106/72 (BP Location: Right Arm, Patient Position: Sitting)   Pulse 65   Ht '5\' 8"'$  (1.727 m)   Wt 141 lb 3.2 oz (64 kg)   LMP 06/15/2003   SpO2 99%   BMI 21.47 kg/m     Wt Readings from Last 3 Encounters:  03/12/22 141 lb 3.2 oz (64 kg)  12/14/21 146 lb 8 oz (66.5 kg)  12/03/21 144 lb 9.6 oz (65.6 kg)     GEN:  Well nourished, well developed in no acute distress HEENT: Normal NECK: No JVD; No carotid bruits LYMPHATICS: No lymphadenopathy CARDIAC: She has early systolic click no murmur RRR, no murmurs, rubs, gallops RESPIRATORY:  Clear to auscultation without rales, wheezing or rhonchi  ABDOMEN: Soft, non-tender, non-distended MUSCULOSKELETAL:  No edema; No deformity  SKIN: Warm and dry NEUROLOGIC:  Alert and oriented x 3 PSYCHIATRIC:  Normal affect    Signed, Shirlee More, MD  03/12/2022 9:12 AM    [place]

## 2022-03-12 NOTE — Patient Instructions (Signed)
Medication Instructions:  Your physician recommends that you continue on your current medications as directed. Please refer to the Current Medication list given to you today.  *If you need a refill on your cardiac medications before your next appointment, please call your pharmacy*   Lab Work: None If you have labs (blood work) drawn today and your tests are completely normal, you will receive your results only by: MyChart Message (if you have MyChart) OR A paper copy in the mail If you have any lab test that is abnormal or we need to change your treatment, we will call you to review the results.   Testing/Procedures: None   Follow-Up: At [facility], you and your health needs are our priority.  As part of our continuing mission to provide you with exceptional heart care, we have created designated Provider Care Teams.  These Care Teams include your primary Cardiologist (physician) and Advanced Practice Providers (APPs -  Physician Assistants and Nurse Practitioners) who all work together to provide you with the care you need, when you need it.  We recommend signing up for the patient portal called "MyChart".  Sign up information is provided on this After Visit Summary.  MyChart is used to connect with patients for Virtual Visits (Telemedicine).  Patients are able to view lab/test results, encounter notes, upcoming appointments, etc.  Non-urgent messages can be sent to your provider as well.   To learn more about what you can do with MyChart, go to [email or web address].    Your next appointment:   1 year(s)  The format for your next appointment:   In Person  Provider:   Brian Munley, MD    Other Instructions None  Important Information About Sugar       

## 2022-03-18 DIAGNOSIS — D0512 Intraductal carcinoma in situ of left breast: Secondary | ICD-10-CM | POA: Diagnosis not present

## 2022-03-22 ENCOUNTER — Ambulatory Visit: Payer: Medicare PPO | Admitting: Podiatry

## 2022-03-22 ENCOUNTER — Ambulatory Visit (INDEPENDENT_AMBULATORY_CARE_PROVIDER_SITE_OTHER): Payer: Medicare PPO

## 2022-03-22 ENCOUNTER — Encounter: Payer: Self-pay | Admitting: Podiatry

## 2022-03-22 DIAGNOSIS — M79671 Pain in right foot: Secondary | ICD-10-CM

## 2022-03-22 DIAGNOSIS — M7671 Peroneal tendinitis, right leg: Secondary | ICD-10-CM

## 2022-03-22 DIAGNOSIS — M7661 Achilles tendinitis, right leg: Secondary | ICD-10-CM

## 2022-03-22 MED ORDER — TRIAMCINOLONE ACETONIDE 10 MG/ML IJ SUSP
10.0000 mg | Freq: Once | INTRAMUSCULAR | Status: AC
  Administered 2022-03-22: 10 mg

## 2022-03-22 MED ORDER — METHYLPREDNISOLONE 4 MG PO TBPK: ORAL_TABLET | ORAL | 0 refills | Status: DC

## 2022-03-22 NOTE — Progress Notes (Signed)
Subjective:   Patient ID: Kelli Saunders, female   DOB: 71 y.o.   MRN: [id number]   HPI Patient states she is getting ready to go to [place] and she has a lot of pain in the outside of her right foot and the outside of the Achilles tendon and is not sure of what she may have done.  States its been present for around 2 weeks   ROS      Objective:  Physical Exam  Neurovascular status intact with exquisite discomfort posterior aspect right heel lateral and around the Achilles tendon lateral side no central medial involvement.  Mild swelling around the area no other pathology noted no other joints that are inflamed     Assessment:  Probability for a localized process versus a systemic inflammatory condition with Achilles tendinitis peroneal tendinitis possible     Plan:  Reviewed condition and x-ray and at this point I did a careful steroid injection after explaining chances for risk around the peroneal and lateral side of the posterior Achilles tendon and I prescribed a 6-day Medrol Dosepak to take.  Patient will be seen back to recheck  X-rays were negative for signs of bony impingement small spur no others issues noted

## 2022-03-24 ENCOUNTER — Other Ambulatory Visit: Payer: Self-pay | Admitting: Podiatry

## 2022-03-24 DIAGNOSIS — M7671 Peroneal tendinitis, right leg: Secondary | ICD-10-CM

## 2022-03-25 ENCOUNTER — Ambulatory Visit: Payer: Medicare PPO | Admitting: Podiatry

## 2022-04-05 ENCOUNTER — Ambulatory Visit: Payer: Medicare PPO | Admitting: Podiatry

## 2022-04-05 ENCOUNTER — Encounter: Payer: Self-pay | Admitting: Podiatry

## 2022-04-05 DIAGNOSIS — M7671 Peroneal tendinitis, right leg: Secondary | ICD-10-CM | POA: Diagnosis not present

## 2022-04-05 DIAGNOSIS — M7661 Achilles tendinitis, right leg: Secondary | ICD-10-CM | POA: Diagnosis not present

## 2022-04-05 NOTE — Progress Notes (Signed)
Subjective:   Patient ID: Kelli Saunders, female   DOB: 71 y.o.   MRN: [id number]   HPI Patient states pain is gradually improving but still having a lot of stiffness and pain in the right ankle and would like to do physical therapy    ROS      Objective:  Physical Exam  Neurovascular status intact with stiffness of the right ankle as she is gotten used to compensating for peroneal tendinitis.  It is inflamed no other pathology noted     Assessment:  Peroneal tendinitis right improved still present with moderate pain noted     Plan:  Reviewed condition recommended home physical therapy that the patient can do and I am referring to [facility] for physical therapy to try to help mobilize the ankle joint and reduce the process occurring

## 2022-04-07 DIAGNOSIS — Z6821 Body mass index (BMI) 21.0-21.9, adult: Secondary | ICD-10-CM | POA: Diagnosis not present

## 2022-04-07 DIAGNOSIS — Z1339 Encounter for screening examination for other mental health and behavioral disorders: Secondary | ICD-10-CM | POA: Diagnosis not present

## 2022-04-07 DIAGNOSIS — Z139 Encounter for screening, unspecified: Secondary | ICD-10-CM | POA: Diagnosis not present

## 2022-04-07 DIAGNOSIS — Z0000 Encounter for general adult medical examination without abnormal findings: Secondary | ICD-10-CM | POA: Diagnosis not present

## 2022-04-07 DIAGNOSIS — Z1331 Encounter for screening for depression: Secondary | ICD-10-CM | POA: Diagnosis not present

## 2022-04-07 DIAGNOSIS — Z136 Encounter for screening for cardiovascular disorders: Secondary | ICD-10-CM | POA: Diagnosis not present

## 2022-04-11 ENCOUNTER — Other Ambulatory Visit: Payer: Self-pay | Admitting: Hematology

## 2022-04-12 ENCOUNTER — Other Ambulatory Visit: Payer: Self-pay

## 2022-04-13 ENCOUNTER — Telehealth: Payer: Self-pay | Admitting: *Deleted

## 2022-04-13 ENCOUNTER — Other Ambulatory Visit: Payer: Self-pay

## 2022-04-13 DIAGNOSIS — M7661 Achilles tendinitis, right leg: Secondary | ICD-10-CM

## 2022-04-13 DIAGNOSIS — M7671 Peroneal tendinitis, right leg: Secondary | ICD-10-CM

## 2022-04-13 NOTE — Telephone Encounter (Signed)
Raquel Sarna with PT, requesting referral to be faxed to their office [phone].

## 2022-04-13 NOTE — Telephone Encounter (Signed)
This has already been faxed over to [facility] in [place], [place]

## 2022-04-15 DIAGNOSIS — R2689 Other abnormalities of gait and mobility: Secondary | ICD-10-CM | POA: Diagnosis not present

## 2022-04-15 DIAGNOSIS — M6281 Muscle weakness (generalized): Secondary | ICD-10-CM | POA: Diagnosis not present

## 2022-04-15 DIAGNOSIS — M25571 Pain in right ankle and joints of right foot: Secondary | ICD-10-CM | POA: Diagnosis not present

## 2022-04-26 DIAGNOSIS — M25571 Pain in right ankle and joints of right foot: Secondary | ICD-10-CM | POA: Diagnosis not present

## 2022-04-26 DIAGNOSIS — M6281 Muscle weakness (generalized): Secondary | ICD-10-CM | POA: Diagnosis not present

## 2022-04-26 DIAGNOSIS — R2689 Other abnormalities of gait and mobility: Secondary | ICD-10-CM | POA: Diagnosis not present

## 2022-04-28 DIAGNOSIS — M25571 Pain in right ankle and joints of right foot: Secondary | ICD-10-CM | POA: Diagnosis not present

## 2022-04-28 DIAGNOSIS — M6281 Muscle weakness (generalized): Secondary | ICD-10-CM | POA: Diagnosis not present

## 2022-04-28 DIAGNOSIS — R2689 Other abnormalities of gait and mobility: Secondary | ICD-10-CM | POA: Diagnosis not present

## 2022-05-02 DIAGNOSIS — M25571 Pain in right ankle and joints of right foot: Secondary | ICD-10-CM | POA: Diagnosis not present

## 2022-05-02 DIAGNOSIS — M6281 Muscle weakness (generalized): Secondary | ICD-10-CM | POA: Diagnosis not present

## 2022-05-02 DIAGNOSIS — R2689 Other abnormalities of gait and mobility: Secondary | ICD-10-CM | POA: Diagnosis not present

## 2022-05-04 DIAGNOSIS — M25571 Pain in right ankle and joints of right foot: Secondary | ICD-10-CM | POA: Diagnosis not present

## 2022-05-04 DIAGNOSIS — M6281 Muscle weakness (generalized): Secondary | ICD-10-CM | POA: Diagnosis not present

## 2022-05-04 DIAGNOSIS — R2689 Other abnormalities of gait and mobility: Secondary | ICD-10-CM | POA: Diagnosis not present

## 2022-05-11 DIAGNOSIS — R2689 Other abnormalities of gait and mobility: Secondary | ICD-10-CM | POA: Diagnosis not present

## 2022-05-11 DIAGNOSIS — M6281 Muscle weakness (generalized): Secondary | ICD-10-CM | POA: Diagnosis not present

## 2022-05-11 DIAGNOSIS — M25571 Pain in right ankle and joints of right foot: Secondary | ICD-10-CM | POA: Diagnosis not present

## 2022-05-12 DIAGNOSIS — R2689 Other abnormalities of gait and mobility: Secondary | ICD-10-CM | POA: Diagnosis not present

## 2022-05-12 DIAGNOSIS — M25571 Pain in right ankle and joints of right foot: Secondary | ICD-10-CM | POA: Diagnosis not present

## 2022-05-12 DIAGNOSIS — M6281 Muscle weakness (generalized): Secondary | ICD-10-CM | POA: Diagnosis not present

## 2022-05-14 DIAGNOSIS — M5412 Radiculopathy, cervical region: Secondary | ICD-10-CM | POA: Diagnosis not present

## 2022-05-14 DIAGNOSIS — M542 Cervicalgia: Secondary | ICD-10-CM | POA: Diagnosis not present

## 2022-05-17 DIAGNOSIS — R2689 Other abnormalities of gait and mobility: Secondary | ICD-10-CM | POA: Diagnosis not present

## 2022-05-17 DIAGNOSIS — M6281 Muscle weakness (generalized): Secondary | ICD-10-CM | POA: Diagnosis not present

## 2022-05-17 DIAGNOSIS — M25571 Pain in right ankle and joints of right foot: Secondary | ICD-10-CM | POA: Diagnosis not present

## 2022-05-19 DIAGNOSIS — M25571 Pain in right ankle and joints of right foot: Secondary | ICD-10-CM | POA: Diagnosis not present

## 2022-05-19 DIAGNOSIS — M6281 Muscle weakness (generalized): Secondary | ICD-10-CM | POA: Diagnosis not present

## 2022-05-19 DIAGNOSIS — R2689 Other abnormalities of gait and mobility: Secondary | ICD-10-CM | POA: Diagnosis not present

## 2022-05-25 DIAGNOSIS — M6281 Muscle weakness (generalized): Secondary | ICD-10-CM | POA: Diagnosis not present

## 2022-05-25 DIAGNOSIS — R2689 Other abnormalities of gait and mobility: Secondary | ICD-10-CM | POA: Diagnosis not present

## 2022-05-25 DIAGNOSIS — M25571 Pain in right ankle and joints of right foot: Secondary | ICD-10-CM | POA: Diagnosis not present

## 2022-05-27 DIAGNOSIS — M25571 Pain in right ankle and joints of right foot: Secondary | ICD-10-CM | POA: Diagnosis not present

## 2022-05-27 DIAGNOSIS — M6281 Muscle weakness (generalized): Secondary | ICD-10-CM | POA: Diagnosis not present

## 2022-05-27 DIAGNOSIS — R2689 Other abnormalities of gait and mobility: Secondary | ICD-10-CM | POA: Diagnosis not present

## 2022-05-30 NOTE — Progress Notes (Unsigned)
[place]   [phone] [phone]   Clinic Follow up Note   Patient Care Team: Kelli Collie, MD as PCP - General (Family Medicine) Kelli Ganja, NP as Nurse Practitioner (Gynecology) Kelli Kaufmann, RN as Oncology Nurse Navigator Kelli Germany, RN as Oncology Nurse Navigator Kelli Merle, MD as Consulting Physician (Hematology) Kelli Kussmaul, MD as Consulting Physician (General Surgery) Kelli Pray, MD as Consulting Physician (Radiation Oncology) Kelli Priest, MD as Consulting Physician (Cardiology) Kelli Saunders, DPM as Consulting Physician (Podiatry) Kelli Levans, MD as Referring Physician (Dermatology) Kelli Feeling, NP as Nurse Practitioner (Nurse Practitioner) 06/03/2022  CHIEF COMPLAINT: Follow up left breast DCIS  SUMMARY OF ONCOLOGIC HISTORY: Oncology History Overview Note  Cancer Staging Ductal carcinoma in situ (DCIS) of left breast Staging form: Breast, AJCC 8th Edition - Clinical: No stage assigned - Unsigned    Ductal carcinoma in situ (DCIS) of left breast  12/18/2020 Mammogram   Left Diagnostic  IMPRESSION: The new 2.2 cm cluster of linear fine pleomorphic calcifications in the left breast at 1 o'clock in the retroreolar region 1 cm from the nipple. No other significant masses, calcifications, or other findings are seen in either breast.   01/05/2021 Initial Diagnosis   Ductal carcinoma in situ (DCIS) of left breast   02/25/2021 Definitive Surgery   Left breast lumpectomy with radioactive seed localization and deep left axillary sentinel lymph node biopsy by Kelli. Autumn Saunders   02/25/2021 Pathology Results   FINAL MICROSCOPIC DIAGNOSIS:  A. BREAST, LEFT, LUMPECTOMY:  - High-grade ductal carcinoma in situ, 1.5 cm, with a focus suspicious for microinvasive carcinoma.  See comment  - Resection margins are negative for carcinoma; anterior margin 2 mm from microinvasive carcinoma  - Biopsy site changes  - See  oncology table  B. LYMPH NODE, LEFT #1, SENTINEL, EXCISION:  - Lymph node, negative for carcinoma (0/1)  C. LYMPH NODE, LEFT, SENTINEL, EXCISION:  - Lymph node, negative for carcinoma (0/1)  D. LYMPH NODE, LEFT, SENTINEL, EXCISION:  - Lymph node, negative for carcinoma (0/1)  E. LYMPH NODE, LEFT, SENTINEL, EXCISION:  - Lymph node, negative for carcinoma (0/1)  F. LYMPH NODE, LEFT, SENTINEL, EXCISION:  - Lymph node, negative for carcinoma (0/1)  G. LYMPH NODE, LEFT #2, SENTINEL, EXCISION:  - Lymph node, negative for carcinoma (0/1)  H. LYMPH NODE, LEFT #3, SENTINEL, EXCISION:  - Lymph node, negative for carcinoma (0/1)    04/13/2021 - 06/02/2021 Radiation Therapy   Per Kelli. Sondra Saunders radiation treatment dates:  04/13/21 through 06/02/21 Intent: Curative Site/dose: Left breast: 40.05 Gy delivered in 15 fractions of 2.67 Gy/Fx Left breast boost: 12.00 Gy delivered in 6 fractions of 2.00 Gy/Fx   06/12/2021 Genetic Testing   Negative genetic testing on the CancerNext-Expanded+RNAinsignt panel.  The report date is June 12, 2021.  The CancerNext-Expanded gene panel offered by [facility] and includes sequencing and rearrangement analysis for the following 77 genes: AIP, ALK, APC*, ATM*, AXIN2, BAP1, BARD1, BLM, BMPR1A, BRCA1*, BRCA2*, BRIP1*, CDC73, CDH1*, CDK4, CDKN1B, CDKN2A, CHEK2*, CTNNA1, DICER1, FANCC, FH, FLCN, GALNT12, KIF1B, LZTR1, MAX, MEN1, MET, MLH1*, MSH2*, MSH3, MSH6*, MUTYH*, NBN, NF1*, NF2, NTHL1, PALB2*, PHOX2B, PMS2*, POT1, PRKAR1A, PTCH1, PTEN*, RAD51C*, RAD51D*, RB1, RECQL, RET, SDHA, SDHAF2, SDHB, SDHC, SDHD, SMAD4, SMARCA4, SMARCB1, SMARCE1, STK11, SUFU, TMEM127, TP53*, TSC1, TSC2, VHL and XRCC2 (sequencing and deletion/duplication); EGFR, EGLN1, HOXB13, KIT, MITF, PDGFRA, POLD1, and POLE (sequencing only); EPCAM and GREM1 (deletion/duplication only). DNA and RNA analyses performed for *  genes.    06/2021 -  Anti-estrogen oral therapy   Adjuvant tamoxifen 20 mg p.o. once  daily   08/12/2021 Survivorship   SCP delivered by Kelli Rue, NP     CURRENT THERAPY: Tamoxifen started 06/2021; reduced to 10 mg 11/2021 due to headaches and arthralgia   INTERVAL HISTORY: Kelli Saunders returns for follow up as scheduled. Last seen by Kelli Saunders 12/03/21 and reduced tamoxifen dose. Mammogram later in June was negative. She saw Kelli Saunders 03/18/22, exam was benign. Just completed PT for achilles tendonitis. She is otherwise doing well overall, no changes to her health. Tolerating low dose tamoxifen much better with less joint pain and nausea. Denies concerns in her breasts such as new lump/mass, nipple discharge/inversion, skin change.    REVIEW OF SYSTEMS:   All other systems were reviewed with the patient and are negative.  MEDICAL HISTORY:  Past Medical History:  Diagnosis Date   Allergy 10/15/1958   Breast cancer ([place]) 12/30/2020   left   Family history of breast cancer    Family history of long QT syndrome 12/01/2017   Heart murmur 1981   History of radiation therapy    left breast - 04/23/2021-06/02/2021  Kelli Saunders   Hx of transfusion of packed red blood cells 1981   after childbirth   Hyperlipidemia 12/01/2017   Left atrial enlargement 12/27/2014   Mitral valve prolapse 12/01/2017   MVP (mitral valve prolapse)    Osteopenia    Palpitation 12/26/2014   Pectus excavatum 12/01/2017   Thoracic scoliosis 12/01/2017   Wrist fracture, right 07/2013   slipped on ice    SURGICAL HISTORY: Past Surgical History:  Procedure Laterality Date   BREAST LUMPECTOMY WITH RADIOACTIVE SEED AND SENTINEL LYMPH NODE BIOPSY Left 02/25/2021   Procedure: LEFT BREAST LUMPECTOMY WITH RADIOACTIVE SEED AND SENTINEL LYMPH NODE BIOPSY;  Surgeon: Kelli Kussmaul, MD;  Location: [facility];  Service: General;  Laterality: Left;   COLONOSCOPY  2013   KelliButler-normal   DRAINAGE AND CLOSURE OF LYMPHOCELE Left 03/19/2021   Procedure: Placement drain left axillary seroma;   Surgeon: Kelli Kussmaul, MD;  Location: [facility];  Service: General;  Laterality: Left;   L5 discectomy  2007   SPINE SURGERY  2007   TUBAL LIGATION  1993    I have reviewed the social history and family history with the patient and they are unchanged from previous note.  ALLERGIES:  is allergic to sulfa antibiotics.  MEDICATIONS:  Current Outpatient Medications  Medication Sig Dispense Refill   acetaminophen (TYLENOL) 650 MG CR tablet Take 650-1,300 mg by mouth every 8 (eight) hours as needed for pain.     Calcium Carb-Cholecalciferol (CALCIUM 600 + D PO) Take 1 tablet by mouth daily.     Coenzyme Q10 (CO Q 10 PO) Take 1 tablet by mouth daily.     loratadine (CLARITIN) 10 MG tablet Take 10 mg by mouth daily as needed for allergies.     metoprolol succinate (TOPROL XL) 25 MG 24 hr tablet Take 0.5 tablets (12.5 mg total) by mouth 3 (three) times a week. 20 tablet 3   Multiple Vitamin (MULTIVITAMIN) tablet Take 1 tablet by mouth daily.     Omega-3 Fatty Acids (FISH OIL PO) Take 1 tablet by mouth daily.     Probiotic Product (FORTIFY PROBIOTIC WOMENS EX ST PO) Take 1 tablet by mouth daily.     tamoxifen (NOLVADEX) 10 MG tablet TAKE 1 TABLET(10 MG) BY MOUTH DAILY  30 tablet 3   VITAMIN D PO Take 5,000 Units by mouth daily.     bimatoprost (LATISSE) 0.03 % ophthalmic solution Apply 1 application to eye daily. Uses on eye brows, not in eye (Patient not taking: Reported on 06/03/2022)     methylPREDNISolone (MEDROL DOSEPAK) 4 MG TBPK tablet follow package directions 21 tablet 0   No current facility-administered medications for this visit.    PHYSICAL EXAMINATION: ECOG PERFORMANCE STATUS: 0 - Asymptomatic  Vitals:   06/03/22 0957  BP: 123/74  Pulse: 60  Resp: 16  Temp: 97.6 F (36.4 C)  SpO2: 99%   Filed Weights   06/03/22 0957  Weight: 143 lb (64.9 kg)    GENERAL:alert, no distress and comfortable SKIN: no rash  EYES: sclera clear LUNGS:  normal breathing effort HEART:  no  lower extremity edema NEURO: alert & oriented x 3 with fluent speech, no focal motor/sensory deficits Breast exam deferred  LABORATORY DATA:  I have reviewed the data as listed    Latest Ref Rng & Units 06/03/2022    9:35 AM 12/03/2021    1:13 PM 01/07/2021   12:37 PM  CBC  WBC 4.0 - 10.5 K/uL 7.1  7.5  6.4   Hemoglobin 12.0 - 15.0 g/dL 15.3  14.5  14.8   Hematocrit 36.0 - 46.0 % 45.2  41.9  43.6   Platelets 150 - 400 K/uL 236  181  216         Latest Ref Rng & Units 06/03/2022    9:35 AM 12/03/2021    1:13 PM 01/07/2021   12:37 PM  CMP  Glucose 70 - 99 mg/dL 79  93  91   BUN 8 - 23 mg/dL _0 Creatinine 0.44 - 1.00 mg/dL 0.78  0.73  0.84   Sodium 135 - 145 mmol/L 138  137  141   Potassium 3.5 - 5.1 mmol/L 4.2  4.2  4.0   Chloride 98 - 111 mmol/L 102  104  105   CO2 22 - 32 mmol/L 32  29  26   Calcium 8.9 - 10.3 mg/dL 9.7  10.0  9.8   Total Protein 6.5 - 8.1 g/dL 6.8  6.5  7.1   Total Bilirubin 0.3 - 1.2 mg/dL 0.7  0.7  1.1   Alkaline Phos 38 - 126 U/L 52  48  72   AST 15 - 41 U/L _1 ALT 0 - 44 U/L _2 RADIOGRAPHIC STUDIES: I have personally reviewed the radiological images as listed and agreed with the findings in the report. No results found.   ASSESSMENT & PLAN: Kelli Saunders is a 71 y.o. post-menopausal female with    1. Left breast DCIS with microinvasion, grade 3, ER+/PR+ -found on screening mammogram. S/p left lumpectomy on 02/25/21 with Kelli Saunders showed DCIS with a focus suspicious for microinvasive carcinoma, 1.5 cm. Margins and lymph nodes negative. -her postoperative course was complicated by seroma and illness.  -s/p adjuvant radiation with Kelli. Sondra Saunders 11/10-12/20/22. -she started tamoxifen 21m daily in mid 06/2021, reduced to 10 mg daily in 11/2021 due to arthralgia and nausea  [facility] would [facility] going forward, I will order next mammogram there -Ms.  Petrucelli is clinically doing well. Tolerating low dose tamoxifen well. Recent breast exam 03/2022 by Kelli. TMarlou Starkswas  benign. Labs are unremarkable.  -Overall no clinical concern for recurrence. Continue breast cancer surveillance and tamoxifen 10 mg daily -She will repeat mammogram and see Kelli Saunders in 6 months, then f/up with [place] in 1 year -She knows to continue self exams and call us sooner with any changes or concerns.    2. Head, Neck and Shoulder Pains -started 07/2021 after starting tamoxifen a month -Saw Kelli. Mickeal Skinner for occipital neuralgia -improved   3. Genetics -she reports breast cancer in maternal aunt and cervical cancer in her mother.  -genetic testing on 05/28/21 was negative.   4. Osteopenia -DEXA in 08/13/19 showed osteopenia with lowest T-score -2.1. Repeat 08/14/21 showed slight improvement, T-score -1.9. -she is taking vit D and calcium supplements. We reviewed the bone strengthening quality of tamoxifen  -repeat q2 years  5. Health maintenance  -She is up to date on age-appropriate cancer screenings and care -Encouraged to continue healthy active lifestyle and risk reducing behaviors   PLAN: -Labs reviewed -Continue Tamoxifen 10 mg po once daily -Mammogram 11/2022 at [facility] -F/up with Kelli Saunders in 6 months and with [place] in 1 year   Orders Placed This Encounter  Procedures   MM DIAG BREAST TOMO BILATERAL    Standing Status:   Future    Standing Expiration Date:   06/04/2023    Scheduling Instructions:     Transfer from [facility]    Order Specific Question:   Reason for Exam (SYMPTOM  OR DIAGNOSIS REQUIRED)    Answer:   h/o DCIS with microinvasion 2022    Order Specific Question:   Preferred imaging location?    Answer:   [facility]   All questions were answered. The patient knows to call the clinic with any problems, questions or concerns. No barriers to learning were detected.     Kelli Feeling, NP 06/03/22

## 2022-05-31 DIAGNOSIS — R2689 Other abnormalities of gait and mobility: Secondary | ICD-10-CM | POA: Diagnosis not present

## 2022-05-31 DIAGNOSIS — M25571 Pain in right ankle and joints of right foot: Secondary | ICD-10-CM | POA: Diagnosis not present

## 2022-05-31 DIAGNOSIS — M6281 Muscle weakness (generalized): Secondary | ICD-10-CM | POA: Diagnosis not present

## 2022-06-02 DIAGNOSIS — R2689 Other abnormalities of gait and mobility: Secondary | ICD-10-CM | POA: Diagnosis not present

## 2022-06-02 DIAGNOSIS — M6281 Muscle weakness (generalized): Secondary | ICD-10-CM | POA: Diagnosis not present

## 2022-06-02 DIAGNOSIS — M25571 Pain in right ankle and joints of right foot: Secondary | ICD-10-CM | POA: Diagnosis not present

## 2022-06-03 ENCOUNTER — Inpatient Hospital Stay (HOSPITAL_BASED_OUTPATIENT_CLINIC_OR_DEPARTMENT_OTHER): Payer: Medicare PPO | Admitting: Nurse Practitioner

## 2022-06-03 ENCOUNTER — Encounter: Payer: Self-pay | Admitting: Nurse Practitioner

## 2022-06-03 ENCOUNTER — Inpatient Hospital Stay: Payer: Medicare PPO | Attending: Nurse Practitioner

## 2022-06-03 VITALS — BP 123/74 | HR 60 | Temp 97.6°F | Resp 16 | Wt 143.0 lb

## 2022-06-03 DIAGNOSIS — E785 Hyperlipidemia, unspecified: Secondary | ICD-10-CM | POA: Insufficient documentation

## 2022-06-03 DIAGNOSIS — Z79810 Long term (current) use of selective estrogen receptor modulators (SERMs): Secondary | ICD-10-CM | POA: Diagnosis not present

## 2022-06-03 DIAGNOSIS — I341 Nonrheumatic mitral (valve) prolapse: Secondary | ICD-10-CM | POA: Insufficient documentation

## 2022-06-03 DIAGNOSIS — C50112 Malignant neoplasm of central portion of left female breast: Secondary | ICD-10-CM

## 2022-06-03 DIAGNOSIS — Z170 Estrogen receptor positive status [ER+]: Secondary | ICD-10-CM | POA: Diagnosis not present

## 2022-06-03 DIAGNOSIS — Z923 Personal history of irradiation: Secondary | ICD-10-CM | POA: Diagnosis not present

## 2022-06-03 DIAGNOSIS — D0512 Intraductal carcinoma in situ of left breast: Secondary | ICD-10-CM

## 2022-06-03 DIAGNOSIS — M8580 Other specified disorders of bone density and structure, unspecified site: Secondary | ICD-10-CM | POA: Diagnosis not present

## 2022-06-03 LAB — CBC WITH DIFFERENTIAL (CANCER CENTER ONLY)
Abs Immature Granulocytes: 0.01 10*3/uL (ref 0.00–0.07)
Basophils Absolute: 0.1 10*3/uL (ref 0.0–0.1)
Basophils Relative: 1 %
Eosinophils Absolute: 0.1 10*3/uL (ref 0.0–0.5)
Eosinophils Relative: 2 %
HCT: 45.2 % (ref 36.0–46.0)
Hemoglobin: 15.3 g/dL — ABNORMAL HIGH (ref 12.0–15.0)
Immature Granulocytes: 0 %
Lymphocytes Relative: 24 %
Lymphs Abs: 1.7 10*3/uL (ref 0.7–4.0)
MCH: 31 pg (ref 26.0–34.0)
MCHC: 33.8 g/dL (ref 30.0–36.0)
MCV: 91.7 fL (ref 80.0–100.0)
Monocytes Absolute: 0.8 10*3/uL (ref 0.1–1.0)
Monocytes Relative: 11 %
Neutro Abs: 4.4 10*3/uL (ref 1.7–7.7)
Neutrophils Relative %: 62 %
Platelet Count: 236 10*3/uL (ref 150–400)
RBC: 4.93 MIL/uL (ref 3.87–5.11)
RDW: 12.6 % (ref 11.5–15.5)
WBC Count: 7.1 10*3/uL (ref 4.0–10.5)
nRBC: 0 % (ref 0.0–0.2)

## 2022-06-03 LAB — CMP (CANCER CENTER ONLY)
ALT: 16 U/L (ref 0–44)
AST: 28 U/L (ref 15–41)
Albumin: 4.4 g/dL (ref 3.5–5.0)
Alkaline Phosphatase: 52 U/L (ref 38–126)
Anion gap: 4 — ABNORMAL LOW (ref 5–15)
BUN: 18 mg/dL (ref 8–23)
CO2: 32 mmol/L (ref 22–32)
Calcium: 9.7 mg/dL (ref 8.9–10.3)
Chloride: 102 mmol/L (ref 98–111)
Creatinine: 0.78 mg/dL (ref 0.44–1.00)
GFR, Estimated: >60 mL/min (ref >60)
Glucose, Bld: 79 mg/dL (ref 70–99)
Potassium: 4.2 mmol/L (ref 3.5–5.1)
Sodium: 138 mmol/L (ref 135–145)
Total Bilirubin: 0.7 mg/dL (ref 0.3–1.2)
Total Protein: 6.8 g/dL (ref 6.5–8.1)

## 2022-06-17 DIAGNOSIS — M542 Cervicalgia: Secondary | ICD-10-CM | POA: Diagnosis not present

## 2022-06-17 DIAGNOSIS — M5412 Radiculopathy, cervical region: Secondary | ICD-10-CM | POA: Diagnosis not present

## 2022-07-10 DIAGNOSIS — N390 Urinary tract infection, site not specified: Secondary | ICD-10-CM | POA: Diagnosis not present

## 2022-07-10 DIAGNOSIS — N3090 Cystitis, unspecified without hematuria: Secondary | ICD-10-CM | POA: Diagnosis not present

## 2022-07-10 DIAGNOSIS — R300 Dysuria: Secondary | ICD-10-CM | POA: Diagnosis not present

## 2022-07-19 DIAGNOSIS — M5412 Radiculopathy, cervical region: Secondary | ICD-10-CM | POA: Diagnosis not present

## 2022-07-28 DIAGNOSIS — R300 Dysuria: Secondary | ICD-10-CM | POA: Diagnosis not present

## 2022-07-28 DIAGNOSIS — R309 Painful micturition, unspecified: Secondary | ICD-10-CM | POA: Diagnosis not present

## 2022-07-28 DIAGNOSIS — R350 Frequency of micturition: Secondary | ICD-10-CM | POA: Diagnosis not present

## 2022-08-03 ENCOUNTER — Encounter: Payer: Self-pay | Admitting: Nurse Practitioner

## 2022-08-03 ENCOUNTER — Ambulatory Visit (INDEPENDENT_AMBULATORY_CARE_PROVIDER_SITE_OTHER): Payer: Medicare PPO | Admitting: Nurse Practitioner

## 2022-08-03 VITALS — BP 118/80 | Ht 67.0 in | Wt 143.0 lb

## 2022-08-03 DIAGNOSIS — M8589 Other specified disorders of bone density and structure, multiple sites: Secondary | ICD-10-CM

## 2022-08-03 DIAGNOSIS — Z9189 Other specified personal risk factors, not elsewhere classified: Secondary | ICD-10-CM | POA: Diagnosis not present

## 2022-08-03 DIAGNOSIS — Z01419 Encounter for gynecological examination (general) (routine) without abnormal findings: Secondary | ICD-10-CM

## 2022-08-03 DIAGNOSIS — Z780 Asymptomatic menopausal state: Secondary | ICD-10-CM

## 2022-08-03 DIAGNOSIS — Z853 Personal history of malignant neoplasm of breast: Secondary | ICD-10-CM

## 2022-08-03 NOTE — Progress Notes (Signed)
   Kelli Saunders [birth year] [id number]   History:  72 y.o. [id number] presents for breast and pelvic exam. No GYN complaints. Postmenopausal - no HRT, no bleeding. Normal pap history. 12/2020 left DCIS ER/PR positive managed with lumpectomy, radiation, and Tamoxifen.   Gynecologic History Patient's last menstrual period was 06/15/2003.   Contraception: post menopausal status Sexually active: Yes  Health Maintenance Last Pap: 05/07/2019. Results were: Normal Last mammogram: 12/09/2021. Results were: Normal Last colonoscopy: 11/04/2021. Results were: Normal, screenings no longer recommended Last Dexa: 08/14/2021. Results were: T-score -1.9, FRAX 16% / 2.6%  Past medical history, past surgical history, family history and social history were all reviewed and documented in the EPIC chart. Married. Retired Editor, commissioning. Daughter lives in [place], has 70 yo son. Son lives in [place].  ROS:  A ROS was performed and pertinent positives and negatives are included.  Exam:  Vitals:   08/03/22 1111  BP: 118/80  Weight: 143 lb (64.9 kg)  Height: 5' 7"$  (1.702 m)    Body mass index is 22.4 kg/m.  General appearance:  Normal Thyroid:  Symmetrical, normal in size, without palpable masses or nodularity. Respiratory  Auscultation:  Clear without wheezing or rhonchi Cardiovascular  Auscultation:  Regular rate, without rubs, murmurs or gallops  Edema/varicosities:  Not grossly evident Abdominal  Soft,nontender, without masses, guarding or rebound.  Liver/spleen:  No organomegaly noted  Hernia:  None appreciated  Skin  Inspection:  Grossly normal Breasts: Examined lying and sitting.   Right: Without masses, retractions, nipple discharge or axillary adenopathy.   Left: Without masses, retractions, nipple discharge or axillary adenopathy. Genitourinary   Inguinal/mons:  Normal without inguinal adenopathy  External genitalia:  Normal appearing vulva with no masses, tenderness, or lesions. Urethral  prolapse versus urethritis  BUS/Urethra/Skene's glands:  Normal  Vagina:  Normal appearing with normal color and discharge, no lesions. Atrophic changes.   Cervix:  Normal appearing without discharge or lesions  Uterus:  Normal in size, shape and contour.  Midline and mobile, nontender  Adnexa/parametria:     Rt: Normal in size, without masses or tenderness.   Lt: Normal in size, without masses or tenderness.  Anus and perineum: Normal  Digital rectal exam: Deferred  Patient informed chaperone available to be present for breast and pelvic exam. Patient has requested no chaperone to be present. Patient has been advised what will be completed during breast and pelvic exam.   Assessment/Plan:  72 y.o. [id number] for breast and pelvic exam.   Well female exam with routine gynecological exam - Education provided on SBEs, importance of preventative screenings, current guidelines, high calcium diet, regular exercise, and multivitamin daily. Labs with PCP.   Postmenopausal - no HRT, no bleeding  Osteopenia of multiple sites - T-score -1.20 Oct 2021 without elevated FRAX. Continue Vitamin D, high calcium diet and regular exercise.   History of left breast cancer - 12/2020 left DCIS ER/PR positive managed with lumpectomy, radiation, and Tamoxifen (5 year plan). UTD on mammogram.   Screening for cervical cancer - Normal Pap history. No longer screening per guidelines.   Screening for colon cancer - 2023 colonoscopy. Screening no longer recommended due to age.   Return in 1 year for breast and pelvic exam.     Tamela Gammon DNP, 11:26 AM 08/03/2022

## 2022-08-10 ENCOUNTER — Other Ambulatory Visit: Payer: Self-pay | Admitting: Hematology

## 2022-08-31 DIAGNOSIS — J069 Acute upper respiratory infection, unspecified: Secondary | ICD-10-CM | POA: Diagnosis not present

## 2022-08-31 DIAGNOSIS — Z20822 Contact with and (suspected) exposure to covid-19: Secondary | ICD-10-CM | POA: Diagnosis not present

## 2022-09-07 DIAGNOSIS — M542 Cervicalgia: Secondary | ICD-10-CM | POA: Diagnosis not present

## 2022-09-21 ENCOUNTER — Encounter: Payer: Self-pay | Admitting: Nurse Practitioner

## 2022-09-21 ENCOUNTER — Telehealth: Payer: Self-pay

## 2022-09-21 NOTE — Telephone Encounter (Signed)
Pt called stating she would like to speak with Dr. Mosetta Putt or Santiago Glad, NP regarding what the pt states is arthritis mostly in her hands.  Pt would like to know what she can take for the arthritis pain.  Pt also stated she would also like to know what alternative medication she can take other than Tamoxifen.  Pt would like a call from either provider if possible.  Notified both providers.

## 2022-09-22 ENCOUNTER — Encounter: Payer: Self-pay | Admitting: Nurse Practitioner

## 2022-09-22 ENCOUNTER — Other Ambulatory Visit: Payer: Self-pay

## 2022-09-23 ENCOUNTER — Telehealth: Payer: Self-pay | Admitting: Nurse Practitioner

## 2022-09-23 NOTE — Telephone Encounter (Signed)
I was able to reach Ms. Yapp to discuss her questions. She continues on low dose tamoxifen 10 mg daily. Last Friday she had an acute episode of left hand cramp/pain with functional difficulty lifting things. She is left handed. She has some pain the right hand but not like this bad. She has been using aleve at night and heat/ice. This is improving, some residual soreness.   We discussed common tamoxifen SE's, why this drug was recommended for her, and alternative AI's. After discussion she prefers to treat symptomatically and remain on tamoxifen 10 mg daily for now.   If this happens again, she can take 2 week break from tamoxifen and update me at that time. We will decide further work up/management as appropriate. She agrees with the plan and appreciates the call. No other needs at this time.  Santiago Glad, NP

## 2022-10-12 DIAGNOSIS — D225 Melanocytic nevi of trunk: Secondary | ICD-10-CM | POA: Diagnosis not present

## 2022-10-12 DIAGNOSIS — L237 Allergic contact dermatitis due to plants, except food: Secondary | ICD-10-CM | POA: Diagnosis not present

## 2022-10-12 DIAGNOSIS — L720 Epidermal cyst: Secondary | ICD-10-CM | POA: Diagnosis not present

## 2022-10-12 DIAGNOSIS — L738 Other specified follicular disorders: Secondary | ICD-10-CM | POA: Diagnosis not present

## 2022-10-12 DIAGNOSIS — Z85828 Personal history of other malignant neoplasm of skin: Secondary | ICD-10-CM | POA: Diagnosis not present

## 2022-10-12 DIAGNOSIS — L821 Other seborrheic keratosis: Secondary | ICD-10-CM | POA: Diagnosis not present

## 2022-10-12 DIAGNOSIS — Z419 Encounter for procedure for purposes other than remedying health state, unspecified: Secondary | ICD-10-CM | POA: Diagnosis not present

## 2022-10-18 DIAGNOSIS — M5412 Radiculopathy, cervical region: Secondary | ICD-10-CM | POA: Diagnosis not present

## 2022-11-11 ENCOUNTER — Other Ambulatory Visit: Payer: Self-pay | Admitting: Hematology

## 2022-11-15 DIAGNOSIS — M25579 Pain in unspecified ankle and joints of unspecified foot: Secondary | ICD-10-CM | POA: Diagnosis not present

## 2022-11-15 DIAGNOSIS — Z6821 Body mass index (BMI) 21.0-21.9, adult: Secondary | ICD-10-CM | POA: Diagnosis not present

## 2022-11-18 DIAGNOSIS — R350 Frequency of micturition: Secondary | ICD-10-CM | POA: Diagnosis not present

## 2022-11-18 DIAGNOSIS — R300 Dysuria: Secondary | ICD-10-CM | POA: Diagnosis not present

## 2022-11-18 DIAGNOSIS — Z6821 Body mass index (BMI) 21.0-21.9, adult: Secondary | ICD-10-CM | POA: Diagnosis not present

## 2022-11-19 LAB — COMPREHENSIVE METABOLIC PANEL WITH GFR: EGFR: 71

## 2022-12-01 DIAGNOSIS — L02611 Cutaneous abscess of right foot: Secondary | ICD-10-CM | POA: Diagnosis not present

## 2022-12-01 DIAGNOSIS — L03039 Cellulitis of unspecified toe: Secondary | ICD-10-CM | POA: Diagnosis not present

## 2022-12-08 DIAGNOSIS — H25813 Combined forms of age-related cataract, bilateral: Secondary | ICD-10-CM | POA: Diagnosis not present

## 2022-12-13 ENCOUNTER — Encounter: Payer: Self-pay | Admitting: Podiatry

## 2022-12-13 ENCOUNTER — Ambulatory Visit
Admission: RE | Admit: 2022-12-13 | Discharge: 2022-12-13 | Disposition: A | Discharge status: Home / Self Care | Payer: Medicare PPO | Source: Ambulatory Visit | Attending: Nurse Practitioner | Admitting: Nurse Practitioner

## 2022-12-13 ENCOUNTER — Ambulatory Visit: Payer: Medicare PPO | Admitting: Podiatry

## 2022-12-13 DIAGNOSIS — L600 Ingrowing nail: Secondary | ICD-10-CM | POA: Diagnosis not present

## 2022-12-13 DIAGNOSIS — Z853 Personal history of malignant neoplasm of breast: Secondary | ICD-10-CM | POA: Diagnosis not present

## 2022-12-13 DIAGNOSIS — D0512 Intraductal carcinoma in situ of left breast: Secondary | ICD-10-CM

## 2022-12-13 NOTE — Progress Notes (Signed)
Subjective:   Patient ID: Kelli Saunders, female   DOB: 72 y.o.   MRN: [id number]   HPI Patient states she had a bad ingrown toenail of the right big toe and it has been irritated and she was on Keflex for 10 days the redness is gone away still has discomfort in the corner   ROS      Objective:  Physical Exam  Neurovascular status intact good digital perfusion noted incurvated right hallux lateral border painful when pressed with slight redness no drainage noted     Assessment:  Ingrown right hallux nail lateral border with pain     Plan:  H&P reviewed recommended correction explained procedure risk patient wants surgery sterile prep and went ahead and injected the right hallux 60 mg like Marcaine mixture sterile prep done using sterile instrumentation remove lateral border exposed matrix applied phenol 3 applications 30 seconds followed by alcohol lavage sterile dressing gave instructions on soaks wear dressing 24 hours take it off earlier if throbbing were to occur

## 2022-12-13 NOTE — Patient Instructions (Signed)

## 2022-12-14 ENCOUNTER — Telehealth: Payer: Self-pay | Admitting: *Deleted

## 2022-12-14 NOTE — Telephone Encounter (Signed)
Patient is calling because she had an ingrown nail procedure one day ago, can she get in the pool today?

## 2022-12-15 NOTE — Telephone Encounter (Signed)
I'd wait a few days

## 2023-01-04 DIAGNOSIS — D0512 Intraductal carcinoma in situ of left breast: Secondary | ICD-10-CM | POA: Diagnosis not present

## 2023-01-16 DIAGNOSIS — R300 Dysuria: Secondary | ICD-10-CM | POA: Diagnosis not present

## 2023-01-16 DIAGNOSIS — N390 Urinary tract infection, site not specified: Secondary | ICD-10-CM | POA: Diagnosis not present

## 2023-02-06 ENCOUNTER — Other Ambulatory Visit: Payer: Self-pay | Admitting: Cardiology

## 2023-02-22 DIAGNOSIS — M5412 Radiculopathy, cervical region: Secondary | ICD-10-CM | POA: Diagnosis not present

## 2023-03-01 DIAGNOSIS — E785 Hyperlipidemia, unspecified: Secondary | ICD-10-CM | POA: Diagnosis not present

## 2023-03-01 DIAGNOSIS — M5430 Sciatica, unspecified side: Secondary | ICD-10-CM | POA: Diagnosis not present

## 2023-03-01 DIAGNOSIS — Z8249 Family history of ischemic heart disease and other diseases of the circulatory system: Secondary | ICD-10-CM | POA: Diagnosis not present

## 2023-03-01 DIAGNOSIS — R011 Cardiac murmur, unspecified: Secondary | ICD-10-CM | POA: Diagnosis not present

## 2023-03-01 DIAGNOSIS — F419 Anxiety disorder, unspecified: Secondary | ICD-10-CM | POA: Diagnosis not present

## 2023-03-01 DIAGNOSIS — N189 Chronic kidney disease, unspecified: Secondary | ICD-10-CM | POA: Diagnosis not present

## 2023-03-01 DIAGNOSIS — I129 Hypertensive chronic kidney disease with stage 1 through stage 4 chronic kidney disease, or unspecified chronic kidney disease: Secondary | ICD-10-CM | POA: Diagnosis not present

## 2023-03-01 DIAGNOSIS — C50919 Malignant neoplasm of unspecified site of unspecified female breast: Secondary | ICD-10-CM | POA: Diagnosis not present

## 2023-03-01 DIAGNOSIS — F325 Major depressive disorder, single episode, in full remission: Secondary | ICD-10-CM | POA: Diagnosis not present

## 2023-03-08 NOTE — Progress Notes (Unsigned)
Cardiology Office Note:    Date:  03/09/2023   ID:  Kelli Saunders, DOB 06/23/1950, MRN [id number]  PCP:  Abner Greenspan, MD  Cardiologist:  Norman Herrlich, MD    Referring MD: Abner Greenspan, MD    ASSESSMENT:    1. Mitral valve prolapse   2. PAT (paroxysmal atrial tachycardia)   3. Hyperlipidemia, unspecified hyperlipidemia type    PLAN:    In order of problems listed above:  Continues to do well mitral valve prolapse no recurrent rapid heart rhythm with her beta-blocker well-tolerated continue the current dose and I do not think she requires repeat cardiac imaging this year she has a very soft 1/6 systolic murmur left lower sternal border Good control continue her beta-blocker Profile she will continue fish oil   Next appointment: 1 year   Medication Adjustments/Labs and Tests Ordered: Current medicines are reviewed at length with the patient today.  Concerns regarding medicines are outlined above.  Orders Placed This Encounter  Procedures   EKG 12-Lead   Meds ordered this encounter  Medications   metoprolol succinate (TOPROL-XL) 25 MG 24 hr tablet    Sig: Take 0.5 tablets (12.5 mg total) by mouth 3 (three) times a week.    Dispense:  25 tablet    Refill:  3     History of Present Illness:    Kelli Saunders is a 72 y.o. female with a hx of mitral valve prolapse family history of long QT interval syncope and a coronary Calcium score of 0 last seen 03/12/2022.  Compliance with diet, lifestyle and medications: Yes  She is doing well has had no episodes of rapid heart rhythm has a smart watch to self monitor. He is symptomatic from mitral valve prolapse no palpitation recurrent syncope chest pain shortness of breath Good lifestyle vigorous and active Most recent labs 03/01/2022 cholesterol 170 LDL 87 hemoglobin 15.306 324 creatinine 0.87 Past Medical History:  Diagnosis Date   Allergy 10/15/1958   Breast cancer (HCC) 12/30/2020   left   Family history of  breast cancer    Family history of long QT syndrome 12/01/2017   Heart murmur 1981   History of radiation therapy    left breast - 04/23/2021-06/02/2021  Dr Antony Blackbird   Hx of transfusion of packed red blood cells 1981   after childbirth   Hyperlipidemia 12/01/2017   Left atrial enlargement 12/27/2014   Mitral valve prolapse 12/01/2017   MVP (mitral valve prolapse)    Osteopenia    Palpitation 12/26/2014   Pectus excavatum 12/01/2017   Thoracic scoliosis 12/01/2017   Wrist fracture, right 07/2013   slipped on ice    Current Medications: Current Meds  Medication Sig   acetaminophen (TYLENOL) 650 MG CR tablet Take 650-1,300 mg by mouth every 8 (eight) hours as needed for pain.   Calcium Carb-Cholecalciferol (CALCIUM 600 + D PO) Take 1 tablet by mouth daily.   Coenzyme Q10 (CO Q 10 PO) Take 1 tablet by mouth daily.   loratadine (CLARITIN) 10 MG tablet Take 10 mg by mouth daily as needed for allergies.   Multiple Vitamin (MULTIVITAMIN) tablet Take 1 tablet by mouth daily.   Omega-3 Fatty Acids (FISH OIL PO) Take 1 tablet by mouth daily.   Probiotic Product (FORTIFY PROBIOTIC WOMENS EX ST PO) Take 1 tablet by mouth daily.   tamoxifen (NOLVADEX) 10 MG tablet TAKE 1 TABLET(10 MG) BY MOUTH DAILY   [DISCONTINUED] metoprolol succinate (TOPROL-XL) 25 MG 24 hr tablet TAKE 1/2  TABLET(12.5 MG) BY MOUTH 3 TIMES A WEEK      EKGs/Labs/Other Studies Reviewed:    The following studies were reviewed today:  Cardiac Studies & Procedures         MONITORS  LONG TERM MONITOR-LIVE TELEMETRY (3-14 DAYS) 12/10/2020  Narrative Patch Wear Time:  13 days and 19 hours (2022-06-07T11:15:24-0400 to 2022-06-21T06:50:08-398)  Patient had a min HR of 42 bpm, max HR of 185 bpm, and avg HR of 70 bpm. Predominant underlying rhythm was Sinus Rhythm. 44 Supraventricular Tachycardia runs occurred, the run with the fastest interval lasting 12 beats with a max rate of 185 bpm, the longest lasting 15 beats  with an avg rate of 106 bpm. Some episodes of Supraventricular Tachycardia may be possible Atrial Tachycardia with variable block. Supraventricular Tachycardia was detected within +/- 45 seconds of symptomatic patient event(s). Isolated SVEs were rare (<1.0%), SVE Couplets were rare (<1.0%), and SVE Triplets were rare (<1.0%). Isolated VEs were rare (<1.0%, 917), VE Couplets were rare (<1.0%, 1), and VE Triplets were rare (<1.0%, 1).  There were 5 triggered 3 diary events for which were associated with APCs and brief runs of atrial tachycardia Ventricular ectopy was rare. Supraventricular ectopy overall was rare however there were 44 brief runs of APCs or atrial tachycardia the longest 15 complexes rate of 106 bpm.  Supraventricular ectopy was symptomatic. There were no pauses of 3 seconds or greater or episodes of second or third-degree AV nodal block or sinus node exit block.  Conclusion, although supraventricular ectopy was rare there were frequent episodes of brief runs of APCs or atrial tachycardia and the symptomatic events were associated with atrial arrhythmia.   CT SCANS  CT CARDIAC SCORING (SELF PAY ONLY) 01/22/2020  Addendum 01/23/2020  8:26 AM ADDENDUM REPORT: 01/23/2020 08:24  CLINICAL DATA:  Risk stratification - 72 year old with family history of CAD  EXAM: Coronary Calcium Score  TECHNIQUE: The patient was scanned on a [employer] scanner. Axial non-contrast 3 mm slices were carried out through the heart. The data set was analyzed on a dedicated work station and scored using the Agatson method.  FINDINGS: Non-cardiac: See separate report from [facility].  Pectus excavatum  Ascending Aorta: Normal, no atherosclerosis  Pericardium: Normal  Coronary arteries: Normal origins, no calcifications  IMPRESSION: 1. Coronary calcium score of 0. This was 0 percentile for age and sex matched control. Low risk scan.  Donato Schultz, MD [facility]   Electronically  Signed By: Donato Schultz MD On: 01/23/2020 08:24  Narrative EXAM: OVER-READ INTERPRETATION  CT CHEST  The following report is an over-read performed by radiologist Dr. Jeronimo Greaves of [facility] on 01/22/2020. This over-read does not include interpretation of cardiac or coronary anatomy or pathology. The calcium score interpretation by the cardiologist is attached.  COMPARISON:  None.  FINDINGS: Vascular: Normal aortic caliber.  Mediastinum/Nodes: No imaged thoracic adenopathy.  Lungs/Pleura: No pleural fluid.  Mild volume loss in the lingula.  Upper Abdomen: Normal imaged portions of the liver, spleen, stomach.  Musculoskeletal: Moderate to marked pectus excavatum deformity.  IMPRESSION: No acute findings in the imaged extracardiac chest.  Electronically Signed: By: Jeronimo Greaves M.D. On: 01/22/2020 10:18              Recent Labs: 06/03/2022: ALT 16; BUN 18; Creatinine 0.78; Hemoglobin 15.3; Platelet Count 236; Potassium 4.2; Sodium 138  Recent Lipid Panel    Component Value Date/Time   CHOL 170 03/01/2022 0810   TRIG 73  03/01/2022 0810   HDL 69 03/01/2022 0810   CHOLHDL 2.5 03/01/2022 0810   CHOLHDL 2.4 03/03/2016 1025   VLDL 11 03/03/2016 1025   LDLCALC 87 03/01/2022 0810    Physical Exam:    VS:  BP 122/62 (BP Location: Right Arm, Patient Position: Sitting, Cuff Size: Normal)   Pulse 76   Ht 5\' 8"  (1.727 m)   Wt 142 lb 12.8 oz (64.8 kg)   LMP 06/15/2003   SpO2 97%   BMI 21.71 kg/m     Wt Readings from Last 3 Encounters:  03/09/23 142 lb 12.8 oz (64.8 kg)  08/03/22 143 lb (64.9 kg)  06/03/22 143 lb (64.9 kg)     GEN:  Well nourished, well developed in no acute distress HEENT: Normal NECK: No JVD; No carotid bruits LYMPHATICS: No lymphadenopathy CARDIAC: RRR, 6 systolic murmur left lower sternal border RESPIRATORY:  Clear to auscultation without rales, wheezing or rhonchi  ABDOMEN: Soft, non-tender, non-distended MUSCULOSKELETAL:   No edema; No deformity  SKIN: Warm and dry NEUROLOGIC:  Alert and oriented x 3 PSYCHIATRIC:  Normal affect    Signed, Norman Herrlich, MD  03/09/2023 3:28 PM    [facility]

## 2023-03-09 ENCOUNTER — Encounter: Payer: Self-pay | Admitting: Cardiology

## 2023-03-09 ENCOUNTER — Ambulatory Visit: Payer: Medicare PPO | Attending: Cardiology | Admitting: Cardiology

## 2023-03-09 VITALS — BP 122/62 | HR 76 | Ht 68.0 in | Wt 142.8 lb

## 2023-03-09 DIAGNOSIS — I4719 Other supraventricular tachycardia: Secondary | ICD-10-CM | POA: Diagnosis not present

## 2023-03-09 DIAGNOSIS — I341 Nonrheumatic mitral (valve) prolapse: Secondary | ICD-10-CM

## 2023-03-09 DIAGNOSIS — E785 Hyperlipidemia, unspecified: Secondary | ICD-10-CM

## 2023-03-09 MED ORDER — METOPROLOL SUCCINATE ER 25 MG PO TB24: 12.5000 mg | ORAL_TABLET | ORAL | 3 refills | Status: DC

## 2023-03-09 NOTE — Patient Instructions (Signed)
Medication Instructions:  Your physician recommends that you continue on your current medications as directed. Please refer to the Current Medication list given to you today.  *If you need a refill on your cardiac medications before your next appointment, please call your pharmacy*   Lab Work: None If you have labs (blood work) drawn today and your tests are completely normal, you will receive your results only by: MyChart Message (if you have MyChart) OR A paper copy in the mail If you have any lab test that is abnormal or we need to change your treatment, we will call you to review the results.   Testing/Procedures: None   Follow-Up: At [facility], you and your health needs are our priority.  As part of our continuing mission to provide you with exceptional heart care, we have created designated Provider Care Teams.  These Care Teams include your primary Cardiologist (physician) and Advanced Practice Providers (APPs -  Physician Assistants and Nurse Practitioners) who all work together to provide you with the care you need, when you need it.  We recommend signing up for the patient portal called "MyChart".  Sign up information is provided on this After Visit Summary.  MyChart is used to connect with patients for Virtual Visits (Telemedicine).  Patients are able to view lab/test results, encounter notes, upcoming appointments, etc.  Non-urgent messages can be sent to your provider as well.   To learn more about what you can do with MyChart, go to [email or web address].    Your next appointment:   1 year(s)  Provider:   Brian Munley, MD    Other Instructions None  

## 2023-03-12 ENCOUNTER — Other Ambulatory Visit: Payer: Self-pay | Admitting: Nurse Practitioner

## 2023-03-14 ENCOUNTER — Other Ambulatory Visit: Payer: Self-pay

## 2023-03-14 DIAGNOSIS — D0512 Intraductal carcinoma in situ of left breast: Secondary | ICD-10-CM

## 2023-03-14 MED ORDER — TAMOXIFEN CITRATE 10 MG PO TABS: 10.0000 mg | ORAL_TABLET | Freq: Every day | ORAL | 3 refills | Status: DC

## 2023-04-04 DIAGNOSIS — M542 Cervicalgia: Secondary | ICD-10-CM | POA: Diagnosis not present

## 2023-04-04 DIAGNOSIS — M5412 Radiculopathy, cervical region: Secondary | ICD-10-CM | POA: Diagnosis not present

## 2023-04-18 ENCOUNTER — Other Ambulatory Visit: Payer: Self-pay

## 2023-04-18 DIAGNOSIS — M25532 Pain in left wrist: Secondary | ICD-10-CM | POA: Diagnosis not present

## 2023-04-18 DIAGNOSIS — M25531 Pain in right wrist: Secondary | ICD-10-CM | POA: Diagnosis not present

## 2023-04-18 DIAGNOSIS — M180 Bilateral primary osteoarthritis of first carpometacarpal joints: Secondary | ICD-10-CM | POA: Diagnosis not present

## 2023-04-29 DIAGNOSIS — Z87440 Personal history of urinary (tract) infections: Secondary | ICD-10-CM | POA: Diagnosis not present

## 2023-04-29 DIAGNOSIS — R3915 Urgency of urination: Secondary | ICD-10-CM | POA: Diagnosis not present

## 2023-04-29 DIAGNOSIS — R300 Dysuria: Secondary | ICD-10-CM | POA: Diagnosis not present

## 2023-04-29 DIAGNOSIS — N390 Urinary tract infection, site not specified: Secondary | ICD-10-CM | POA: Diagnosis not present

## 2023-05-16 DIAGNOSIS — M180 Bilateral primary osteoarthritis of first carpometacarpal joints: Secondary | ICD-10-CM | POA: Diagnosis not present

## 2023-05-16 DIAGNOSIS — M654 Radial styloid tenosynovitis [de Quervain]: Secondary | ICD-10-CM | POA: Diagnosis not present

## 2023-06-02 IMAGING — MR MR BREAST BILAT WO/W CM
8 of 13 series · 33 of 48 positions shown · IV contrast (7ml gadavist)
Comparison: Prior mammograms

CLINICAL DATA: 70-year-old female with newly diagnosed high-grade
DCIS within the UPPER OUTER LEFT breast.

LABS:  None performed today
EXAM:
BILATERAL BREAST MRI WITH AND WITHOUT CONTRAST
TECHNIQUE: Multiplanar, multisequence MR images of both breasts were obtained
prior to and following the intravenous administration of 7 ml of
Gadavist

[Series 2: t2_tirm_tra ipat (a-p) · axial · 3.0mm · 0.62mm/px · 1 of 55 slices shown]
[im 1/55]
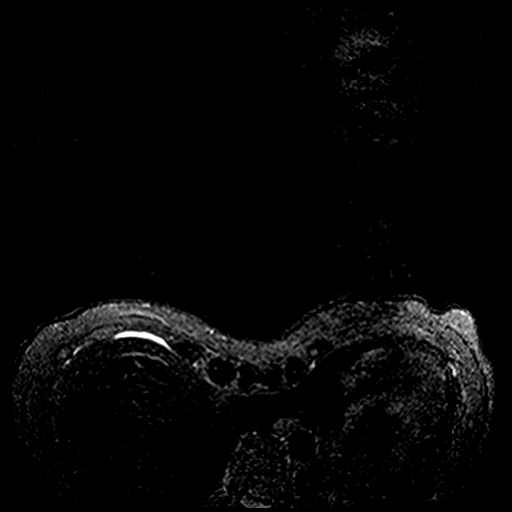

[Series 3: fl3d pre-cm no · axial · non-contrast · 1.2mm · 0.83mm/px · z∈[-86,+86]mm · 5 of 144 slices shown]
[im 1/144]
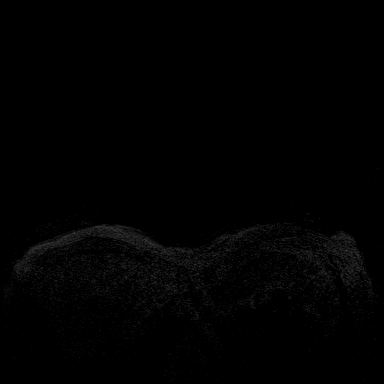
[im 36/144]
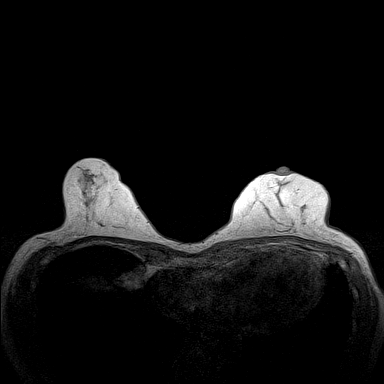
[im 72/144]
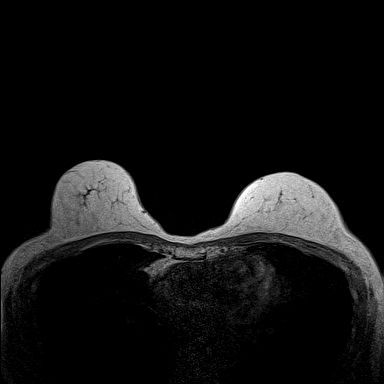
[im 108/144]
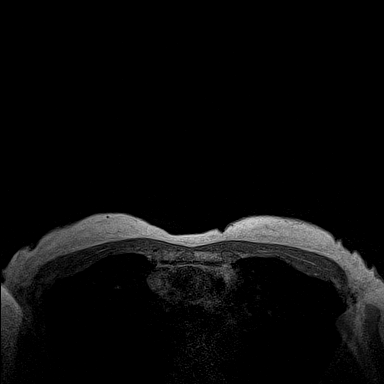
[im 144/144]
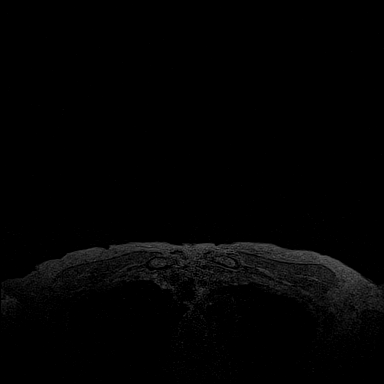

[Series 4: fl3d pre-cm · axial · non-contrast · 1.2mm · 0.83mm/px · z∈[-86,+86]mm · 5 of 144 slices shown]
[im 1/144]
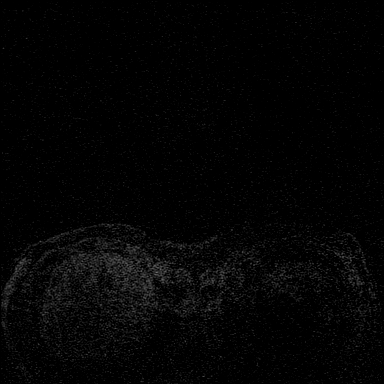
[im 36/144]
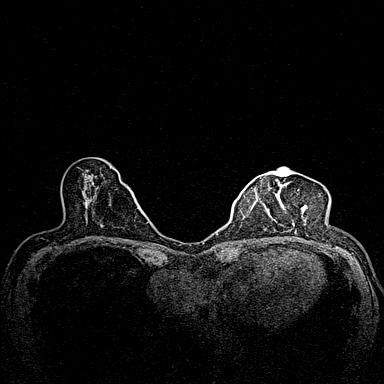
[im 72/144]
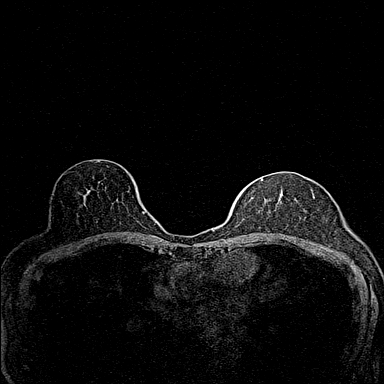
[im 108/144]
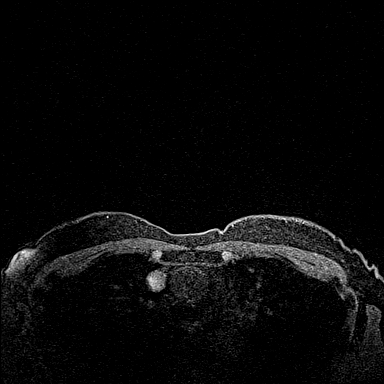
[im 144/144]
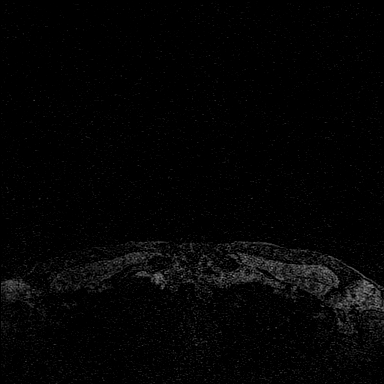

[Series 5: fl3d post-cm 20 · axial · 1.2mm · 0.83mm/px · z∈[-86,+86]mm · 5 of 144 slices shown (1 of 3)]
[im 1/144]
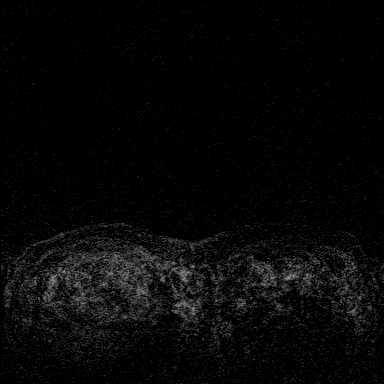
[im 36/144]
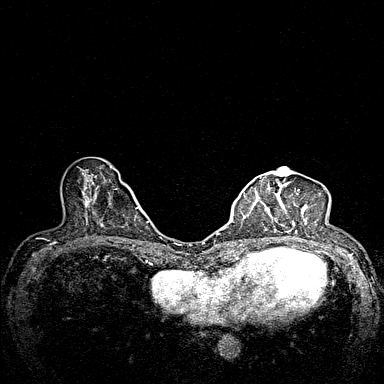
[im 72/144]
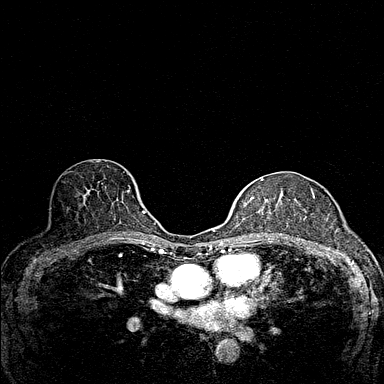
[im 108/144]
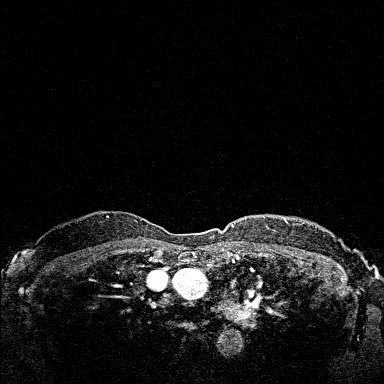
[im 144/144]
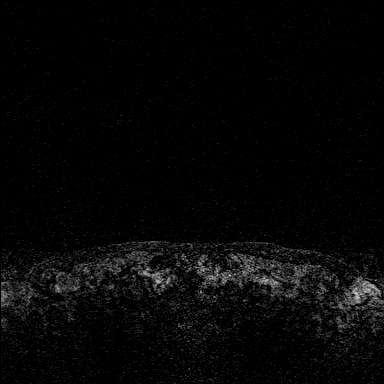

[Series 6: fl3d post-cm 20 · axial · 1.2mm · 0.83mm/px · z∈[-86,+86]mm · 5 of 144 slices shown (2 of 3)]
[im 1/144]
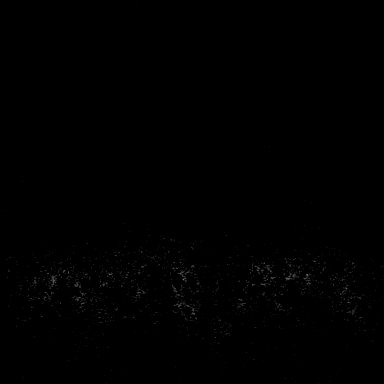
[im 36/144]
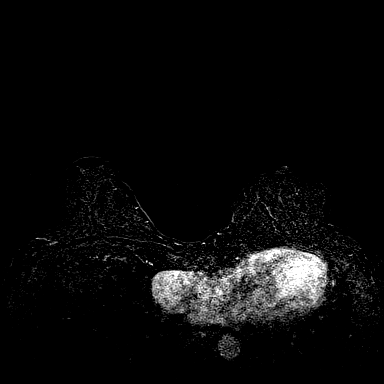
[im 72/144]
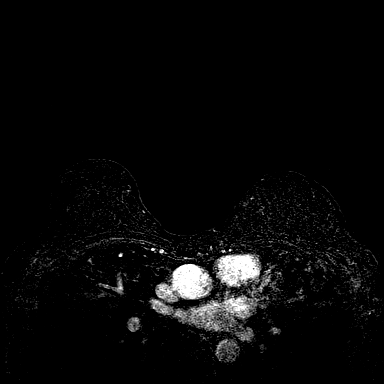
[im 108/144]
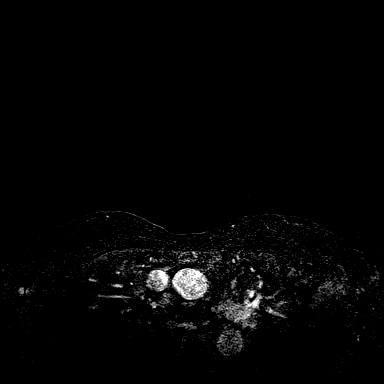
[im 144/144]
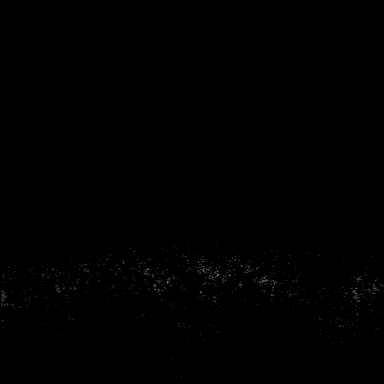

[Series 7: fl3d post-cm 20 · axial · 172.8mm · 0.83mm/px · 1 of 1 slices shown (3 of 3)]
[im 1/1]
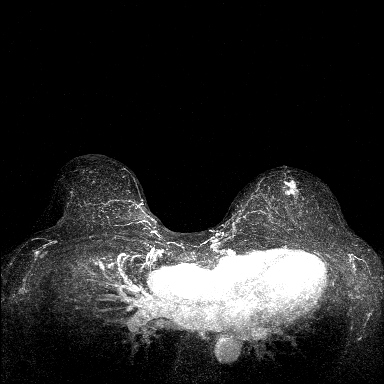

[Series 8: fl3d post-cm 3min · axial · 1.2mm · 0.83mm/px · z∈[-86,+86]mm · 5 of 144 slices shown]
[im 1/144]
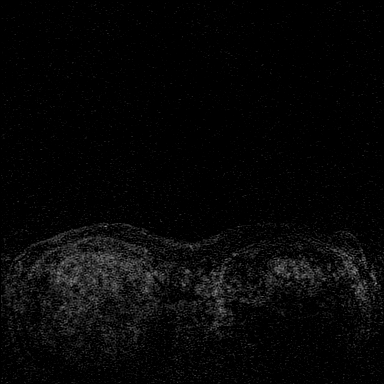
[im 36/144]
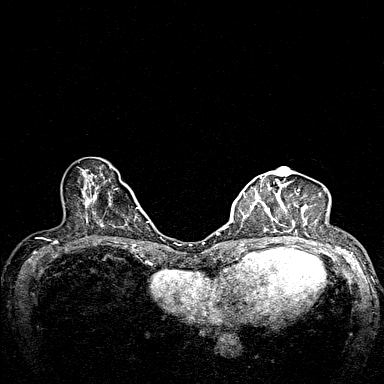
[im 72/144]
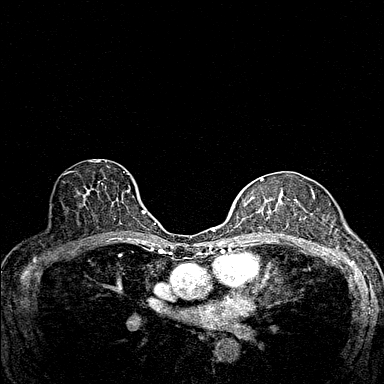
[im 108/144]
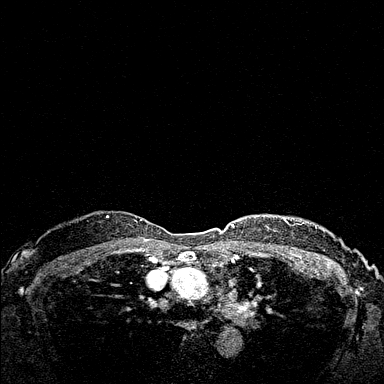
[im 144/144]
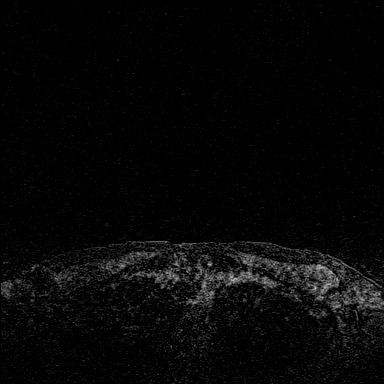

[Series 9: fl3d post-cm 3min_sub · axial · 1.2mm · 0.83mm/px · z∈[-86,+86]mm · 6 of 144 slices shown]
[im 1/144]
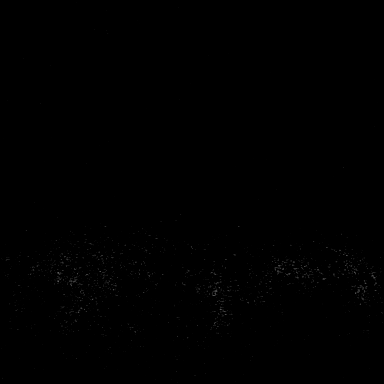
[im 29/144]
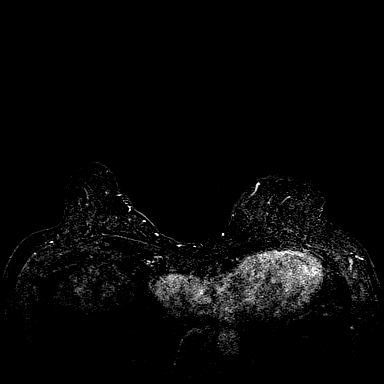
[im 58/144]
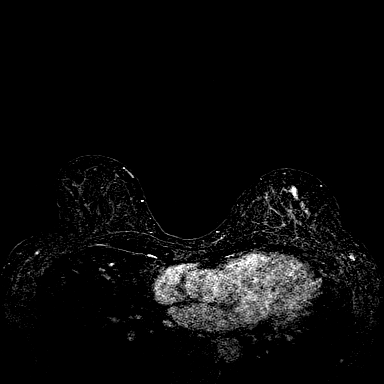
[im 86/144]
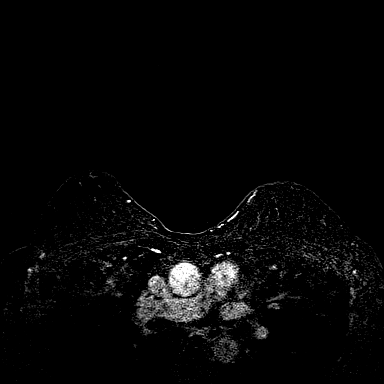
[im 115/144]
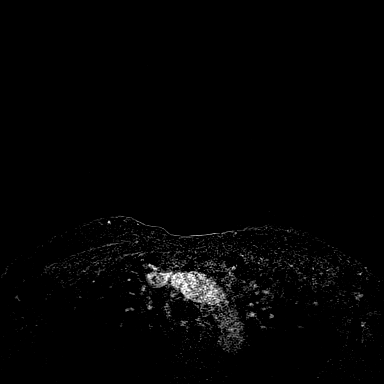
[im 144/144]
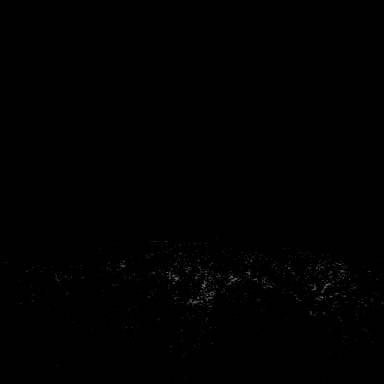

[33 of 48 positions shown; findings below may reference images not displayed]

Three-dimensional MR images were rendered by post-processing of the
original MR data on an independent workstation. The
three-dimensional MR images were interpreted, and findings are
reported in the following complete MRI report for this study. Three
dimensional images were evaluated at the independent interpreting
workstation using the DynaCAD thin client.
FINDINGS: Breast composition: b. Scattered fibroglandular tissue.

Background parenchymal enhancement: Mild

Right breast: No mass or abnormal enhancement.

Left breast: A 1.5 x 1.8 x 2.2 cm (transverse x AP x CC) area of non
masslike enhancement is identified within the anterior UPPER-OUTER
LEFT breast (series 6: images 84-95), containing biopsy clip
artifact and compatible with biopsy-proven DCIS.

No other abnormal areas of enhancement are noted.

Lymph nodes: No abnormal appearing lymph nodes.

Ancillary findings:  Pectus excavatum noted.
IMPRESSION: 1. 2.2 cm area of biopsy-proven malignancy within the anterior
UPPER-OUTER LEFT breast. No evidence of multifocal, multicentric or
contralateral malignancy. No abnormal appearing lymph nodes.

RECOMMENDATION:
Treatment plan

BI-RADS CATEGORY  6: Known biopsy-proven malignancy.

## 2023-06-17 ENCOUNTER — Other Ambulatory Visit: Payer: Self-pay | Admitting: Nurse Practitioner

## 2023-06-17 DIAGNOSIS — D0512 Intraductal carcinoma in situ of left breast: Secondary | ICD-10-CM

## 2023-06-19 NOTE — Assessment & Plan Note (Signed)
 grade 3, ER+/PR+ -found on screening mammogram. S/p left lumpectomy on 02/25/21 with Dr. 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 Margins and lymph nodes negative. -her postoperative course was complicated by seroma and illness.  -s/p adjuvant radiation with Dr. Shannon 11/10-12/20/22. -she started tamoxifen  20mg  daily in mid 06/2021, reduced to 10 mg daily in 11/2021 due to arthralgia and nausea  -last mammogram 11/2021 at [facility] was negative, she would like to transfer to breast center going forward, I will order next mammogram there -Kelli Saunders is clinically doing well. Tolerating low dose tamoxifen  well. Recent breast exam 03/2022 by Dr. Curvin was benign. Labs are unremarkable.  -Overall no clinical concern for recurrence. Continue breast cancer surveillance and tamoxifen  10 mg daily -She will repeat mammogram and see Dr. Curvin in 6 months, then f/up with us  in 1 year

## 2023-06-19 NOTE — Progress Notes (Signed)
 Patient Care Team: Kelli Hun, MD as PCP - General (Family Medicine) Melvenia Sor, NP as Nurse Practitioner (Gynecology) Glean Stephane BROCKS, RN as Oncology Nurse Navigator Tyree Nanetta SAILOR, RN as Oncology Nurse Navigator Lanny Callander, MD as Consulting Physician (Hematology) Curvin Deward MOULD, MD as Consulting Physician (General Surgery) Shannon Agent, MD as Consulting Physician (Radiation Oncology) Monetta Redell PARAS, MD as Consulting Physician (Cardiology) Burt Fus, DPM as Consulting Physician (Podiatry) Court Pulling, MD as Referring Physician (Dermatology) Saunders, Kelli K, NP as Nurse Practitioner (Nurse Practitioner)  Clinic Day:  06/20/2023  Referring physician: Trinidad Hun, MD  ASSESSMENT & PLAN:   Assessment & Plan: 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 S/p left lumpectomy on 02/25/21 with Dr. 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 Margins and lymph nodes negative. -her postoperative course was complicated by seroma and illness.  -s/p adjuvant radiation with Dr. Shannon 11/10-12/20/22. -she started tamoxifen  20mg  daily in mid 06/2021, reduced to 10 mg daily in 11/2021 due to arthralgia and nausea  -last mammogram 11/2021 at [facility] was negative, she would like to transfer to breast center going forward, I will order next mammogram there -Kelli Saunders is clinically doing well. Tolerating low dose tamoxifen  well. Recent breast exam 03/2022 by Dr. Curvin was benign. Labs are unremarkable.  -Overall no clinical concern for recurrence. Continue breast cancer surveillance and tamoxifen  10 mg daily -She will repeat mammogram and see Dr. Curvin in 6 months, then f/up with us  in 1 year   Plan: Labs reviewed  -CBC showing WBC 6.8; Hgb 15.5; Hct 46.6; Plt 235; Anc 4.1 -CMP - Saunders 4.2; glucose 87; BUN 17; Creatinine 0.81; eGFR >60; Ca 9.8; LFTs normal.   -reviewed results of diagnostic mammogram done  12/13/2022, which was negative. Will schedule new diagnostic imaging for 12/2023. Clinically stable exam.  Continue tamoxifen  10 mg daily Follow up with Dr. Curvin in 6 months Follow up at cancer center in 1 year and sooner if needed   The patient understands the plans discussed today and is in agreement with them.  She knows to contact our office if she develops concerns prior to her next appointment.  I provided 25 minutes of face-to-face time during this encounter and > 50% was spent counseling as documented under my assessment and plan.    Powell FORBES Lessen, NP  [facility] - A DEPT OF [facility] [address] [place] [address] Dept: [phone] Dept Fax: [phone]   No orders of the defined types were placed in this encounter.     CHIEF COMPLAINT:  CC: Left breast DCIS  Current Treatment: Tamoxifen  starting 06/2021. Now at 10 mg daily   INTERVAL HISTORY:  Kelli Saunders is here today for repeat clinical assessment.  She was last seen by Lacie, NP on 06/03/2022.  She had diagnostic mammogram 12/13/2022 which was benign.  She reports some new pain in left wrist for which she has seen orthopedics. Did get cortisone injection which seems to be helping. Wearing hand braces to bed and compression gloves during the day. She has also noted some skin tags in her left axillary region which are not painful and not changing since they appeared. She does have appointment with dermatology upcoming. She has no other concerns or complaints. She denies chest pain, chest pressure, or shortness of breath. She denies headaches or visual disturbances. She  denies abdominal pain, nausea, vomiting, or changes in bowel or bladder habits.  She denies fevers or chills. She denies pain. Her appetite is good. Her weight has increased 2 pounds over last 4 months .  I have reviewed the past medical history, past surgical history, social history and family  history with the patient and they are unchanged from previous note.  ALLERGIES:  is allergic to sulfa antibiotics.  MEDICATIONS:  Current Outpatient Medications  Medication Sig Dispense Refill   acetaminophen  (TYLENOL ) 650 MG CR tablet Take 650-1,300 mg by mouth every 8 (eight) hours as needed for pain.     Calcium Carb-Cholecalciferol (CALCIUM 600 + D PO) Take 1 tablet by mouth daily.     Coenzyme Q10 (CO Q 10 PO) Take 1 tablet by mouth daily.     loratadine (CLARITIN) 10 MG tablet Take 10 mg by mouth daily as needed for allergies.     metoprolol  succinate (TOPROL -XL) 25 MG 24 hr tablet Take 0.5 tablets (12.5 mg total) by mouth 3 (three) times a week. 25 tablet 3   Multiple Vitamin (MULTIVITAMIN) tablet Take 1 tablet by mouth daily.     Omega-3 Fatty Acids (FISH OIL PO) Take 1 tablet by mouth daily.     Probiotic Product (FORTIFY PROBIOTIC WOMENS EX ST PO) Take 1 tablet by mouth daily.     tamoxifen  (NOLVADEX ) 10 MG tablet Take 1 tablet (10 mg total) by mouth daily. 30 tablet 3   No current facility-administered medications for this visit.    HISTORY OF PRESENT ILLNESS:   Oncology History Overview Note  Cancer Staging Ductal carcinoma in situ (DCIS) of left breast Staging form: Breast, AJCC 8th Edition - Clinical: No stage assigned - Unsigned    Ductal carcinoma in situ (DCIS) of left breast  12/18/2020 Mammogram   Left Diagnostic  IMPRESSION: The new 2.2 cm cluster of linear fine pleomorphic calcifications in the left breast at 1 o'clock in the retroreolar region 1 cm from the nipple. No other significant masses, calcifications, or other findings are seen in either breast.   01/05/2021 Initial Diagnosis   Ductal carcinoma in situ (DCIS) of left breast   02/25/2021 Definitive Surgery   Left breast lumpectomy with radioactive seed localization and deep left axillary sentinel lymph node biopsy by Dr. Deward Null   02/25/2021 Pathology Results   FINAL MICROSCOPIC DIAGNOSIS:  A.  BREAST, LEFT, LUMPECTOMY:  - High-grade ductal carcinoma in situ, 1.5 cm, with a focus suspicious for microinvasive carcinoma.  See comment  - Resection margins are negative for carcinoma; anterior margin 2 mm from microinvasive carcinoma  - Biopsy site changes  - See oncology table  B. LYMPH NODE, LEFT #1, SENTINEL, EXCISION:  - Lymph node, negative for carcinoma (0/1)  C. LYMPH NODE, LEFT, SENTINEL, EXCISION:  - Lymph node, negative for carcinoma (0/1)  D. LYMPH NODE, LEFT, SENTINEL, EXCISION:  - Lymph node, negative for carcinoma (0/1)  E. LYMPH NODE, LEFT, SENTINEL, EXCISION:  - Lymph node, negative for carcinoma (0/1)  F. LYMPH NODE, LEFT, SENTINEL, EXCISION:  - Lymph node, negative for carcinoma (0/1)  G. LYMPH NODE, LEFT #2, SENTINEL, EXCISION:  - Lymph node, negative for carcinoma (0/1)  H. LYMPH NODE, LEFT #3, SENTINEL, EXCISION:  - Lymph node, negative for carcinoma (0/1)    04/13/2021 - 06/02/2021 Radiation Therapy   Per Dr. Shannon radiation treatment dates:  04/13/21 through 06/02/21 Intent: Curative Site/dose: Left breast: 40.05 Gy delivered in 15 fractions of 2.67 Gy/Fx Left breast  boost: 12.00 Gy delivered in 6 fractions of 2.00 Gy/Fx   06/12/2021 Genetic Testing   Negative genetic testing on the CancerNext-Expanded+RNAinsignt panel.  The report date is June 12, 2021.  The CancerNext-Expanded gene panel offered by [facility] and includes sequencing and rearrangement analysis for the following 77 genes: AIP, ALK, APC*, ATM*, AXIN2, BAP1, BARD1, BLM, BMPR1A, BRCA1*, BRCA2*, BRIP1*, CDC73, CDH1*, CDK4, CDKN1B, CDKN2A, CHEK2*, CTNNA1, DICER1, FANCC, FH, FLCN, GALNT12, KIF1B, LZTR1, MAX, MEN1, MET, MLH1*, MSH2*, MSH3, MSH6*, MUTYH*, NBN, NF1*, NF2, NTHL1, PALB2*, PHOX2B, PMS2*, POT1, PRKAR1A, PTCH1, PTEN*, RAD51C*, RAD51D*, RB1, RECQL, RET, SDHA, SDHAF2, SDHB, SDHC, SDHD, SMAD4, SMARCA4, SMARCB1, SMARCE1, STK11, SUFU, TMEM127, TP53*, TSC1, TSC2, VHL and XRCC2  (sequencing and deletion/duplication); EGFR, EGLN1, HOXB13, KIT, MITF, PDGFRA, POLD1, and POLE (sequencing only); EPCAM and GREM1 (deletion/duplication only). DNA and RNA analyses performed for * genes.    06/2021 -  Anti-estrogen oral therapy   Adjuvant tamoxifen  20 mg p.o. once daily   08/12/2021 Survivorship   SCP delivered by Kelli Burton, NP   12/13/2022 Mammogram   3D diagnostic mammogram, bilateral IMPRESSION: No evidence of malignancy within either breast. Stable postsurgical changes of the LEFT breast.   RECOMMENDATION: Bilateral diagnostic mammogram in 1 year.  BI-RADS CATEGORY  2: Benign.       REVIEW OF SYSTEMS:   Constitutional: Denies fevers, chills or abnormal weight loss Eyes: Denies blurriness of vision Ears, nose, mouth, throat, and face: Denies mucositis or sore throat Respiratory: Denies cough, dyspnea or wheezes Cardiovascular: Denies palpitation, chest discomfort or lower extremity swelling Gastrointestinal:  Denies nausea, heartburn or change in bowel habits Skin: Denies abnormal skin rashes Lymphatics: Denies new lymphadenopathy or easy bruising Neurological:Denies numbness, tingling or new weaknesses Behavioral/Psych: Mood is stable, no new changes  Musculoskeletal - left wrist pain, mostly near the base of the tumb and into the hand. Painful movement.  All other systems were reviewed with the patient and are negative.   VITALS:   Today's Vitals   06/20/23 1003 06/20/23 1006  BP: (!) 131/94 128/73  Pulse: 64   Resp: 18   Temp: 97.7 F (36.5 C)   TempSrc: Temporal   Weight: 144 lb 4.8 oz (65.5 kg)   Height: 5' 8 (1.727 m)   PainSc: 0-No pain    Body mass index is 21.94 kg/m.   Wt Readings from Last 3 Encounters:  06/20/23 144 lb 4.8 oz (65.5 kg)  03/09/23 142 lb 12.8 oz (64.8 kg)  08/03/22 143 lb (64.9 kg)    Body mass index is 21.94 kg/m.  Performance status (ECOG): 1 - Symptomatic but completely ambulatory  PHYSICAL EXAM:    GENERAL:alert, no distress and comfortable SKIN: skin color, texture, turgor are normal, no rashes or significant lesions EYES: normal, Conjunctiva are pink and non-injected, sclera clear OROPHARYNX:no exudate, no erythema and lips, buccal mucosa, and tongue normal  NECK: supple, thyroid normal size, non-tender, without nodularity LYMPH:  no palpable lymphadenopathy in the cervical, axillary or inguinal LUNGS: clear to auscultation and percussion with normal breathing effort HEART: regular rate & rhythm and no murmurs and no lower extremity edema ABDOMEN:abdomen soft, non-tender and normal bowel sounds Musculoskeletal:no cyanosis of digits and no clubbing. ROM of left hand is currently intact. No bony abnormalities noted at this time.  NEURO: alert & oriented x 3 with fluent speech, no focal motor/sensory deficits BREAST: right breast has no palpable lumps or masses. There is no nipple inversion or discharge. No axillary lymphadenopathy on the right. Left  breast does have minimally scarring from lumpectomy. Scars have completely healed. No palpable masses or abnormalities. No nipple inversion or discharge on the left side. There are several very small, skin colored skin lesions present in the left superior axillary region. Skin is intact without redness or drainage. There is no axillary lymphadenopathy on the left side.   LABORATORY DATA:  I have reviewed the data as listed    Component Value Date/Time   NA 137 06/20/2023 0935   NA 137 12/21/2018 0956   Saunders 4.2 06/20/2023 0935   CL 103 06/20/2023 0935   CO2 29 06/20/2023 0935   GLUCOSE 87 06/20/2023 0935   BUN 17 06/20/2023 0935   BUN 14 12/21/2018 0956   CREATININE 0.81 06/20/2023 0935   CREATININE 0.76 03/03/2016 1025   CALCIUM 9.8 06/20/2023 0935   PROT 6.8 06/20/2023 0935   PROT 6.7 12/21/2018 0956   ALBUMIN 4.4 06/20/2023 0935   ALBUMIN 4.9 (H) 12/21/2018 0956   AST 30 06/20/2023 0935   ALT 19 06/20/2023 0935   ALKPHOS 49  06/20/2023 0935   BILITOT 0.9 06/20/2023 0935   GFRNONAA >60 06/20/2023 0935   GFRAA 92 12/21/2018 0956    Lab Results  Component Value Date   WBC 6.8 06/20/2023   NEUTROABS 4.1 06/20/2023   HGB 15.5 (H) 06/20/2023   HCT 46.6 (H) 06/20/2023   MCV 90.3 06/20/2023   PLT 235 06/20/2023

## 2023-06-20 ENCOUNTER — Inpatient Hospital Stay: Payer: Medicare PPO | Attending: Nurse Practitioner | Admitting: Nurse Practitioner

## 2023-06-20 ENCOUNTER — Inpatient Hospital Stay: Payer: Medicare PPO

## 2023-06-20 ENCOUNTER — Encounter: Payer: Self-pay | Admitting: Nurse Practitioner

## 2023-06-20 VITALS — BP 128/73 | HR 64 | Temp 97.7°F | Resp 18 | Ht 68.0 in | Wt 144.3 lb

## 2023-06-20 DIAGNOSIS — Z170 Estrogen receptor positive status [ER+]: Secondary | ICD-10-CM | POA: Insufficient documentation

## 2023-06-20 DIAGNOSIS — Z79810 Long term (current) use of selective estrogen receptor modulators (SERMs): Secondary | ICD-10-CM | POA: Diagnosis not present

## 2023-06-20 DIAGNOSIS — Z923 Personal history of irradiation: Secondary | ICD-10-CM | POA: Diagnosis not present

## 2023-06-20 DIAGNOSIS — M25532 Pain in left wrist: Secondary | ICD-10-CM | POA: Insufficient documentation

## 2023-06-20 DIAGNOSIS — D0512 Intraductal carcinoma in situ of left breast: Secondary | ICD-10-CM | POA: Diagnosis present

## 2023-06-20 DIAGNOSIS — Z79899 Other long term (current) drug therapy: Secondary | ICD-10-CM | POA: Diagnosis not present

## 2023-06-20 LAB — CMP (CANCER CENTER ONLY)
ALT: 19 U/L (ref 0–44)
AST: 30 U/L (ref 15–41)
Albumin: 4.4 g/dL (ref 3.5–5.0)
Alkaline Phosphatase: 49 U/L (ref 38–126)
Anion gap: 5 (ref 5–15)
BUN: 17 mg/dL (ref 8–23)
CO2: 29 mmol/L (ref 22–32)
Calcium: 9.8 mg/dL (ref 8.9–10.3)
Chloride: 103 mmol/L (ref 98–111)
Creatinine: 0.81 mg/dL (ref 0.44–1.00)
GFR, Estimated: >60 mL/min (ref >60)
Glucose, Bld: 87 mg/dL (ref 70–99)
Potassium: 4.2 mmol/L (ref 3.5–5.1)
Sodium: 137 mmol/L (ref 135–145)
Total Bilirubin: 0.9 mg/dL (ref 0.0–1.2)
Total Protein: 6.8 g/dL (ref 6.5–8.1)

## 2023-06-20 LAB — CBC WITH DIFFERENTIAL (CANCER CENTER ONLY)
Abs Immature Granulocytes: 0.01 10*3/uL (ref 0.00–0.07)
Basophils Absolute: 0.1 10*3/uL (ref 0.0–0.1)
Basophils Relative: 1 %
Eosinophils Absolute: 0.2 10*3/uL (ref 0.0–0.5)
Eosinophils Relative: 2 %
HCT: 46.6 % — ABNORMAL HIGH (ref 36.0–46.0)
Hemoglobin: 15.5 g/dL — ABNORMAL HIGH (ref 12.0–15.0)
Immature Granulocytes: 0 %
Lymphocytes Relative: 26 %
Lymphs Abs: 1.7 10*3/uL (ref 0.7–4.0)
MCH: 30 pg (ref 26.0–34.0)
MCHC: 33.3 g/dL (ref 30.0–36.0)
MCV: 90.3 fL (ref 80.0–100.0)
Monocytes Absolute: 0.7 10*3/uL (ref 0.1–1.0)
Monocytes Relative: 10 %
Neutro Abs: 4.1 10*3/uL (ref 1.7–7.7)
Neutrophils Relative %: 61 %
Platelet Count: 235 10*3/uL (ref 150–400)
RBC: 5.16 MIL/uL — ABNORMAL HIGH (ref 3.87–5.11)
RDW: 12.9 % (ref 11.5–15.5)
WBC Count: 6.8 10*3/uL (ref 4.0–10.5)
nRBC: 0 % (ref 0.0–0.2)

## 2023-06-22 DIAGNOSIS — M5412 Radiculopathy, cervical region: Secondary | ICD-10-CM | POA: Diagnosis not present

## 2023-06-27 DIAGNOSIS — M1811 Unilateral primary osteoarthritis of first carpometacarpal joint, right hand: Secondary | ICD-10-CM | POA: Diagnosis not present

## 2023-07-16 ENCOUNTER — Other Ambulatory Visit: Payer: Self-pay | Admitting: Nurse Practitioner

## 2023-07-16 DIAGNOSIS — D0512 Intraductal carcinoma in situ of left breast: Secondary | ICD-10-CM

## 2023-08-11 ENCOUNTER — Encounter: Payer: Self-pay | Admitting: Nurse Practitioner

## 2023-08-11 ENCOUNTER — Ambulatory Visit (INDEPENDENT_AMBULATORY_CARE_PROVIDER_SITE_OTHER): Payer: Medicare PPO | Admitting: Nurse Practitioner

## 2023-08-11 VITALS — BP 128/72 | HR 66 | Ht 67.0 in | Wt 143.0 lb

## 2023-08-11 DIAGNOSIS — Z780 Asymptomatic menopausal state: Secondary | ICD-10-CM

## 2023-08-11 DIAGNOSIS — Z01419 Encounter for gynecological examination (general) (routine) without abnormal findings: Secondary | ICD-10-CM

## 2023-08-11 DIAGNOSIS — Z9189 Other specified personal risk factors, not elsewhere classified: Secondary | ICD-10-CM | POA: Diagnosis not present

## 2023-08-11 DIAGNOSIS — N951 Menopausal and female climacteric states: Secondary | ICD-10-CM

## 2023-08-11 DIAGNOSIS — M8589 Other specified disorders of bone density and structure, multiple sites: Secondary | ICD-10-CM

## 2023-08-11 DIAGNOSIS — Z853 Personal history of malignant neoplasm of breast: Secondary | ICD-10-CM

## 2023-08-11 NOTE — Progress Notes (Signed)
 Kelli Saunders 03/21/1951 [id number]   History:  73 y.o. [id number] presents for breast and pelvic exam. Having painful intercourse. Using coconut oil daily for moisture. Postmenopausal - no HRT, no bleeding. Normal pap history. 12/2020 left DCIS ER/PR positive managed with lumpectomy, radiation, and Tamoxifen.   Gynecologic History Patient's last menstrual period was 06/15/2003.   Contraception: post menopausal status Sexually active: Yes  Health Maintenance Last Pap: 05/07/2019. Results were: Normal Last mammogram: 12/13/2022. Results were: Normal Last colonoscopy: 11/04/2021. Results were: Normal, screenings no longer recommended Last Dexa: 08/14/2021. Results were: T-score -1.9, FRAX 16% / 2.6% Exercising: Yes. Yoga and walking  Smoker: no   Past medical history, past surgical history, family history and social history were all reviewed and documented in the EPIC chart. Married. Retired Nurse, learning disability. Daughter lives in [place], has 36 yo son. Son lives in [place].  ROS:  A ROS was performed and pertinent positives and negatives are included.  Exam:  Vitals:   08/11/23 1139  BP: 128/72  Pulse: 66  SpO2: 100%  Weight: 143 lb (64.9 kg)  Height: 5\' 7"  (1.702 m)     Body mass index is 22.4 kg/m.  General appearance:  Normal Thyroid:  Symmetrical, normal in size, without palpable masses or nodularity. Respiratory  Auscultation:  Clear without wheezing or rhonchi Cardiovascular  Auscultation:  Regular rate, without rubs, murmurs or gallops  Edema/varicosities:  Not grossly evident Abdominal  Soft,nontender, without masses, guarding or rebound.  Liver/spleen:  No organomegaly noted  Hernia:  None appreciated  Skin  Inspection:  Grossly normal Breasts: Examined lying and sitting.   Right: Without masses, retractions, nipple discharge or axillary adenopathy.   Left: Without masses, retractions, nipple discharge or axillary adenopathy. Pelvic: External genitalia:  no lesions               Urethra:  Urethra prolapse              Bartholins and Skenes: normal                 Vagina: normal appearing vagina with normal color and discharge, no lesions. Atrophic changes              Cervix: no lesions Bimanual Exam:  Uterus:  no masses or tenderness              Adnexa: no mass, fullness, tenderness              Rectovaginal: Deferred              Anus:  normal, no lesions  Patient informed chaperone available to be present for breast and pelvic exam. Patient has requested no chaperone to be present. Patient has been advised what will be completed during breast and pelvic exam.   Assessment/Plan:  73 y.o. [name] for breast and pelvic exam.   Encounter for breast and pelvic examination - Education provided on SBEs, importance of preventative screenings, current guidelines, high calcium diet, regular exercise, and multivitamin daily. Labs with PCP.   Postmenopausal - no HRT, no bleeding  Osteopenia of multiple sites - Plan: DG Bone Density. T-score -1.20 Oct 2021 without elevated FRAX. Continue Vitamin D, high calcium diet and regular exercise.   History of left breast cancer - 12/2020 left DCIS ER/PR positive managed with lumpectomy, radiation, and Tamoxifen (5 year plan). UTD on mammogram.   Menopausal vaginal dryness - using coconut oil daily for moisture. Having painful intercourse, sometimes has spotting after. Estrogen not recommended  due to breast cancer history. Will try hyaluronic vaginal suppositories 2-3 times weekly. Continue coconut oil.   Screening for cervical cancer - Normal Pap history. No longer screening per guidelines.   Screening for colon cancer - 2023 colonoscopy. Screening no longer recommended due to age.   Return in about 1 year (around 08/10/2024) for B&P (high risk).    Kelli Mackie DNP, 12:02 PM 08/11/2023

## 2023-09-21 DIAGNOSIS — M542 Cervicalgia: Secondary | ICD-10-CM | POA: Diagnosis not present

## 2023-09-21 DIAGNOSIS — M5412 Radiculopathy, cervical region: Secondary | ICD-10-CM | POA: Diagnosis not present

## 2023-09-21 DIAGNOSIS — M5481 Occipital neuralgia: Secondary | ICD-10-CM | POA: Diagnosis not present

## 2023-09-28 DIAGNOSIS — Z6821 Body mass index (BMI) 21.0-21.9, adult: Secondary | ICD-10-CM | POA: Diagnosis not present

## 2023-09-28 DIAGNOSIS — Z20822 Contact with and (suspected) exposure to covid-19: Secondary | ICD-10-CM | POA: Diagnosis not present

## 2023-09-28 DIAGNOSIS — U071 COVID-19: Secondary | ICD-10-CM | POA: Diagnosis not present

## 2023-09-28 DIAGNOSIS — R059 Cough, unspecified: Secondary | ICD-10-CM | POA: Diagnosis not present

## 2023-11-10 DIAGNOSIS — L565 Disseminated superficial actinic porokeratosis (DSAP): Secondary | ICD-10-CM | POA: Diagnosis not present

## 2023-11-10 DIAGNOSIS — L738 Other specified follicular disorders: Secondary | ICD-10-CM | POA: Diagnosis not present

## 2023-11-10 DIAGNOSIS — L720 Epidermal cyst: Secondary | ICD-10-CM | POA: Diagnosis not present

## 2023-11-10 DIAGNOSIS — M6740 Ganglion, unspecified site: Secondary | ICD-10-CM | POA: Diagnosis not present

## 2023-11-10 DIAGNOSIS — L821 Other seborrheic keratosis: Secondary | ICD-10-CM | POA: Diagnosis not present

## 2023-11-10 DIAGNOSIS — Z85828 Personal history of other malignant neoplasm of skin: Secondary | ICD-10-CM | POA: Diagnosis not present

## 2023-11-10 DIAGNOSIS — D225 Melanocytic nevi of trunk: Secondary | ICD-10-CM | POA: Diagnosis not present

## 2023-11-28 ENCOUNTER — Ambulatory Visit (INDEPENDENT_AMBULATORY_CARE_PROVIDER_SITE_OTHER)

## 2023-11-28 ENCOUNTER — Ambulatory Visit: Admitting: Podiatry

## 2023-11-28 DIAGNOSIS — S99921A Unspecified injury of right foot, initial encounter: Secondary | ICD-10-CM

## 2023-11-28 DIAGNOSIS — M778 Other enthesopathies, not elsewhere classified: Secondary | ICD-10-CM

## 2023-11-28 NOTE — Patient Instructions (Signed)
 Today we did a taping technique for second toe.  You can look this up if you search on YouTube second toe MPJ capsulitis taping.  More silicone or felt pads can be purchased from:  [email or web address]  Look for metatarsal pads.  He can also findings on [employer].

## 2023-11-28 NOTE — Progress Notes (Unsigned)
 Chief Complaint  Patient presents with   Toe Pain    Right 2nd toe is swollen and unable to bend. She noticed it a month ago. She has feeling in it but it is very stiff. Xrays up. Not diabetic and no anti coag.     HPI: 73 y.o. female presents today with pain and swelling to the right second toe going on for the past month or so.  She reports that is sitting somewhat elevated and is very stiff.  She denies any specific injury.  Past Medical History:  Diagnosis Date   Allergy 10/15/1958   Breast cancer (HCC) 12/30/2020   left   Family history of breast cancer    Family history of long QT syndrome 12/01/2017   Heart murmur 1981   History of radiation therapy    left breast - 04/23/2021-06/02/2021  Dr Retta Caster   Hx of transfusion of packed red blood cells 1981   after childbirth   Hyperlipidemia 12/01/2017   Left atrial enlargement 12/27/2014   Mitral valve prolapse 12/01/2017   MVP (mitral valve prolapse)    Osteopenia    Palpitation 12/26/2014   Pectus excavatum 12/01/2017   Thoracic scoliosis 12/01/2017   Wrist fracture, right 07/2013   slipped on ice    Past Surgical History:  Procedure Laterality Date   BREAST LUMPECTOMY WITH RADIOACTIVE SEED AND SENTINEL LYMPH NODE BIOPSY Left 02/25/2021   Procedure: LEFT BREAST LUMPECTOMY WITH RADIOACTIVE SEED AND SENTINEL LYMPH NODE BIOPSY;  Surgeon: Caralyn Chandler, MD;  Location: [facility];  Service: General;  Laterality: Left;   COLONOSCOPY  2013   Dr.Butler-normal   DRAINAGE AND CLOSURE OF LYMPHOCELE Left 03/19/2021   Procedure: Placement drain left axillary seroma;  Surgeon: Caralyn Chandler, MD;  Location: [facility] OR;  Service: General;  Laterality: Left;   L5 discectomy  2007   SPINE SURGERY  2007   TUBAL LIGATION  1993    Allergies  Allergen Reactions   Sulfa Antibiotics Itching    ROS denies any nausea, vomiting, fever, chills, chest pain, shortness of breath   Physical Exam: There were no vitals  filed for this visit.  General: The patient is alert and oriented x3 in no acute distress.  Dermatology: Skin is warm, dry and supple bilateral lower extremities. Interspaces are clear of maceration and debris.    Vascular: Palpable pedal pulses bilaterally. Capillary refill within normal limits.  No diffuse appreciable edema.  No erythema or calor.  Neurological: Light touch sensation grossly intact bilateral feet.   Musculoskeletal Exam: Localized edema right second toe.  The second toe is stiff without significant contracture.  There is some tenderness on palpation of the second MPJ.  No obvious hypermobility noted in second PIPJ.  Is sitting somewhat elevated.  Radiographic Exam: Right foot 3 views weightbearing 11/28/2023 Normal osseous mineralization. Joint spaces preserved.  No fractures or acute osseous irregularities noted.  Mild bunion deformity present.  Mildly elongated second metatarsal noted.  Assessment/Plan of Care: 1. Plantar plate injury, right, initial encounter   2. Capsulitis of right foot      No orders of the defined types were placed in this encounter.  None  Discussed clinical findings with patient today.  Radiographs reviewed with patient.  Suspect second MPJ capsulitis, possible plantar plate injury.  Extensor second PJ capsulitis taping was performed today.  Demonstrated technique with patient.  She can reapply this as needed however advised her to continue doing  this over the next couple weeks until follow-up.  Metatarsal pad dispensed and applied as well.  Recommend 2 weeks of over-the-counter naproxen 500 mg twice daily as well.  Discussed RICE therapy.  Reevaluate 2 weeks.   Hermes Wafer L. Lunda Salines, AACFAS [facility]     [address] [place], [place] [address]                Office [phone]  Fax [phone]

## 2023-12-09 ENCOUNTER — Ambulatory Visit (HOSPITAL_BASED_OUTPATIENT_CLINIC_OR_DEPARTMENT_OTHER)
Admission: RE | Admit: 2023-12-09 | Discharge: 2023-12-09 | Disposition: A | Discharge status: Home / Self Care | Source: Ambulatory Visit | Attending: Nurse Practitioner | Admitting: Radiology

## 2023-12-09 DIAGNOSIS — M8589 Other specified disorders of bone density and structure, multiple sites: Secondary | ICD-10-CM

## 2023-12-09 DIAGNOSIS — Z780 Asymptomatic menopausal state: Secondary | ICD-10-CM | POA: Diagnosis not present

## 2023-12-09 DIAGNOSIS — Z13820 Encounter for screening for osteoporosis: Secondary | ICD-10-CM

## 2023-12-12 ENCOUNTER — Ambulatory Visit: Admitting: Podiatry

## 2023-12-13 ENCOUNTER — Encounter: Payer: Self-pay | Admitting: Podiatry

## 2023-12-13 ENCOUNTER — Ambulatory Visit: Admitting: Podiatry

## 2023-12-13 DIAGNOSIS — S99921A Unspecified injury of right foot, initial encounter: Secondary | ICD-10-CM

## 2023-12-13 DIAGNOSIS — M778 Other enthesopathies, not elsewhere classified: Secondary | ICD-10-CM

## 2023-12-13 NOTE — Progress Notes (Signed)
 Chief Complaint  Patient presents with   Right 2nd MPJ    Right 2nd mpj not really any better. Her toe still will not bend on its on, but she can manually manipulate it. She is using the met pads, and taping but feels it is not help. Meloxicam  is tearing her stomach up.     HPI: 73 y.o. female presents today following up for right second toe problem.  She has been taping the toe as instructed.  She has not noticed significant change though the swelling has gone down some.  The toe is not overly painful but is very stiff and she has difficulty moving it.  It does sit with some elevation.  She has been using the metatarsal pads.  She has not been able to tolerate the meloxicam .  Past Medical History:  Diagnosis Date   Allergy 10/15/1958   Breast cancer (HCC) 12/30/2020   left   Family history of breast cancer    Family history of long QT syndrome 12/01/2017   Heart murmur 1981   History of radiation therapy    left breast - 04/23/2021-06/02/2021  Dr Lynwood Nasuti   Hx of transfusion of packed red blood cells 1981   after childbirth   Hyperlipidemia 12/01/2017   Left atrial enlargement 12/27/2014   Mitral valve prolapse 12/01/2017   MVP (mitral valve prolapse)    Osteopenia    Palpitation 12/26/2014   Pectus excavatum 12/01/2017   Personal history of radiation therapy    Thoracic scoliosis 12/01/2017   Wrist fracture, right 07/2013   slipped on ice    Past Surgical History:  Procedure Laterality Date   BREAST LUMPECTOMY WITH RADIOACTIVE SEED AND SENTINEL LYMPH NODE BIOPSY Left 02/25/2021   Procedure: LEFT BREAST LUMPECTOMY WITH RADIOACTIVE SEED AND SENTINEL LYMPH NODE BIOPSY;  Surgeon: Curvin Deward MOULD, MD;  Location: [facility];  Service: General;  Laterality: Left;   COLONOSCOPY  2013   Dr.Butler-normal   DRAINAGE AND CLOSURE OF LYMPHOCELE Left 03/19/2021   Procedure: Placement drain left axillary seroma;  Surgeon: Curvin Deward MOULD, MD;  Location: [facility] OR;   Service: General;  Laterality: Left;   L5 discectomy  2007   SPINE SURGERY  2007   TUBAL LIGATION  1993    Allergies  Allergen Reactions   Sulfa Antibiotics Itching    ROS denies any nausea, vomiting, fever, chills, chest pain, shortness of breath   Physical Exam: There were no vitals filed for this visit.  General: The patient is alert and oriented x3 in no acute distress.  Dermatology: Skin is warm, dry and supple bilateral lower extremities. Interspaces are clear of maceration and debris.    Vascular: Palpable pedal pulses bilaterally. Capillary refill within normal limits.  No diffuse appreciable edema.  No erythema or calor.  Neurological: Light touch sensation grossly intact bilateral feet.   Musculoskeletal Exam: Decreased edema to right second toe.  The second toe is stiff without significant contracture.  Minimal tenderness on palpation of second and MPJ today.  The second toe does sit somewhat elevated and she has difficulty with passive or active range of motion to the second at the MPJ and PIPJ level.  Assessment/Plan of Care: 1. Plantar plate injury, right, initial encounter   2. Capsulitis of right foot      No orders of the defined types were placed in this encounter.  None  Discussed clinical findings with patient today.  She has had mild  relief from the continued taping and padding.  Overall unsure of exact etiology of the toe stiffness but do suspect plantar plate injury.  Not very painful for her right now.  We discussed plan going forward.  Given no pain, she can monitor for signs of progression and do some over-the-counter splinting options or continue to tape.  If she notices progressive deformity or pain, follow-up as needed and can consider advanced imaging and repeat radiographs.   Makhari Dovidio L. Lamount MAUL, AACFAS [facility]     [address] [place], [place] [address]                Office [phone]  Fax [phone]

## 2023-12-14 ENCOUNTER — Ambulatory Visit: Payer: Self-pay | Admitting: Nurse Practitioner

## 2023-12-14 ENCOUNTER — Ambulatory Visit
Admission: RE | Admit: 2023-12-14 | Discharge: 2023-12-14 | Disposition: A | Discharge status: Home / Self Care | Source: Ambulatory Visit | Attending: Nurse Practitioner

## 2023-12-14 DIAGNOSIS — D0512 Intraductal carcinoma in situ of left breast: Secondary | ICD-10-CM

## 2023-12-14 DIAGNOSIS — Z08 Encounter for follow-up examination after completed treatment for malignant neoplasm: Secondary | ICD-10-CM | POA: Diagnosis not present

## 2023-12-14 DIAGNOSIS — Z853 Personal history of malignant neoplasm of breast: Secondary | ICD-10-CM | POA: Diagnosis not present

## 2023-12-14 HISTORY — DX: Personal history of irradiation: Z92.3

## 2023-12-18 DIAGNOSIS — N3091 Cystitis, unspecified with hematuria: Secondary | ICD-10-CM | POA: Diagnosis not present

## 2023-12-18 DIAGNOSIS — N3000 Acute cystitis without hematuria: Secondary | ICD-10-CM | POA: Diagnosis not present

## 2023-12-22 ENCOUNTER — Other Ambulatory Visit: Payer: Self-pay | Admitting: Nurse Practitioner

## 2023-12-22 DIAGNOSIS — D0512 Intraductal carcinoma in situ of left breast: Secondary | ICD-10-CM

## 2024-01-18 DIAGNOSIS — H5213 Myopia, bilateral: Secondary | ICD-10-CM | POA: Diagnosis not present

## 2024-01-18 DIAGNOSIS — H04129 Dry eye syndrome of unspecified lacrimal gland: Secondary | ICD-10-CM | POA: Diagnosis not present

## 2024-01-18 DIAGNOSIS — H25813 Combined forms of age-related cataract, bilateral: Secondary | ICD-10-CM | POA: Diagnosis not present

## 2024-02-02 DIAGNOSIS — L570 Actinic keratosis: Secondary | ICD-10-CM | POA: Diagnosis not present

## 2024-03-07 ENCOUNTER — Telehealth: Payer: Self-pay | Admitting: *Deleted

## 2024-03-07 DIAGNOSIS — E785 Hyperlipidemia, unspecified: Secondary | ICD-10-CM | POA: Diagnosis not present

## 2024-03-07 NOTE — Telephone Encounter (Signed)
 Pt walked in for pre appt labs

## 2024-03-08 LAB — LIPID PANEL
Chol/HDL Ratio: 2.5 ratio (ref 0.0–4.4)
Cholesterol, Total: 168 mg/dL (ref 100–199)
HDL: 66 mg/dL (ref >39)
LDL Chol Calc (NIH): 86 mg/dL (ref 0–99)
Triglycerides: 87 mg/dL (ref 0–149)
VLDL Cholesterol Cal: 16 mg/dL (ref 5–40)

## 2024-03-14 ENCOUNTER — Ambulatory Visit: Attending: Cardiology | Admitting: Cardiology

## 2024-03-14 ENCOUNTER — Encounter: Payer: Self-pay | Admitting: Cardiology

## 2024-03-14 VITALS — BP 118/72 | HR 72 | Ht 68.0 in | Wt 143.4 lb

## 2024-03-14 DIAGNOSIS — I4719 Other supraventricular tachycardia: Secondary | ICD-10-CM

## 2024-03-14 DIAGNOSIS — E785 Hyperlipidemia, unspecified: Secondary | ICD-10-CM

## 2024-03-14 DIAGNOSIS — I341 Nonrheumatic mitral (valve) prolapse: Secondary | ICD-10-CM

## 2024-03-14 MED ORDER — METOPROLOL SUCCINATE ER 25 MG PO TB24: 12.5000 mg | ORAL_TABLET | ORAL | 3 refills | Status: AC

## 2024-03-14 NOTE — Progress Notes (Signed)
 Cardiology Office Note:    Date:  03/14/2024   ID:  Kelli Saunders, DOB July 01, 1950, MRN [id number]  PCP:  Trinidad Hun, MD  Cardiologist:  Redell Leiter, MD    Referring MD: Trinidad Hun, MD    ASSESSMENT:    1. Mitral valve prolapse   2. PAT (paroxysmal atrial tachycardia)   3. Hyperlipidemia, unspecified hyperlipidemia type    PLAN:    In order of problems listed above:  Stable having very low symptomatology no murmur on exam and I do not think we need to recheck an echocardiogram at this time Good response continue low-dose beta-blocker Continue her over-the-counter fish oil supplement   Next appointment: 1year   Medication Adjustments/Labs and Tests Ordered: Current medicines are reviewed at length with the patient today.  Concerns regarding medicines are outlined above.  No orders of the defined types were placed in this encounter.  No orders of the defined types were placed in this encounter.    History of Present Illness:    Kelli Saunders is a 73 y.o. female with a hx of mitral valve prolapse family history of long QT interval previous syncope and a coronary calcium score of 0 last seen 03/09/2023.  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  The report date is June 12, 2021.     The CancerNext-Expanded gene panel offered by [employer] and includes sequencing and rearrangement analysis for the following 77 genes: AIP, ALK, APC*, ATM*, AXIN2, BAP1, BARD1, BLM, BMPR1A, BRCA1*, BRCA2*, BRIP1*, CDC73, CDH1*, CDK4, CDKN1B, CDKN2A, CHEK2*, CTNNA1, DICER1, FANCC, F   Heart murmur 1981   History of radiation therapy    left breast - 04/23/2021-06/02/2021  Dr Lynwood Nasuti   Hx of transfusion of packed red blood cells 1981   after childbirth   Hyperlipidemia 12/01/2017   Left atrial enlargement 12/27/2014   Mitral valve prolapse 12/01/2017   MVP (mitral valve prolapse)    Osteopenia    Palpitation 12/26/2014   Pectus excavatum 12/01/2017   Personal history of radiation therapy    Thoracic scoliosis 12/01/2017   Wrist fracture, right 07/2013   slipped on ice    Current Medications: No outpatient medications have been marked as taking for the 03/14/24 encounter (Appointment) with Leiter Redell PARAS, MD.      EKGs/Labs/Other Studies Reviewed:    The following studies were reviewed today:  Cardiac Studies & Procedures   ______________________________________________________________________________________________        SHERRILEE  LONG TERM MONITOR-LIVE TELEMETRY (3-14 DAYS) 12/10/2020  Narrative Patch Wear Time:  13 days and 19 hours (2022-06-07T11:15:24-0400 to 2022-06-21T06:50:08-398)  Patient had a min HR of 42 bpm, max HR of 185 bpm, and avg HR of 70 bpm. Predominant  underlying rhythm was Sinus Rhythm. 44 Supraventricular Tachycardia runs occurred, the run with the fastest interval lasting 12 beats with a max rate of 185 bpm, the longest lasting 15 beats with an avg rate of 106 bpm. Some episodes of Supraventricular Tachycardia may be possible Atrial Tachycardia with variable block. Supraventricular Tachycardia was detected within +/- 45 seconds of  symptomatic patient event(s). Isolated SVEs were rare (<1.0%), SVE Couplets were rare (<1.0%), and SVE Triplets were rare (<1.0%). Isolated VEs were rare (<1.0%, 917), VE Couplets were rare (<1.0%, 1), and VE Triplets were rare (<1.0%, 1).  There were 5 triggered 3 diary events for which were associated with APCs and brief runs of atrial tachycardia Ventricular ectopy was rare. Supraventricular ectopy overall was rare however there were 44 brief runs of APCs or atrial tachycardia the longest 15 complexes rate of 106 bpm.  Supraventricular ectopy was symptomatic. There were no pauses of 3 seconds or greater or episodes of second or third-degree AV nodal block or sinus node exit block.  Conclusion, although supraventricular ectopy was rare there were frequent episodes of brief runs of APCs or atrial tachycardia and the symptomatic events were associated with atrial arrhythmia.   CT SCANS  CT CARDIAC SCORING (SELF PAY ONLY) 01/22/2020  Addendum 01/23/2020  8:26 AM ADDENDUM REPORT: 01/23/2020 08:24  CLINICAL DATA:  Risk stratification - 73 year old with family history of CAD  EXAM: Coronary Calcium Score  TECHNIQUE: The patient was scanned on a [employer] scanner. Axial non-contrast 3 mm slices were carried out through the heart. The data set was analyzed on a dedicated work station and scored using the Agatson method.  FINDINGS: Non-cardiac: See separate report from [facility].  Pectus excavatum  Ascending Aorta: Normal, no atherosclerosis  Pericardium: Normal  Coronary arteries: Normal origins, no calcifications  IMPRESSION: 1. Coronary calcium score of 0. This was 0 percentile for age and sex matched control. Low risk scan.  Oneil Parchment, MD [facility]   Electronically Signed By: Oneil Parchment MD On: 01/23/2020 08:24  Narrative EXAM: OVER-READ INTERPRETATION  CT CHEST  The following report is an over-read performed by radiologist Dr. Rockey Kilts of [facility] on 01/22/2020. This over-read does not include interpretation of cardiac or coronary anatomy or pathology. The calcium score interpretation by the cardiologist is attached.  COMPARISON:  None.  FINDINGS: Vascular: Normal aortic caliber.  Mediastinum/Nodes: No imaged thoracic adenopathy.  Lungs/Pleura: No pleural fluid.  Mild volume loss in the lingula.  Upper Abdomen: Normal imaged portions of the liver, spleen, stomach.  Musculoskeletal: Moderate to marked pectus excavatum deformity.  IMPRESSION: No acute findings in the imaged extracardiac chest.  Electronically Signed: By: Rockey Kilts M.D. On: 01/22/2020 10:18     ______________________________________________________________________________________________         EKG Interpretation Date/Time:  Wednesday March 14 2024 14:10:38 EDT Ventricular Rate:  72 PR Interval:  174 QRS Duration:  82 QT Interval:  394 QTC Calculation: 431 R Axis:   107  Text Interpretation: Normal sinus rhythm Rightward axis Low voltage QRS RSR' pattern in V1 When compared with ECG of 09-Mar-2023 14:30, Premature ventricular complexes are no longer Present Confirmed by Monetta Rogue ([id number]) on 03/14/2024 2:15:57 PM   Recent Labs: 06/20/2023: ALT 19; BUN 17; Creatinine 0.81; Hemoglobin 15.5; Platelet Count 235; Potassium 4.2; Sodium 137  Recent Lipid Panel    Component Value Date/Time   CHOL 168 03/07/2024 0853   TRIG 87 03/07/2024 0853   HDL 66 03/07/2024 0853  CHOLHDL 2.5 03/07/2024 0853   CHOLHDL 2.4 03/03/2016 1025   VLDL 11 03/03/2016 1025   LDLCALC 86 03/07/2024 0853    Physical Exam:    VS:  LMP 06/15/2003     Wt Readings from Last 3 Encounters:  08/11/23 143 lb (64.9 kg)  06/20/23 144 lb 4.8 oz (65.5 kg)  03/09/23 142 lb 12.8 oz (64.8 kg)     GEN:  Well nourished, well developed in no acute distress HEENT: Normal NECK: No JVD; No carotid bruits LYMPHATICS: No lymphadenopathy CARDIAC: RRR, no murmurs,  rubs, gallops RESPIRATORY:  Clear to auscultation without rales, wheezing or rhonchi  ABDOMEN: Soft, non-tender, non-distended MUSCULOSKELETAL:  No edema; No deformity  SKIN: Warm and dry NEUROLOGIC:  Alert and oriented x 3 PSYCHIATRIC:  Normal affect    Signed, Redell Leiter, MD  03/14/2024 9:41 AM     [facility]

## 2024-03-14 NOTE — Patient Instructions (Signed)
 Medication Instructions:  Your physician has recommended you make the following change in your medication:   START: Metoprolol  succinate 12.5 mg every other day  *If you need a refill on your cardiac medications before your next appointment, please call your pharmacy*  Lab Work: None If you have labs (blood work) drawn today and your tests are completely normal, you will receive your results only by: MyChart Message (if you have MyChart) OR A paper copy in the mail If you have any lab test that is abnormal or we need to change your treatment, we will call you to review the results.  Testing/Procedures: None  Follow-Up: At [facility], you and your health needs are our priority.  As part of our continuing mission to provide you with exceptional heart care, our providers are all part of one team.  This team includes your primary Cardiologist (physician) and Advanced Practice Providers or APPs (Physician Assistants and Nurse Practitioners) who all work together to provide you with the care you need, when you need it.  Your next appointment:   1 year(s)  Provider:   Redell Leiter, MD    We recommend signing up for the patient portal called MyChart.  Sign up information is provided on this After Visit Summary.  MyChart is used to connect with patients for Virtual Visits (Telemedicine).  Patients are able to view lab/test results, encounter notes, upcoming appointments, etc.  Non-urgent messages can be sent to your provider as well.   To learn more about what you can do with MyChart, go to [email or web address].   Other Instructions None

## 2024-04-05 ENCOUNTER — Other Ambulatory Visit: Payer: Self-pay | Admitting: Nurse Practitioner

## 2024-04-05 DIAGNOSIS — D0512 Intraductal carcinoma in situ of left breast: Secondary | ICD-10-CM

## 2024-04-09 DIAGNOSIS — Z1389 Encounter for screening for other disorder: Secondary | ICD-10-CM | POA: Diagnosis not present

## 2024-04-09 DIAGNOSIS — Z0184 Encounter for antibody response examination: Secondary | ICD-10-CM | POA: Diagnosis not present

## 2024-04-09 DIAGNOSIS — Z139 Encounter for screening, unspecified: Secondary | ICD-10-CM | POA: Diagnosis not present

## 2024-04-09 DIAGNOSIS — Z1331 Encounter for screening for depression: Secondary | ICD-10-CM | POA: Diagnosis not present

## 2024-04-09 DIAGNOSIS — Z136 Encounter for screening for cardiovascular disorders: Secondary | ICD-10-CM | POA: Diagnosis not present

## 2024-04-09 DIAGNOSIS — Z0000 Encounter for general adult medical examination without abnormal findings: Secondary | ICD-10-CM | POA: Diagnosis not present

## 2024-04-09 DIAGNOSIS — Z1339 Encounter for screening examination for other mental health and behavioral disorders: Secondary | ICD-10-CM | POA: Diagnosis not present

## 2024-04-10 ENCOUNTER — Other Ambulatory Visit: Payer: Medicare PPO

## 2024-06-19 ENCOUNTER — Ambulatory Visit: Payer: Medicare PPO | Admitting: Hematology

## 2024-06-19 ENCOUNTER — Other Ambulatory Visit: Payer: Medicare PPO

## 2024-06-25 ENCOUNTER — Inpatient Hospital Stay: Admitting: Hematology

## 2024-06-25 ENCOUNTER — Inpatient Hospital Stay

## 2024-07-03 ENCOUNTER — Other Ambulatory Visit: Payer: Self-pay | Admitting: Nurse Practitioner

## 2024-07-03 DIAGNOSIS — D0512 Intraductal carcinoma in situ of left breast: Secondary | ICD-10-CM

## 2024-07-03 NOTE — Progress Notes (Unsigned)
 " Patient Care Team: Trinidad Hun, MD as PCP - General (Family Medicine) Melvenia Sor, NP as Nurse Practitioner (Gynecology) Tyree Nanetta SAILOR, RN as Oncology Nurse Navigator Lanny Callander, MD as Consulting Physician (Hematology) Curvin Deward MOULD, MD as Consulting Physician (General Surgery) Shannon Agent, MD as Consulting Physician (Radiation Oncology) Monetta Redell PARAS, MD as Consulting Physician (Cardiology) Burt Fus, DPM as Consulting Physician (Podiatry) Court Pulling, MD as Referring Physician (Dermatology) Burton, Lacie K, NP as Nurse Practitioner (Nurse Practitioner)  Clinic Day:  07/04/2024  Referring physician: Trinidad Hun, MD  ASSESSMENT & PLAN:   Assessment & Plan: 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 S/p left lumpectomy on 02/25/21 with Dr. 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 Margins and lymph nodes negative. -her postoperative course was complicated by seroma and illness.  -s/p adjuvant radiation with Dr. Shannon 11/10-12/20/22. -she started tamoxifen  20mg  daily in mid 06/2021, reduced to 10 mg daily in 11/2021 due to arthralgia and nausea  -last mammogram 11/2021 at [facility] was negative, she would like to transfer to breast center going forward, I will order next mammogram there -[name] is clinically doing well. Tolerating low dose tamoxifen  well.  -3D diagnostic mammogram done 12/14/2023.  She has breast density category B.  Overall results are benign. -Overall no clinical concern for recurrence. Continue breast cancer surveillance and tamoxifen  10 mg daily -diagnostic mammogram ordered for 12/2024.  -plan for labs and follow up in 1 year.      Bone health Patient had DEXA scan on 12/09/2023.  Findings were consistent with osteopenia/low bone mass.  FRAX 10-year probability is 18.5% for major osteoporotic fracture and 4.1% for hip fracture.  She currently takes  calcium and vitamin D  daily.  She is physically active.  UTI Seen in urgent care over the weekend. Treated for UTI. Currently on Day 4 macrobid. Symptoms are resolving. She has 1 day left of antibiotics.   Headaches Patient states that she is experiencing migraine like headaches. Occurring once every 2 weeks or so. Wakes up with them in the middle of the night. Unable to determine a cause at this time. Tylenol  and aleve have not been effective. Discussed trial of Excedrin Migraine. This medication does contain caffeine and may disrupt sleep. Advised she discuss with primary care and inquire about migraine relief for as needed basis. Recommended a headache journal to see if trigger can be determined.   DCIS left  Post lumpectomy and radiation. Currently on tamoxifen  10 mg daily. No negative side effects reported. Due for mammogram in 12/2023. This was ordered as part of today's visit.   Plan Labs reviewed.  -unremarkable CBC and CMP.  3D diagnostic mammogram ordered for 12/2024.  Continue tamoxifen  10 mg daily.  Labs and follow up in 1 year, sooner if needed.   The patient understands the plans discussed today and is in agreement with them.  She knows to contact our office if she develops concerns prior to her next appointment.  I provided 25 minutes of face-to-face time during this encounter and > 50% was spent counseling as documented under my assessment and plan.    Powell FORBES Lessen, NP  [facility] - A DEPT OF [facility] [address] [place] [address] Dept: [phone] Dept Fax: [phone]   Orders Placed This Encounter  Procedures   MM DIAG BREAST TOMO  BILATERAL    Standing Status:   Future    Expected Date:   12/14/2024    Expiration Date:   07/04/2025    Reason for Exam (SYMPTOM  OR DIAGNOSIS REQUIRED):   history of DCIS left - on tamoxifen . screening for breast cancer    [facility]      CHIEF COMPLAINT:  CC: DCIS of left breast  Current Treatment: Tamoxifen  10 mg daily (started 06/2021)  INTERVAL HISTORY:  Kelli Saunders is here today for repeat clinical assessment.  She last saw me on 06/22/2023.  She had 3D diagnostic mammogram done 12/14/2023.  She has breast density category B.  Overall results are benign.  She continue stamoxifen without negative side effects. Has noted no changes, lumps, or masses in either breast. She denies chest pain, chest pressure, or shortness of breath. She has been having headaches. Occurring once every 2 weeks. Tylenol  and/or aleve have not been effective. Unclear if there is trigger. She denies abdominal pain, nausea, vomiting, or changes in bowel  habits. Currently being treated for UTI.  She denies fevers or chills. She denies pain. Her appetite is good. Her weight has been stable.  I have reviewed the past medical history, past surgical history, social history and family history with the patient and they are unchanged from previous note.  ALLERGIES:  is allergic to sulfa antibiotics.  MEDICATIONS:  Current Outpatient Medications  Medication Sig Dispense Refill   acetaminophen  (TYLENOL ) 650 MG CR tablet Take 650-1,300 mg by mouth every 8 (eight) hours as needed for pain.     Calcium Carb-Cholecalciferol (CALCIUM 600 + D PO) Take 1 tablet by mouth daily.     Coenzyme Q10 (CO Q 10 PO) Take 1 tablet by mouth daily.     FLUAD 0.5 ML injection Inject 0.5 mLs into the muscle once.     loratadine (CLARITIN) 10 MG tablet Take 10 mg by mouth daily as needed for allergies.     metoprolol  succinate (TOPROL  XL) 25 MG 24 hr tablet Take 0.5 tablets (12.5 mg total) by mouth every other day. 90 tablet 3   Multiple Vitamin (MULTIVITAMIN) tablet Take 1 tablet by mouth daily.     naproxen sodium (ALEVE) 220 MG tablet Take 220 mg by mouth daily as needed.     nitrofurantoin, macrocrystal-monohydrate, (MACROBID) 100 MG capsule Take 100 mg by mouth 2  (two) times daily.     Omega-3 Fatty Acids (FISH OIL PO) Take 1 tablet by mouth daily.     Probiotic Product (FORTIFY PROBIOTIC WOMENS EX ST PO) Take 1 tablet by mouth daily.     tamoxifen  (NOLVADEX ) 10 MG tablet TAKE 1 TABLET(10 MG) BY MOUTH DAILY 30 tablet 3   No current facility-administered medications for this visit.    HISTORY OF PRESENT ILLNESS:   Oncology History Overview Note  Cancer Staging Ductal carcinoma in situ (DCIS) of left breast Staging form: Breast, AJCC 8th Edition - Clinical: No stage assigned - Unsigned    Ductal carcinoma in situ (DCIS) of left breast  12/18/2020 Mammogram   Left Diagnostic  IMPRESSION: The new 2.2 cm cluster of linear fine pleomorphic calcifications in the left breast at 1 o'clock in the retroreolar region 1 cm from the nipple. No other significant masses, calcifications, or other findings are seen in either breast.   01/05/2021 Initial Diagnosis   Ductal carcinoma in situ (DCIS) of left breast   02/25/2021 Definitive Surgery   Left breast lumpectomy with  radioactive seed localization and deep left axillary sentinel lymph node biopsy by Dr. Deward Null   02/25/2021 Pathology Results   FINAL MICROSCOPIC DIAGNOSIS:  A. BREAST, LEFT, LUMPECTOMY:  - High-grade ductal carcinoma in situ, 1.5 cm, with a focus suspicious for microinvasive carcinoma.  See comment  - Resection margins are negative for carcinoma; anterior margin 2 mm from microinvasive carcinoma  - Biopsy site changes  - See oncology table  B. LYMPH NODE, LEFT #1, SENTINEL, EXCISION:  - Lymph node, negative for carcinoma (0/1)  C. LYMPH NODE, LEFT, SENTINEL, EXCISION:  - Lymph node, negative for carcinoma (0/1)  D. LYMPH NODE, LEFT, SENTINEL, EXCISION:  - Lymph node, negative for carcinoma (0/1)  E. LYMPH NODE, LEFT, SENTINEL, EXCISION:  - Lymph node, negative for carcinoma (0/1)  F. LYMPH NODE, LEFT, SENTINEL, EXCISION:  - Lymph node, negative for carcinoma (0/1)  G. LYMPH NODE,  LEFT #2, SENTINEL, EXCISION:  - Lymph node, negative for carcinoma (0/1)  H. LYMPH NODE, LEFT #3, SENTINEL, EXCISION:  - Lymph node, negative for carcinoma (0/1)    04/13/2021 - 06/02/2021 Radiation Therapy   Per Dr. Shannon radiation treatment dates:  04/13/21 through 06/02/21 Intent: Curative Site/dose: Left breast: 40.05 Gy delivered in 15 fractions of 2.67 Gy/Fx Left breast boost: 12.00 Gy delivered in 6 fractions of 2.00 Gy/Fx   06/12/2021 Genetic Testing   Negative genetic testing on the CancerNext-Expanded+RNAinsignt panel.  The report date is June 12, 2021.  The CancerNext-Expanded gene panel offered by [facility] and includes sequencing and rearrangement analysis for the following 77 genes: AIP, ALK, APC*, ATM*, AXIN2, BAP1, BARD1, BLM, BMPR1A, BRCA1*, BRCA2*, BRIP1*, CDC73, CDH1*, CDK4, CDKN1B, CDKN2A, CHEK2*, CTNNA1, DICER1, FANCC, FH, FLCN, GALNT12, KIF1B, LZTR1, MAX, MEN1, MET, MLH1*, MSH2*, MSH3, MSH6*, MUTYH*, NBN, NF1*, NF2, NTHL1, PALB2*, PHOX2B, PMS2*, POT1, PRKAR1A, PTCH1, PTEN*, RAD51C*, RAD51D*, RB1, RECQL, RET, SDHA, SDHAF2, SDHB, SDHC, SDHD, SMAD4, SMARCA4, SMARCB1, SMARCE1, STK11, SUFU, TMEM127, TP53*, TSC1, TSC2, VHL and XRCC2 (sequencing and deletion/duplication); EGFR, EGLN1, HOXB13, KIT, MITF, PDGFRA, POLD1, and POLE (sequencing only); EPCAM and GREM1 (deletion/duplication only). DNA and RNA analyses performed for * genes.    06/2021 -  Anti-estrogen oral therapy   Adjuvant tamoxifen  20 mg p.o. once daily   08/12/2021 Survivorship   SCP delivered by Lacie Burton, NP   12/13/2022 Mammogram   3D diagnostic mammogram, bilateral IMPRESSION: No evidence of malignancy within either breast. Stable postsurgical changes of the LEFT breast.   RECOMMENDATION: Bilateral diagnostic mammogram in 1 year.  BI-RADS CATEGORY  2: Benign.       REVIEW OF SYSTEMS:   Constitutional: Denies fevers, chills or abnormal weight loss Eyes: Denies blurriness of vision Ears,  nose, mouth, throat, and face: Denies mucositis or sore throat Respiratory: Denies cough, dyspnea or wheezes Cardiovascular: Denies palpitation, chest discomfort or lower extremity swelling Gastrointestinal:  Denies nausea, heartburn or change in bowel habits Skin: Denies abnormal skin rashes Lymphatics: Denies new lymphadenopathy or easy bruising Neurological:Denies numbness, tingling or new weaknesses. Periodic headaches. Feel migraine like. Wake her up at night. Unable to determine a cause at ths time.  Behavioral/Psych: Mood is stable, no new changes  All other systems were reviewed with the patient and are negative.   VITALS:   Today's Vitals   07/04/24 0900 07/04/24 0949  BP:  127/77  Pulse:  69  Resp:  16  Temp:  98 F (36.7 C)  TempSrc:  Temporal  SpO2:  99%  Weight:  142 lb 14.4 oz (  64.8 kg)  PainSc: 0-No pain    Body mass index is 21.73 kg/m.   Wt Readings from Last 3 Encounters:  07/04/24 142 lb 14.4 oz (64.8 kg)  03/14/24 143 lb 6.4 oz (65 kg)  08/11/23 143 lb (64.9 kg)    Body mass index is 21.73 kg/m.  Performance status (ECOG): 0 - Asymptomatic  PHYSICAL EXAM:   GENERAL:alert, no distress and comfortable SKIN: skin color, texture, turgor are normal, no rashes or significant lesions EYES: normal, Conjunctiva are pink and non-injected, sclera clear OROPHARYNX:no exudate, no erythema and lips, buccal mucosa, and tongue normal  NECK: supple, thyroid normal size, non-tender, without nodularity LYMPH:  no palpable lymphadenopathy in the cervical, axillary or inguinal LUNGS: clear to auscultation and percussion with normal breathing effort HEART: regular rate & rhythm and no murmurs and no lower extremity edema ABDOMEN:abdomen soft, non-tender and normal bowel sounds Musculoskeletal:no cyanosis of digits and no clubbing  NEURO: alert & oriented x 3 with fluent speech, no focal motor/sensory deficits BREAST: well healed lumpectomy scar on the left breast.  Lumpectomy changes noted. No new masses or lumps are palpable today. There is no nipple inversion or nipple discharge. There is no axillary lymphadenopathy on the left. There are no palpable lumps or masses in the right breast. There is no nipple inversion or nipple discharge. There is no axillary lymphadenopathy on the right.   LABORATORY DATA:  I have reviewed the data as listed    Component Value Date/Time   NA 136 07/04/2024 0901   NA 137 12/21/2018 0956   K 4.7 07/04/2024 0901   CL 102 07/04/2024 0901   CO2 26 07/04/2024 0901   GLUCOSE 93 07/04/2024 0901   BUN 13 07/04/2024 0901   BUN 14 12/21/2018 0956   CREATININE 0.77 07/04/2024 0901   CREATININE 0.76 03/03/2016 1025   CALCIUM 9.3 07/04/2024 0901   PROT 6.4 (L) 07/04/2024 0901   PROT 6.7 12/21/2018 0956   ALBUMIN 4.1 07/04/2024 0901   ALBUMIN 4.9 (H) 12/21/2018 0956   AST 36 07/04/2024 0901   ALT 26 07/04/2024 0901   ALKPHOS 52 07/04/2024 0901   BILITOT 0.5 07/04/2024 0901   GFRNONAA >60 07/04/2024 0901   GFRAA 92 12/21/2018 0956    Lab Results  Component Value Date   WBC 6.1 07/04/2024   NEUTROABS 3.9 07/04/2024   HGB 14.7 07/04/2024   HCT 42.8 07/04/2024   MCV 89.4 07/04/2024   PLT 205 07/04/2024     "

## 2024-07-03 NOTE — Assessment & Plan Note (Signed)
"   grade 3, ER+/PR+ -found on screening mammogram. S/p left lumpectomy on 02/25/21 with Dr. 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 Margins and lymph nodes negative. -her postoperative course was complicated by seroma and illness.  -s/p adjuvant radiation with Dr. Shannon 11/10-12/20/22. -she started tamoxifen  20mg  daily in mid 06/2021, reduced to 10 mg daily in 11/2021 due to arthralgia and nausea  -last mammogram 11/2021 at [facility] was negative, she would like to transfer to breast center going forward, I will order next mammogram there -Kelli Saunders is clinically doing well. Tolerating low dose tamoxifen  well. Recent breast exam 03/2022 by Dr. Curvin was benign. Labs are unremarkable.  -Overall no clinical concern for recurrence. Continue breast cancer surveillance and tamoxifen  10 mg daily -She will repeat mammogram and see Dr. Curvin in 6 months, then f/up with us  in 1 year -3D diagnostic mammogram done 12/14/2023.  She has breast density category B.  Overall results are benign. "

## 2024-07-04 ENCOUNTER — Inpatient Hospital Stay: Attending: Nurse Practitioner

## 2024-07-04 ENCOUNTER — Encounter: Payer: Self-pay | Admitting: Nurse Practitioner

## 2024-07-04 ENCOUNTER — Inpatient Hospital Stay: Admitting: Nurse Practitioner

## 2024-07-04 ENCOUNTER — Other Ambulatory Visit: Payer: Self-pay

## 2024-07-04 VITALS — BP 127/77 | HR 69 | Temp 98.0°F | Resp 16 | Wt 142.9 lb

## 2024-07-04 DIAGNOSIS — D0512 Intraductal carcinoma in situ of left breast: Secondary | ICD-10-CM

## 2024-07-04 LAB — CMP (CANCER CENTER ONLY)
ALT: 26 U/L (ref 0–44)
AST: 36 U/L (ref 15–41)
Albumin: 4.1 g/dL (ref 3.5–5.0)
Alkaline Phosphatase: 52 U/L (ref 38–126)
Anion gap: 9 (ref 5–15)
BUN: 13 mg/dL (ref 8–23)
CO2: 26 mmol/L (ref 22–32)
Calcium: 9.3 mg/dL (ref 8.9–10.3)
Chloride: 102 mmol/L (ref 98–111)
Creatinine: 0.77 mg/dL (ref 0.44–1.00)
GFR, Estimated: >60 mL/min
Glucose, Bld: 93 mg/dL (ref 70–99)
Potassium: 4.7 mmol/L (ref 3.5–5.1)
Sodium: 136 mmol/L (ref 135–145)
Total Bilirubin: 0.5 mg/dL (ref 0.0–1.2)
Total Protein: 6.4 g/dL — ABNORMAL LOW (ref 6.5–8.1)

## 2024-07-04 LAB — CBC WITH DIFFERENTIAL (CANCER CENTER ONLY)
Abs Immature Granulocytes: 0.01 K/uL (ref 0.00–0.07)
Basophils Absolute: 0.1 K/uL (ref 0.0–0.1)
Basophils Relative: 1 %
Eosinophils Absolute: 0.1 K/uL (ref 0.0–0.5)
Eosinophils Relative: 1 %
HCT: 42.8 % (ref 36.0–46.0)
Hemoglobin: 14.7 g/dL (ref 12.0–15.0)
Immature Granulocytes: 0 %
Lymphocytes Relative: 25 %
Lymphs Abs: 1.5 K/uL (ref 0.7–4.0)
MCH: 30.7 pg (ref 26.0–34.0)
MCHC: 34.3 g/dL (ref 30.0–36.0)
MCV: 89.4 fL (ref 80.0–100.0)
Monocytes Absolute: 0.6 K/uL (ref 0.1–1.0)
Monocytes Relative: 10 %
Neutro Abs: 3.9 K/uL (ref 1.7–7.7)
Neutrophils Relative %: 63 %
Platelet Count: 205 K/uL (ref 150–400)
RBC: 4.79 MIL/uL (ref 3.87–5.11)
RDW: 12.5 % (ref 11.5–15.5)
WBC Count: 6.1 K/uL (ref 4.0–10.5)
nRBC: 0 % (ref 0.0–0.2)

## 2024-07-05 ENCOUNTER — Other Ambulatory Visit: Payer: Self-pay | Admitting: Family Medicine

## 2024-07-05 DIAGNOSIS — Z1231 Encounter for screening mammogram for malignant neoplasm of breast: Secondary | ICD-10-CM

## 2024-08-14 ENCOUNTER — Encounter: Admitting: Nurse Practitioner

## 2024-12-17 ENCOUNTER — Ambulatory Visit

## 2025-07-03 ENCOUNTER — Inpatient Hospital Stay
